# Patient Record
Sex: Female | Born: 1935 | Race: White | Hispanic: No | Smoking: Never smoker
Health system: Southern US, Community
[De-identification: ages and names within clinical notes are randomized; demographics above are authoritative.]

## PROBLEM LIST (undated history)

## (undated) DIAGNOSIS — N3941 Urge incontinence: Secondary | ICD-10-CM

## (undated) DIAGNOSIS — N183 Chronic kidney disease, stage 3 (moderate): Secondary | ICD-10-CM

## (undated) DIAGNOSIS — I10 Essential (primary) hypertension: Secondary | ICD-10-CM

## (undated) DIAGNOSIS — E7801 Familial hypercholesterolemia: Secondary | ICD-10-CM

## (undated) DIAGNOSIS — M2550 Pain in unspecified joint: Secondary | ICD-10-CM

## (undated) DIAGNOSIS — I350 Nonrheumatic aortic (valve) stenosis: Secondary | ICD-10-CM

## (undated) DIAGNOSIS — R011 Cardiac murmur, unspecified: Secondary | ICD-10-CM

## (undated) DIAGNOSIS — E039 Hypothyroidism, unspecified: Secondary | ICD-10-CM

## (undated) DIAGNOSIS — M3500 Sicca syndrome, unspecified: Secondary | ICD-10-CM

## (undated) DIAGNOSIS — K219 Gastro-esophageal reflux disease without esophagitis: Secondary | ICD-10-CM

## (undated) DIAGNOSIS — Z9181 History of falling: Secondary | ICD-10-CM

## (undated) DIAGNOSIS — M5430 Sciatica, unspecified side: Secondary | ICD-10-CM

## (undated) DIAGNOSIS — D696 Thrombocytopenia, unspecified: Secondary | ICD-10-CM

## (undated) DIAGNOSIS — R05 Cough: Secondary | ICD-10-CM

## (undated) DIAGNOSIS — E079 Disorder of thyroid, unspecified: Secondary | ICD-10-CM

## (undated) DIAGNOSIS — M8580 Other specified disorders of bone density and structure, unspecified site: Secondary | ICD-10-CM

## (undated) DIAGNOSIS — C884 Extranodal marginal zone B-cell lymphoma of mucosa-associated lymphoid tissue [MALT-lymphoma]: Secondary | ICD-10-CM

## (undated) DIAGNOSIS — E785 Hyperlipidemia, unspecified: Secondary | ICD-10-CM

## (undated) DIAGNOSIS — D61818 Other pancytopenia: Secondary | ICD-10-CM

## (undated) HISTORY — DX: Sciatica, unspecified side: M54.30

## (undated) HISTORY — PX: LUMBAR DISC SURGERY: SHX700

## (undated) HISTORY — DX: Pain in unspecified joint: M25.50

## (undated) HISTORY — DX: Essential (primary) hypertension: I10

## (undated) HISTORY — DX: History of falling: Z91.81

## (undated) HISTORY — DX: Nonrheumatic aortic (valve) stenosis: I35.0

## (undated) HISTORY — DX: Thrombocytopenia, unspecified: D69.6

## (undated) HISTORY — PX: TONSILLECTOMY: SUR1361

## (undated) HISTORY — DX: Urge incontinence: N39.41

## (undated) HISTORY — DX: Hyperlipidemia, unspecified: E78.5

## (undated) HISTORY — DX: Other specified disorders of bone density and structure, unspecified site: M85.80

## (undated) HISTORY — PX: APPENDECTOMY: SHX54

## (undated) HISTORY — DX: Extranodal marginal zone B-cell lymphoma of mucosa-associated lymphoid tissue (MALT-lymphoma): C88.4

## (undated) HISTORY — DX: Gastro-esophageal reflux disease without esophagitis: K21.9

## (undated) HISTORY — DX: Hypothyroidism, unspecified: E03.9

## (undated) HISTORY — DX: Cough: R05

## (undated) HISTORY — DX: Chronic kidney disease, stage 3 (moderate): N18.3

## (undated) HISTORY — DX: Other pancytopenia: D61.818

## (undated) HISTORY — PX: ABDOMINAL HYSTERECTOMY: SHX81

## (undated) HISTORY — DX: Familial hypercholesterolemia: E78.01

---

## 1998-02-05 ENCOUNTER — Ambulatory Visit (HOSPITAL_COMMUNITY): Admission: RE | Admit: 1998-02-05 | Discharge: 1998-02-05 | Payer: Self-pay | Admitting: Ophthalmology

## 1998-02-05 ENCOUNTER — Encounter: Payer: Self-pay | Admitting: Ophthalmology

## 2006-06-26 ENCOUNTER — Ambulatory Visit: Payer: Self-pay | Admitting: Emergency Medicine

## 2006-07-01 ENCOUNTER — Ambulatory Visit: Payer: Self-pay | Admitting: Cardiovascular Disease

## 2006-08-03 ENCOUNTER — Ambulatory Visit: Payer: Self-pay | Admitting: Internal Medicine

## 2006-08-03 LAB — CONVERTED CEMR LAB
ALT: 11 units/L (ref 0–40)
Bilirubin, Direct: 0.1 mg/dL (ref 0.0–0.3)
Calcium: 9.7 mg/dL (ref 8.4–10.5)
Cortisol, Plasma: 13.5 ug/dL
GFR calc Af Amer: 70 mL/min
GFR calc non Af Amer: 58 mL/min
Glucose, Bld: 91 mg/dL (ref 70–99)
Hgb A1c MFr Bld: 5.4 % (ref 4.6–6.0)
Potassium: 4.8 meq/L (ref 3.5–5.1)
TSH: 3.92 microintl units/mL (ref 0.35–5.50)
Vitamin B-12: 951 pg/mL — ABNORMAL HIGH (ref 211–911)

## 2006-09-07 ENCOUNTER — Ambulatory Visit: Payer: Self-pay | Admitting: Internal Medicine

## 2006-12-09 ENCOUNTER — Ambulatory Visit: Payer: Self-pay | Admitting: Internal Medicine

## 2007-07-15 ENCOUNTER — Encounter: Admission: RE | Admit: 2007-07-15 | Discharge: 2007-07-15 | Payer: Self-pay | Admitting: Orthopedic Surgery

## 2008-07-26 ENCOUNTER — Encounter: Admission: RE | Admit: 2008-07-26 | Discharge: 2008-07-26 | Payer: Self-pay | Admitting: Family Medicine

## 2008-11-15 ENCOUNTER — Encounter: Admission: RE | Admit: 2008-11-15 | Discharge: 2008-11-15 | Payer: Self-pay | Admitting: Otolaryngology

## 2009-08-10 ENCOUNTER — Encounter: Admission: RE | Admit: 2009-08-10 | Discharge: 2009-08-10 | Payer: Self-pay | Admitting: Otolaryngology

## 2010-05-19 ENCOUNTER — Encounter: Payer: Self-pay | Admitting: Otolaryngology

## 2010-09-13 NOTE — Assessment & Plan Note (Signed)
[facility]                             PULMONARY OFFICE NOTE   SHANESSA, HODAK                      MRN:          [id number]  DATE:06/26/2006                            DOB:          1930-06-13    REASON FOR CONSULTATION:  This is a self-referral by Mrs. Oley to  review an abnormal CT scan of the chest.   BRIEF HISTORY:  Ms. Lizana is a 77 year old woman with a history of  non-Hodgkin's lymphoma, first diagnosed in 2001, and in remission  following treatment.  She also has a history of palpitations.  She tells  me that she has been evaluated by Dr. Gildardo Griffes for right flank and  back pain.  She also has had more difficulty with her palpitations,  beginning about a month ago, and part of her evaluation for this  included a CT scan of her chest.  This was done at [facility]  in [facility] and is available for review today.  There were scattered  areas of right mid-lung and right basilar scar with a slightly prominent  interstitium, no dominant lung nodules were identified.  There was some  peripheral lower lobe mild air space disease that could be consistent  with mild interstitial prominence or atelectasis.  She tells me that she  is not having any pulmonary symptoms.  She is very active, has no  breathing limitations, although her back pain can sometimes be limiting.  She denies any constitutional symptoms, including fevers, chills, weight  change.  She does not have cough.   PAST MEDICAL HISTORY:  1. Palpitations.  2. Non-Hodgkin's lymphoma, diagnosed in 2001 and in remission      currently.  3. Lumbar spine surgery in 1971.  4. Right-sided flank and back pain.   ALLERGIES:  No known drug allergies.   CURRENT MEDICATIONS:  1. Premarin daily.  2. Multivitamin once daily.   SOCIAL HISTORY:  The patient is divorced and lives by herself.  She owns  a cat.  She has owned dogs in the past, but has never owned birds.   She  formerly worked for the [employer] and was an Designer, television/film set.  She  also has been an Print production planner.  She denies any significant  occupational exposures.  She is a never-smoker and does not use alcohol.   FAMILY HISTORY:  Significant for cervical cancer in her maternal aunt.   REVIEW OF SYSTEMS:  As per the HPI.   PHYSICAL EXAM:  IN GENERAL:  This is a very pleasant, elderly woman, who  is in no distress on room air.  Weight 148 pounds, temperature 97.8,  blood pressure 112/62, heart rate 69, spO2 96% on room air.  HEENT EXAM:  The oropharynx is benign.  There is no posterior pharyngeal  erythema.  NECK:  The neck is supple without lymphadenopathy or stridor.  LUNGS:  Clear to auscultation bilaterally.  There is no wheezing on a  forced expiration.  HEART:  Has a regular rate and rhythm without murmur.  ABDOMEN:  Soft, nontender, nondistended.  Positive bowel sounds.  EXTREMITIES:  The extremities have no cyanosis, clubbing or edema.  NEUROLOGIC:  She has a grossly nonfocal exam.   The CT scan of the chest is available for review and is as detailed  above.   IMPRESSION:  Mild peripheral air space disease, possibly consistent with  atelectasis or scarring and some mild interstitial prominence on CT  scan, of unclear significance.  The patient is completely asymptomatic.   PLAN:  Given Ms. Burkemper's history of lymphoma, I believe that we do  need to confirm stability of these changes on her CAT scan in the  unlikely event that this represents lymphangitic spread.  I will perform  CT scan in six months to insure stability.  Also, we will try to obtain  her old films from 2005.  If her changes were present at that time, then  this would be very reassuring.  I will follow with Ms. Pires in six  months, after her CT is completed, or sooner should she have any  difficulty.     Leslye Peer, MD  Electronically Signed    RSB/MedQ  DD: 06/26/2006  DT: 06/26/2006   Job #: [id number]   cc:   Ivor Reining, Dr.  Kathie Rhodes, Ms.

## 2010-09-13 NOTE — Assessment & Plan Note (Signed)
[facility]                           PRIMARY CARE OFFICE NOTE   SHAYNE, DEERMAN                      MRN:          [id number]  DATE:08/03/2006                            DOB:          1930/11/07    CHIEF COMPLAINT:  New patient to practice.   HISTORY OF PRESENT ILLNESS:  The patient is a 77 year old female here to  establish primary care. She was formerly followed by Dr. Lawerance Bach in [place], [place]. She has recently seen several of our specialists,  first Dr. Delton Coombes in late February 2008 to review an abnormal CT of the  chest. She is reported to have non-Hodgkin's lymphoma  diagnosed in 2001  and currently on admission she is noted to have some mild peripheral air  space disease possibly consistent with atelectasis, scarring or some  mild interstitial prominence on CT scan of an unclear significance. The  patient was completely asymptomatic and Dr. Delton Coombes recommended followup  with a CAT scan in approximately 6 months.   She also saw Dr. Veverly Fells. Cooper in March 2008 secondary to  palpitations that have been ongoing x2 or 3 weeks associated with some  low back pain. She saw a specialist in [facility] who performed a stress  echo which noted normal contractile reserve, no cavity dilation noted  after exercise and left ventricular ejection fraction was noted to be  approximately 70%. She also had a Holter monitor which noted rare PACs  and PVCs, no arrhythmia were noted. The patient reported multiple  skipped beats and heart racing and most of them corresponding to normal  sinus rhythm was noted on Holter monitor.   One of her complaints today has been somewhat low blood pressure. She  also complains of some fatigue issues, blood pressure symptoms including  light-headedness especially associated with sitting to standing.   PAST MEDICAL HISTORY:  1. Non-Hodgkin's lymphoma  2001 status post treatment, currently in      remission.  2. History of chronic back pain status post L4-5 fusion.  3. History of tonsillectomy.  4. History of hysterectomy.  5. History of appendectomy.  6. History of parotid gland surgery.   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 She has one daughter that lives in  [place]. She never smoked or drank any alcohol.   FAMILY HISTORY:  No coronary artery disease. There is a brother who had  AAA repair in his mid 62s.   REVIEW OF SYSTEMS:  No fevers or chills. She is unsure whether she was  treated with steroids as part of her treatment for non-Hodgkin's  lymphoma.  No HEENT symptoms. The patient denies any chest pain or  shortness of breath, chronic constipation, some issues with bowel  incontinence in the past. Some issues with memory.   PHYSICAL EXAMINATION:  VITAL SIGNS:  Weight is 148.4 pounds, temperature  is 97, pulse is 63, blood pressure is 101/57 in sitting position and  drops to 90/50 with standing.  GENERAL:  The patient is a pleasant, well-developed, well-nourished,  54-  year-old, white female who appears her stated age.  HEENT:  Normocephalic, atraumatic. Pupils equal and reactive to light  bilaterally. Extraocular motility was intact. The patient was anicteric.  Conjunctiva was within normal limits. External auditory canals and  tympanic membranes were clear bilaterally. Oropharyngeal exam revealed  partial upper dental plate and full lower dental plate.  NECK:  Supple, no adenopathy, carotid bruit or thyromegaly.  CHEST:  Normal expiratory effort. Chest was clear to auscultation  bilaterally.  CARDIOVASCULAR:  Regular rate and rhythm. No significant murmurs, rubs  or gallops appreciated.  ABDOMEN:  Soft, nontender, positive bowel sounds. No organomegaly.  MUSCULOSKELETAL:  No clubbing, cyanosis or edema. The patient had intact  dorsalis pedis pulses.   IMPRESSION/RECOMMENDATIONS:  1. Fatigue with mild  orthostasis of unclear significance.  2. History of non-Hodgkin's lymphoma.  3. Palpitations.  4. History of chronic low back pain with L4-5 fusion.  5. Decreased memory.  6. Health maintenance.   RECOMMENDATIONS:  I will try to obtain records from her previous  oncologist. I am sure a CAT scan of her abdomen and pelvis has been  performed in the past. There is no note of AAA on recent CT scan of  chest.   In terms of her mild hypotension, I recommended liberal salt intake. She  is currently on a low salt diet and one consideration is possible  adrenal insufficiency to be further considered on followup.   In terms of her memory issues, I added a B12, folate and TSH to her labs  and if the patient continues to have memory issues, we will consider MRI  of the brain.   FOLLOWUP:  Approximately 4-6 weeks.     Barbette Hair. Artist Pais, DO  Electronically Signed    RDY/MedQ  DD: 08/03/2006  DT: 08/03/2006  Job #: [id number]

## 2010-09-13 NOTE — Assessment & Plan Note (Signed)
[facility]                            CARDIOLOGY OFFICE NOTE   CHELBIE, JARNAGIN                      MRN:          [id number]  DATE:07/01/2006                            DOB:          [birth year]    Barrett Henle presents in self-referral for evaluation of  palpitations.   HISTORY OF PRESENT ILLNESS:  Mrs. Denison is a 77 year old woman who  has no history of cardiac disease.  She describes an episode  approximately 2-3 weeks ago where she had shooting pains in her low back  area.  She associated with this cardiac palpitations.  She also was able  to hear her heartbeat in her ear.  Her palpitations were bothersome over  a one week period and have now resolved over the last two weeks.  She  has no other symptoms that she complains of.  She specifically denies  chest pain, dyspnea, lightheadedness, syncope, orthopnea or PND.  She  has no edema or calf claudication symptoms.  She is a very active woman  but is not engaged in any formal exercise.  She had no other associated  symptoms with her palpitations.   PAST MEDICAL HISTORY:  1. Non-Hodgkin's lymphoma status post chemotherapy approximately three      years ago.  She has been in remission since that time.  2. Chronic back pain, status post L4/L5 fusion.  3. Remote tonsillectomy.  4. Remote appendectomy.  5. Remote hysterectomy.  6. Parotid gland surgery.   There is no history of hypertension, diabetes, dyslipidemia or tobacco  abuse.   SOCIAL HISTORY:  The patient is retired from office work.  She worked  for [employer] and [employer].  She is divorced.  She has never smoked  cigarettes or drank alcohol.   FAMILY HISTORY:  There is a history of longevity in the family.  Her  father died at age 58 of old age and her mother died at age 39 of old  age.  She has a brother who had an abdominal aortic aneurysm repaired in  his mid 68's.   REVIEW OF SYSTEMS:  A complete 12-point review of  systems was performed.  Pertinent positive included only constipation and low back pain.  All  other systems were reviewed and are negative except as detailed.   PHYSICAL EXAMINATION:  GENERAL:  The patient is alert and oriented.  She  is in no acute distress.  VITAL SIGNS:  Weight 149 pounds, blood pressure 102/68, heart rate 64,  respirations 12.  HEENT:  Normal.  NECK:  Normal carotid upstrokes without bruits.  Jugular venous pressure  is normal.  No thyromegaly or thyroid nodules.  LUNGS:  Clear to auscultation bilaterally.  HEART:  The apex is discrete and nondisplaced.  There is no right  ventricular heave.  Regular rate and rhythm without murmurs or gallops.  ABDOMEN:  Soft, nontender, no organomegaly.  No abdominal bruits.  BACK:  There is no flank tenderness.  There is tenderness to palpation  over the lumbar spine.  EXTREMITIES:  No cyanosis, clubbing or edema.  Peripheral pulses are 2+  and  equal throughout.  SKIN:  Warm and dry without rash.  NEUROLOGICAL:  Cranial nerves II-XII are intact.  Strength is 5/5 and  equal in the arms and leg bilaterally.   STUDIES:  EKG shows normal sinus rhythm and is within normal limits.   Studies reviewed include an exercise stress echo from May 18, 2006.  This demonstrated normal lung contractile reserved for the left  ventricle with no cavity dilatation present.  LVEF with exercise was  70%.  Mrs. Walthour exercised for 5 minutes on the modified Bruce  protocol achieving 89% of her maximum predicted heart rate.  She had no  symptoms and the study was negative for ischemia.   A 24-hour Holter monitor, also performed on May 14, 2006,  demonstrated rare single PVC's as well as rare PAC's.  There were no  brady arrhythmias or significant arrhythmias.  Multiple symptoms were  recorded, of which most correlated to normal sinus rhythm.  Some  correlated with PAC's.   ASSESSMENT:  Mrs. Majette is a 77 year old woman with  palpitations  that have now mostly resolved.  She has been evaluated with both an  exercise stress echo as well as 24-hour Holter monitoring.  I think her  risk of having significant coronary artery disease is quite low with her  lack of cardiac risk factors as well as her lack of symptoms.  I also  think that her chance for having any serious heart rhythm problems is  low.  If she develops progressive palpitations, we could consider a  trial of low-dose beta blockade, but at this point with her improvement  in symptoms, I do not think any treatment is necessary.  I will ask her  to come back on a p.r.n. basis, and would be happy to see her if she  develops problems in the future.  She is going to establish care with  Dr. Artist Pais in the near future and I am sure he will check her fasting  lipids along with her other basic blood work.     Veverly Fells. Excell Seltzer, MD  Electronically Signed    MDC/MedQ  DD: 07/01/2006  DT: 07/01/2006  Job #: [id number]   cc:   Ivor Reining, M.D.  Barbette Hair. Artist Pais, DO  Leslye Peer, MD

## 2014-02-06 ENCOUNTER — Other Ambulatory Visit: Payer: Self-pay | Admitting: Orthopedic Surgery

## 2014-02-06 DIAGNOSIS — M48061 Spinal stenosis, lumbar region without neurogenic claudication: Secondary | ICD-10-CM

## 2014-02-06 DIAGNOSIS — M5416 Radiculopathy, lumbar region: Secondary | ICD-10-CM

## 2014-10-12 ENCOUNTER — Ambulatory Visit: Payer: Self-pay | Admitting: Cardiology

## 2014-10-26 ENCOUNTER — Ambulatory Visit: Payer: Self-pay | Admitting: Internal Medicine

## 2015-04-09 DIAGNOSIS — N183 Chronic kidney disease, stage 3 unspecified: Secondary | ICD-10-CM | POA: Insufficient documentation

## 2015-04-09 DIAGNOSIS — K219 Gastro-esophageal reflux disease without esophagitis: Secondary | ICD-10-CM

## 2015-04-09 DIAGNOSIS — E039 Hypothyroidism, unspecified: Secondary | ICD-10-CM

## 2015-04-09 DIAGNOSIS — E78019 Familial hypercholesterolemia, unspecified: Secondary | ICD-10-CM | POA: Insufficient documentation

## 2015-04-09 DIAGNOSIS — E7801 Familial hypercholesterolemia: Secondary | ICD-10-CM | POA: Insufficient documentation

## 2015-04-09 HISTORY — DX: Gastro-esophageal reflux disease without esophagitis: K21.9

## 2015-04-09 HISTORY — DX: Hypothyroidism, unspecified: E03.9

## 2015-04-09 HISTORY — DX: Chronic kidney disease, stage 3 unspecified: N18.30

## 2015-08-17 ENCOUNTER — Other Ambulatory Visit: Payer: Self-pay | Admitting: Orthopedic Surgery

## 2015-08-17 DIAGNOSIS — M48061 Spinal stenosis, lumbar region without neurogenic claudication: Secondary | ICD-10-CM

## 2015-08-24 ENCOUNTER — Other Ambulatory Visit: Payer: Self-pay | Admitting: Orthopedic Surgery

## 2015-08-24 ENCOUNTER — Ambulatory Visit
Admission: RE | Admit: 2015-08-24 | Discharge: 2015-08-24 | Disposition: A | Discharge status: Home / Self Care | Payer: Self-pay | Source: Ambulatory Visit | Attending: Orthopedic Surgery | Admitting: Orthopedic Surgery

## 2015-08-24 ENCOUNTER — Ambulatory Visit
Admission: RE | Admit: 2015-08-24 | Discharge: 2015-08-24 | Disposition: A | Discharge status: Home / Self Care | Payer: Medicare Other | Source: Ambulatory Visit | Attending: Orthopedic Surgery | Admitting: Orthopedic Surgery

## 2015-08-24 DIAGNOSIS — M48061 Spinal stenosis, lumbar region without neurogenic claudication: Secondary | ICD-10-CM

## 2015-08-24 MED ORDER — IOPAMIDOL (ISOVUE-M 200) INJECTION 41%
18.0000 mL | Freq: Once | INTRAMUSCULAR | Status: AC
  Administered 2015-08-24: 18 mL via INTRATHECAL

## 2015-08-24 NOTE — Progress Notes (Signed)
Pt states she has been off Tramadol for the past 2 days. 

## 2015-08-24 NOTE — Discharge Instructions (Signed)
Myelogram Discharge Instructions  1. Go home and rest quietly for the next 24 hours.  It is important to lie flat for the next 24 hours.  Get up only to go to the restroom.  You may lie in the bed or on a couch on your back, your stomach, your left side or your right side.  You may have one pillow under your head.  You may have pillows between your knees while you are on your side or under your knees while you are on your back.  2. DO NOT drive today.  Recline the seat as far back as it will go, while still wearing your seat belt, on the way home.  3. You may get up to go to the bathroom as needed.  You may sit up for 10 minutes to eat.  You may resume your normal diet and medications unless otherwise indicated.  Drink lots of extra fluids today and tomorrow.  4. The incidence of headache, nausea, or vomiting is about 5% (one in 20 patients).  If you develop a headache, lie flat and drink plenty of fluids until the headache goes away.  Caffeinated beverages may be helpful.  If you develop severe nausea and vomiting or a headache that does not go away with flat bed rest, call [phone].  5. You may resume normal activities after your 24 hours of bed rest is over; however, do not exert yourself strongly or do any heavy lifting tomorrow. If when you get up you have a headache when standing, go back to bed and force fluids for another 24 hours.  6. Call your physician for a follow-up appointment.  The results of your myelogram will be sent directly to your physician by the following day.  7. If you have any questions or if complications develop after you arrive home, please call [phone].  Discharge instructions have been explained to the patient.  The patient, or the person responsible for the patient, fully understands these instructions.       May resume Tramadol on August 25, 2015, after 1:00 pm.

## 2015-08-25 ENCOUNTER — Emergency Department (HOSPITAL_COMMUNITY)
Admission: EM | Admit: 2015-08-25 | Discharge: 2015-08-25 | Disposition: A | Discharge status: Home / Self Care | Payer: Medicare Other | Attending: Emergency Medicine | Admitting: Emergency Medicine

## 2015-08-25 ENCOUNTER — Encounter (HOSPITAL_COMMUNITY): Payer: Self-pay

## 2015-08-25 ENCOUNTER — Emergency Department (HOSPITAL_COMMUNITY): Payer: Medicare Other

## 2015-08-25 DIAGNOSIS — M5489 Other dorsalgia: Secondary | ICD-10-CM

## 2015-08-25 DIAGNOSIS — R55 Syncope and collapse: Secondary | ICD-10-CM | POA: Diagnosis not present

## 2015-08-25 DIAGNOSIS — R011 Cardiac murmur, unspecified: Secondary | ICD-10-CM | POA: Insufficient documentation

## 2015-08-25 DIAGNOSIS — Z8639 Personal history of other endocrine, nutritional and metabolic disease: Secondary | ICD-10-CM | POA: Insufficient documentation

## 2015-08-25 DIAGNOSIS — M545 Low back pain: Secondary | ICD-10-CM | POA: Diagnosis present

## 2015-08-25 HISTORY — DX: Disorder of thyroid, unspecified: E07.9

## 2015-08-25 HISTORY — DX: Cardiac murmur, unspecified: R01.1

## 2015-08-25 HISTORY — DX: Sjogren syndrome, unspecified: M35.00

## 2015-08-25 LAB — CBC WITH DIFFERENTIAL/PLATELET
BASOS ABS: 0 10*3/uL (ref 0.0–0.1)
BASOS PCT: 0 %
Eosinophils Absolute: 0 10*3/uL (ref 0.0–0.7)
Eosinophils Relative: 0 %
HEMATOCRIT: 38.7 % (ref 36.0–46.0)
HEMOGLOBIN: 13 g/dL (ref 12.0–15.0)
LYMPHS PCT: 11 %
Lymphs Abs: 0.6 10*3/uL — ABNORMAL LOW (ref 0.7–4.0)
MCH: 31.5 pg (ref 26.0–34.0)
MCHC: 33.6 g/dL (ref 30.0–36.0)
MCV: 93.7 fL (ref 78.0–100.0)
Monocytes Absolute: 0.4 10*3/uL (ref 0.1–1.0)
Monocytes Relative: 8 %
NEUTROS ABS: 4.5 10*3/uL (ref 1.7–7.7)
NEUTROS PCT: 81 %
Platelets: 134 10*3/uL — ABNORMAL LOW (ref 150–400)
RBC: 4.13 MIL/uL (ref 3.87–5.11)
RDW: 12.4 % (ref 11.5–15.5)
WBC: 5.6 10*3/uL (ref 4.0–10.5)

## 2015-08-25 LAB — URINALYSIS, ROUTINE W REFLEX MICROSCOPIC
Bilirubin Urine: NEGATIVE
GLUCOSE, UA: NEGATIVE mg/dL
Hgb urine dipstick: NEGATIVE
KETONES UR: 15 mg/dL — AB
NITRITE: NEGATIVE
PH: 8 (ref 5.0–8.0)
PROTEIN: NEGATIVE mg/dL
Specific Gravity, Urine: 1.011 (ref 1.005–1.030)

## 2015-08-25 LAB — BASIC METABOLIC PANEL
ANION GAP: 11 (ref 5–15)
BUN: 5 mg/dL — ABNORMAL LOW (ref 6–20)
CALCIUM: 9.1 mg/dL (ref 8.9–10.3)
CO2: 23 mmol/L (ref 22–32)
Chloride: 101 mmol/L (ref 101–111)
Creatinine, Ser: 0.86 mg/dL (ref 0.44–1.00)
GFR calc non Af Amer: >60 mL/min (ref >60)
GLUCOSE: 103 mg/dL — AB (ref 65–99)
POTASSIUM: 3.8 mmol/L (ref 3.5–5.1)
Sodium: 135 mmol/L (ref 135–145)

## 2015-08-25 LAB — URINE MICROSCOPIC-ADD ON
Bacteria, UA: NONE SEEN
RBC / HPF: NONE SEEN RBC/hpf (ref 0–5)

## 2015-08-25 MED ORDER — MORPHINE SULFATE (PF) 2 MG/ML IV SOLN
2.0000 mg | Freq: Once | INTRAVENOUS | Status: AC
  Administered 2015-08-25: 2 mg via INTRAVENOUS
  Filled 2015-08-25: qty 1

## 2015-08-25 MED ORDER — TRAMADOL HCL 50 MG PO TABS
50.0000 mg | ORAL_TABLET | Freq: Once | ORAL | Status: AC
  Administered 2015-08-25: 50 mg via ORAL
  Filled 2015-08-25: qty 1

## 2015-08-25 MED ORDER — ALPRAZOLAM 0.25 MG PO TABS: 0.2500 mg | ORAL_TABLET | Freq: Every evening | ORAL | Status: DC | PRN

## 2015-08-25 MED ORDER — SODIUM CHLORIDE 0.9 % IV SOLN
INTRAVENOUS | Status: DC
  Administered 2015-08-25: 10:00:00 via INTRAVENOUS

## 2015-08-25 MED ORDER — SODIUM CHLORIDE 0.9 % IV BOLUS (SEPSIS)
1000.0000 mL | Freq: Once | INTRAVENOUS | Status: AC
  Administered 2015-08-25: 1000 mL via INTRAVENOUS

## 2015-08-25 NOTE — ED Notes (Signed)
Pt. Asking for 50 mg tramadol that she takes at home for pain. Verbal order given by EDP.

## 2015-08-25 NOTE — ED Notes (Signed)
Pt. Coming from home via [facility] for near syncope. Pt. Had CT exam/spinal tap yesterday for ongoing lower back pain. Pt. Told to stay flat until noon today. Pt. sts she has been unable to eat or drink and everytime she goes to sit up, she almost passes out. Pt. Hx of thyroid dysfunction, sjogerns syndrome, and heart murmur. Pt. sts she feels like her ribs are broken from procedure yesterday. Pt. Rates pain 10/10. EMS reports 12-lead as unremarkable. Pt. Denies HA. Pt. Aox4.

## 2015-08-25 NOTE — ED Notes (Signed)
EDP at bedside  

## 2015-08-25 NOTE — ED Provider Notes (Signed)
CSN: [id number]     Arrival date & time 08/25/15  [id number] History   First MD Initiated Contact with Patient 08/25/15 [phone]     Chief Complaint  Patient presents with  . Near Syncope     (Consider location/radiation/quality/duration/timing/severity/associated sxs/prior Treatment) HPI Comments: Patient here after having near syncopal event while trying to go to the bathroom this morning. Patient had a abdominal CT with a lumbar puncture done yesterday due to ongoing back pain. Was told to lay flat and turning today. States that she has been unable to eat or drink for the past 24 hours. She stood up she felt dizzy lightheaded normals passed out. Denies any bowel or bladder function. No associated chest or abdominal discomfort. Pain is worse at her right flank and she denies any dysuria or hematuria. Pain is characterized as 10 of 10 and worse with movement better with rest. EMS called and patient transported here.  Patient is a 82 y.o. female presenting with near-syncope. The history is provided by the patient.  Near Syncope    Past Medical History  Diagnosis Date  . Sjogren's syndrome ([place])   . Thyroid disease   . Heart murmur    Past Surgical History  Procedure Laterality Date  . Tonsillectomy    . Appendectomy    . Abdominal hysterectomy    . Lumbar disc surgery     History reviewed. No pertinent family history. Social History  Substance Use Topics  . Smoking status: Never Smoker   . Smokeless tobacco: None  . Alcohol Use: No   OB History    Gravida Para Term Preterm AB TAB SAB Ectopic Multiple Living            1     Review of Systems  Cardiovascular: Positive for near-syncope.  All other systems reviewed and are negative.     Allergies  Review of patient's allergies indicates no known allergies.  Home Medications   Prior to Admission medications   Not on File   BP 141/56 mmHg  Pulse 64  Temp(Src) 98.1 F (36.7 C) (Oral)  Resp 16  Ht 5\' 7"  (1.702 m)  Wt  52.164 kg  BMI 18.01 kg/m2  SpO2 99% Physical Exam  Constitutional: She is oriented to person, place, and time. She appears well-developed and well-nourished.  Non-toxic appearance. No distress.  HENT:  Head: Normocephalic and atraumatic.  Eyes: Conjunctivae, EOM and lids are normal. Pupils are equal, round, and reactive to light.  Neck: Normal range of motion. Neck supple. No tracheal deviation present. No thyroid mass present.  Cardiovascular: Normal rate, regular rhythm and normal heart sounds.  Exam reveals no gallop.   No murmur heard. Pulmonary/Chest: Effort normal and breath sounds normal. No stridor. No respiratory distress. She has no decreased breath sounds. She has no wheezes. She has no rhonchi. She has no rales.  Abdominal: Soft. Normal appearance and bowel sounds are normal. She exhibits no distension. There is no tenderness. There is no rebound and no CVA tenderness.  Musculoskeletal: Normal range of motion. She exhibits no edema or tenderness.       Back:  Neurological: She is alert and oriented to person, place, and time. She has normal strength. No cranial nerve deficit or sensory deficit. GCS eye subscore is 4. GCS verbal subscore is 5. GCS motor subscore is 6.  Reflex Scores:      Patellar reflexes are 3+ on the right side and 3+ on the left side. Skin: Skin is warm  and dry. No abrasion and no rash noted.  Psychiatric: She has a normal mood and affect. Her speech is normal and behavior is normal.  Nursing note and vitals reviewed.   ED Course  Procedures (including critical care time) Labs Review Labs Reviewed  CBC WITH DIFFERENTIAL/PLATELET  BASIC METABOLIC PANEL  URINALYSIS, ROUTINE W REFLEX MICROSCOPIC (NOT AT [facility])    Imaging Review Ct Lumbar Spine W Contrast  08/24/2015  CLINICAL DATA:  Severe lumbosacral pain when lying down. Symptoms have worsened recently. EXAM: LUMBAR MYELOGRAM FLUOROSCOPY TIME:  0 minutes 51 seconds. 119.01 micro gray meter squared  PROCEDURE: After thorough discussion of risks and benefits of the procedure including bleeding, infection, injury to nerves, blood vessels, adjacent structures as well as headache and CSF leak, written and oral informed consent was obtained. Consent was obtained by Dr. Nelson Chimes. Time out form was completed. Patient was positioned prone on the fluoroscopy table. Local anesthesia was provided with 1% lidocaine without epinephrine after prepped and draped in the usual sterile fashion. Puncture was performed at L5-S1 using a 3 1/2 inch 22-gauge spinal needle via right para median approach. Using a single pass through the dura, the needle was placed within the thecal sac, with return of clear CSF. 15 mL of Isovue 200 was injected into the thecal sac, with normal opacification of the nerve roots and cauda equina consistent with free flow within the subarachnoid space. I personally performed the lumbar puncture and administered the intrathecal contrast. I also personally performed acquisition of the myelogram images. TECHNIQUE: Contiguous axial images were obtained through the Lumbar spine after the intrathecal infusion of infusion. Coronal and sagittal reconstructions were obtained of the axial image sets. COMPARISON:  MRI 02/27/2014 FINDINGS: LUMBAR MYELOGRAM FINDINGS: There is no compressive central canal stenosis. There is mild curvature convex to the left. There is narrowing of the lateral recesses at L4-5, left more than right. Small anterior extradural defect at that level. Standing flexion extension views do not show any abnormal motion. CT LUMBAR MYELOGRAM FINDINGS: Mild curvature convex to the left. There is chronic renal atrophy on the left with chronic hydroureteronephrosis. Distal ureter and bladder are not image. Right kidney appears normal without contrast. There is atherosclerosis of the aorta with mild aneurysmal dilatation, maximal diameter 2.8 cm. T12-L1: Bulging of the disc indents the ventral  subarachnoid space. No stenosis or neural compression. L1-2: Bulging of the disc indents the ventral aspect of the thecal sac. No stenosis or neural compression. Conus tip at lower L2. L2-3:  Normal interspace. L3-4: Mild bulging of the disc. Mild facet and ligamentous hypertrophy. Mild narrowing of the lateral recesses without visible neural compression. L4-5: Disc degeneration with loss height. Mild circumferential bulging of the disc. Herniation of some disc material and nitrogen gas into the foramen on the right that could focally affect the right L4 nerve root. Mild facet and ligamentous hypertrophy. L5-S1: Previous right hemilaminectomy and discectomy. Endplate osteophytes and bulging of the disc. Mild facet hypertrophy. No compressive narrowing of the canal or foramina. IMPRESSION: Mild curvature convex to the left. No likely significant finding at L1-2, L2-3, L3-4 or L5-S1. Previous right hemilaminectomy at L5-S1. At L4-5, there is disc space narrowing. There is extrusion of disc material and some nitrogen gas into the right foraminal to extra foraminal region that could focally affect the right L4 nerve root. This was present on the previous MRI but is slightly more prominent presently. Chronic left renal atrophy with hydroureteronephrosis. Infrarenal abdominal aortic ectasia maximal  transverse diameter 2.8 cm. Ectatic abdominal aorta at risk for aneurysm development. Recommend followup by ultrasound in 5 years. This recommendation follows ACR consensus guidelines: White Paper of the ACR Incidental Findings Committee II on Vascular Findings. J Am Coll Radiol 2013; 10:789-794. Electronically Signed   By: Nelson Chimes M.D.   On: 08/24/2015 14:00   Dg Myelography Lumbar Inj Lumbosacral  08/24/2015  CLINICAL DATA:  Severe lumbosacral pain when lying down. Symptoms have worsened recently. EXAM: LUMBAR MYELOGRAM FLUOROSCOPY TIME:  0 minutes 51 seconds. 119.01 micro gray meter squared PROCEDURE: After thorough  discussion of risks and benefits of the procedure including bleeding, infection, injury to nerves, blood vessels, adjacent structures as well as headache and CSF leak, written and oral informed consent was obtained. Consent was obtained by Dr. Nelson Chimes. Time out form was completed. Patient was positioned prone on the fluoroscopy table. Local anesthesia was provided with 1% lidocaine without epinephrine after prepped and draped in the usual sterile fashion. Puncture was performed at L5-S1 using a 3 1/2 inch 22-gauge spinal needle via right para median approach. Using a single pass through the dura, the needle was placed within the thecal sac, with return of clear CSF. 15 mL of Isovue 200 was injected into the thecal sac, with normal opacification of the nerve roots and cauda equina consistent with free flow within the subarachnoid space. I personally performed the lumbar puncture and administered the intrathecal contrast. I also personally performed acquisition of the myelogram images. TECHNIQUE: Contiguous axial images were obtained through the Lumbar spine after the intrathecal infusion of infusion. Coronal and sagittal reconstructions were obtained of the axial image sets. COMPARISON:  MRI 02/27/2014 FINDINGS: LUMBAR MYELOGRAM FINDINGS: There is no compressive central canal stenosis. There is mild curvature convex to the left. There is narrowing of the lateral recesses at L4-5, left more than right. Small anterior extradural defect at that level. Standing flexion extension views do not show any abnormal motion. CT LUMBAR MYELOGRAM FINDINGS: Mild curvature convex to the left. There is chronic renal atrophy on the left with chronic hydroureteronephrosis. Distal ureter and bladder are not image. Right kidney appears normal without contrast. There is atherosclerosis of the aorta with mild aneurysmal dilatation, maximal diameter 2.8 cm. T12-L1: Bulging of the disc indents the ventral subarachnoid space. No stenosis  or neural compression. L1-2: Bulging of the disc indents the ventral aspect of the thecal sac. No stenosis or neural compression. Conus tip at lower L2. L2-3:  Normal interspace. L3-4: Mild bulging of the disc. Mild facet and ligamentous hypertrophy. Mild narrowing of the lateral recesses without visible neural compression. L4-5: Disc degeneration with loss height. Mild circumferential bulging of the disc. Herniation of some disc material and nitrogen gas into the foramen on the right that could focally affect the right L4 nerve root. Mild facet and ligamentous hypertrophy. L5-S1: Previous right hemilaminectomy and discectomy. Endplate osteophytes and bulging of the disc. Mild facet hypertrophy. No compressive narrowing of the canal or foramina. IMPRESSION: Mild curvature convex to the left. No likely significant finding at L1-2, L2-3, L3-4 or L5-S1. Previous right hemilaminectomy at L5-S1. At L4-5, there is disc space narrowing. There is extrusion of disc material and some nitrogen gas into the right foraminal to extra foraminal region that could focally affect the right L4 nerve root. This was present on the previous MRI but is slightly more prominent presently. Chronic left renal atrophy with hydroureteronephrosis. Infrarenal abdominal aortic ectasia maximal transverse diameter 2.8 cm. Ectatic abdominal aorta at  risk for aneurysm development. Recommend followup by ultrasound in 5 years. This recommendation follows ACR consensus guidelines: White Paper of the ACR Incidental Findings Committee II on Vascular Findings. J Am Coll Radiol 2013; 10:789-794. Electronically Signed   By: Nelson Chimes M.D.   On: 08/24/2015 14:00   I have personally reviewed and evaluated these images and lab results as part of my medical decision-making.   EKG Interpretation None      MDM   Final diagnoses:  None    Patient given pain meds and IV fluids and feels better. CT scan without acute complications from her recent  myelogram. Will prescribe Xanax help her sleep and return precautions given. No focal neurological deficits noted at the time of this exam    Lacretia Leigh, MD 08/25/15 1209

## 2015-08-25 NOTE — ED Notes (Signed)
Hooked Pt back up to monitor. Pt inquiring about whether she can take her home medicines. RN notified.

## 2015-08-25 NOTE — Discharge Instructions (Signed)

## 2015-08-25 NOTE — ED Notes (Signed)
Pt. Sat up to take home meds (with EDP approval). Pt. Denies lightheadedness. Pt. Only given sips of water with meds. Pt. Laid back flat at this time.

## 2015-08-25 NOTE — ED Notes (Signed)
Pt. Transported to CT at this time.  

## 2015-08-25 NOTE — ED Notes (Signed)
Pt. Given food and beverage with EDP approval.

## 2015-08-25 NOTE — ED Notes (Signed)
EDP at bedside explaining discharge instructions.

## 2015-08-28 DIAGNOSIS — M3500 Sicca syndrome, unspecified: Secondary | ICD-10-CM | POA: Insufficient documentation

## 2015-08-28 DIAGNOSIS — I10 Essential (primary) hypertension: Secondary | ICD-10-CM | POA: Insufficient documentation

## 2015-08-28 DIAGNOSIS — M2550 Pain in unspecified joint: Secondary | ICD-10-CM | POA: Insufficient documentation

## 2015-08-28 DIAGNOSIS — R05 Cough: Secondary | ICD-10-CM | POA: Insufficient documentation

## 2015-08-28 DIAGNOSIS — E782 Mixed hyperlipidemia: Secondary | ICD-10-CM | POA: Insufficient documentation

## 2015-08-28 DIAGNOSIS — E785 Hyperlipidemia, unspecified: Secondary | ICD-10-CM | POA: Insufficient documentation

## 2015-08-28 DIAGNOSIS — R059 Cough, unspecified: Secondary | ICD-10-CM | POA: Insufficient documentation

## 2015-08-28 HISTORY — DX: Essential (primary) hypertension: I10

## 2015-08-28 HISTORY — DX: Cough, unspecified: R05.9

## 2015-10-10 DIAGNOSIS — C884 Extranodal marginal zone b-cell lymphoma of mucosa-associated lymphoid tissue (malt-lymphoma) not having achieved remission: Secondary | ICD-10-CM

## 2015-10-10 DIAGNOSIS — D61818 Other pancytopenia: Secondary | ICD-10-CM | POA: Insufficient documentation

## 2015-10-10 HISTORY — DX: Other pancytopenia: D61.818

## 2015-10-10 HISTORY — DX: Extranodal marginal zone B-cell lymphoma of mucosa-associated lymphoid tissue (MALT-lymphoma): C88.4

## 2015-10-10 HISTORY — DX: Extranodal marginal zone b-cell lymphoma of mucosa-associated lymphoid tissue (malt-lymphoma) not having achieved remission: C88.40

## 2016-05-06 DIAGNOSIS — N3941 Urge incontinence: Secondary | ICD-10-CM | POA: Insufficient documentation

## 2016-05-06 HISTORY — DX: Urge incontinence: N39.41

## 2016-07-14 DIAGNOSIS — Z9181 History of falling: Secondary | ICD-10-CM

## 2016-07-14 HISTORY — DX: History of falling: Z91.81

## 2017-03-09 DIAGNOSIS — D696 Thrombocytopenia, unspecified: Secondary | ICD-10-CM | POA: Insufficient documentation

## 2017-06-01 ENCOUNTER — Ambulatory Visit: Payer: Medicare Other | Admitting: Cardiology

## 2017-06-02 ENCOUNTER — Ambulatory Visit: Payer: Medicare Other | Admitting: Cardiology

## 2017-06-02 DIAGNOSIS — M8580 Other specified disorders of bone density and structure, unspecified site: Secondary | ICD-10-CM | POA: Insufficient documentation

## 2017-06-02 DIAGNOSIS — M5430 Sciatica, unspecified side: Secondary | ICD-10-CM

## 2017-06-02 HISTORY — DX: Other specified disorders of bone density and structure, unspecified site: M85.80

## 2017-06-02 HISTORY — DX: Sciatica, unspecified side: M54.30

## 2017-06-02 NOTE — Progress Notes (Signed)
Cardiology Office Note:    Date:  06/04/2017   ID:  Carmen Blackwell, DOB 11-15-1930, MRN [id number]  PCP:  Garwin Brothers, MD  Cardiologist:  Shirlee More, MD   Referring MD: No ref. provider found  ASSESSMENT:    1. Aortic stenosis, moderate   2. Sjogren's syndrome, with unspecified organ involvement ([place])   3. Essential hypertension   4. Non-Hodgkin's lymphoma, unspecified body region, unspecified non-Hodgkin lymphoma type ([place])   5. Familial hypercholesterolemia    PLAN:    In order of problems listed above:  1. By examination she has aortic stenosis asymptomatic at least moderate in severity.  She has had increased risk of cardiac involvement lymphoma although she has not had chest radiation.  I asked her to have a baseline echocardiogram performed in the severity will determine the frequency of subsequent echocardiograms.  If indeed the valve is stenotic will start endocarditis prophylaxis and I will see plan to see back in the office in 6 months or sooner if she develops chest pain shortness of breath or syncope. 2. Stable managed by rheumatology 3. Stable not presently on antihypertensive drug 4. Stable she is managed by oncology 5. Stable continue current treatment LDL is ideal  Next appointment 6 months   Medication Adjustments/Labs and Tests Ordered: Current medicines are reviewed at length with the patient today.  Concerns regarding medicines are outlined above.  Orders Placed This Encounter  Procedures  . EKG 12-Lead  . ECHOCARDIOGRAM COMPLETE   No orders of the defined types were placed in this encounter.    Chief Complaint  Patient presents with  . Follow-up    History of Present Illness:    Carmen Blackwell is a 84 y.o. female with a history of hypertension, hyperlipidemia. Sjogrens syndrome and MALT lymphoma who is being seen today for the evaluation of her cardiovascular risk at her request.  She relates she is noted to have a murmur when she has an urgent  care center and with her lymphoma she was concerned about cardiovascular risk.  She has been treated including Rituxinbut no radiation to the chest and at this time the lymphoma is in remission.  She has had no chest pain shortness of breath or syncope.  She has no background history of congenital or rheumatic heart disease.  She has never had issues like angina or myocardial infarction or heart failure.  She has hyperlipidemia well treated LDL at target.   Past Medical History:  Diagnosis Date  . Acquired hypothyroidism 04/09/2015  . Chronic kidney disease, stage III (moderate) ([place]) 04/09/2015  . Cough 08/28/2015  . Familial hypercholesterolemia 04/09/2015  . GERD (gastroesophageal reflux disease) 04/09/2015  . Heart murmur   . Hyperlipidemia 08/28/2015  . Hypertension 08/28/2015  . MALT lymphoma ([place]) 10/10/2015  . Osteopenia 06/02/2017  . Pain in joints 08/28/2015  . Pancytopenia ([place]) 10/10/2015  . Risk for falls 07/14/2016  . Sciatica 06/02/2017  . Sjogren's syndrome ([place])   . Thrombocytopenia ([place]) 03/09/2017  . Thyroid disease   . Urge incontinence 05/06/2016    Past Surgical History:  Procedure Laterality Date  . ABDOMINAL HYSTERECTOMY    . APPENDECTOMY    . LUMBAR DISC SURGERY    . TONSILLECTOMY      Current Medications: Current Meds  Medication Sig  . Calcium Carb-Cholecalciferol (CALCIUM 600+D3 PO) Take 1 capsule by mouth daily.  . hydroxychloroquine (PLAQUENIL) 200 MG tablet Take 1 tablet by mouth daily.  Marland Kitchen levothyroxine (SYNTHROID, LEVOTHROID) 50 MCG tablet Take 1  tablet by mouth daily.  Marland Kitchen lovastatin (MEVACOR) 20 MG tablet Take 1 tablet by mouth daily.  Marland Kitchen MAGNESIUM GLUCONATE PO Take 400 mg by mouth daily.   . Meclizine HCl 25 MG CHEW Chew 1 tablet by mouth as needed.  . mirabegron ER (MYRBETRIQ) 25 MG TB24 tablet Take 25 mg by mouth daily.  . potassium gluconate 595 (99 K) MG TABS tablet Take 595 mg by mouth daily.  . predniSONE (DELTASONE) 5 MG tablet Take 1 tablet by mouth  daily.  . traMADol (ULTRAM) 50 MG tablet Take 1 tablet by mouth as needed.     Allergies:   Nsaids; Oxybutynin; Requip [ropinirole hcl]; and Ropinirole   Social History   Socioeconomic History  . Marital status: Divorced    Spouse name: None  . Number of children: None  . Years of education: None  . Highest education level: None  Social Needs  . Financial resource strain: None  . Food insecurity - worry: None  . Food insecurity - inability: None  . Transportation needs - medical: None  . Transportation needs - non-medical: None  Occupational History  . None  Tobacco Use  . Smoking status: Never Smoker  . Smokeless tobacco: Never Used  Substance and Sexual Activity  . Alcohol use: No  . Drug use: No  . Sexual activity: None  Other Topics Concern  . None  Social History Narrative  . None     Family History: The patient's family history is negative for CAD, Heart attack, and Heart disease.  ROS:   ROS Please see the history of present illness.     All other systems reviewed and are negative.  EKGs/Labs/Other Studies Reviewed:    The following studies were reviewed today:   EKG:  EKG is  ordered today.  The ekg ordered today demonstrates [place] normal  Recent Labs: 03/09/2017 CMP is normal CBC is normal except for platelet count of 117,000 No results found for requested labs within last 8760 hours.  Recent Lipid Panel 09/03/2016 calculated LDL 78 No results found for: CHOL, TRIG, HDL, CHOLHDL, VLDL, LDLCALC, LDLDIRECT  Physical Exam:    VS:  BP (!) 112/58   Pulse 79   Resp 16   Ht 5\' 8"  (1.727 m)   Wt 120 lb 6.4 oz (54.6 kg)   BMI 18.31 kg/m     Wt Readings from Last 3 Encounters:  06/03/17 120 lb 6.4 oz (54.6 kg)  08/25/15 115 lb (52.2 kg)     GEN: thin, poor muscle mass looks somewhat chronically ill d in no acute distress HEENT: Normal NECK: No JVD; No carotid bruits LYMPHATICS: No lymphadenopathy CARDIAC: S1 is normal S2 appears single 3 of 6 mid  to late peaking systolic ejection murmur best heard in the aortic area radiates to the right clavicle into both carotid arteries.  Carotid upstroke is preserved there is no thrill no aortic regurgitation.  RRR,  RESPIRATORY:  Clear to auscultation without rales, wheezing or rhonchi  ABDOMEN: Soft, non-tender, non-distended MUSCULOSKELETAL:  No edema; No deformity  SKIN: Warm and dry NEUROLOGIC:  Alert and oriented x 3 PSYCHIATRIC:  Normal affect     Signed, Shirlee More, MD  06/04/2017 8:18 AM    [place]

## 2017-06-03 ENCOUNTER — Encounter: Payer: Self-pay | Admitting: Cardiology

## 2017-06-03 ENCOUNTER — Ambulatory Visit (INDEPENDENT_AMBULATORY_CARE_PROVIDER_SITE_OTHER): Payer: Medicare Other | Admitting: Cardiology

## 2017-06-03 VITALS — BP 112/58 | HR 79 | Resp 16 | Ht 68.0 in | Wt 120.4 lb

## 2017-06-03 DIAGNOSIS — C8590 Non-Hodgkin lymphoma, unspecified, unspecified site: Secondary | ICD-10-CM

## 2017-06-03 DIAGNOSIS — I350 Nonrheumatic aortic (valve) stenosis: Secondary | ICD-10-CM | POA: Insufficient documentation

## 2017-06-03 DIAGNOSIS — M3500 Sicca syndrome, unspecified: Secondary | ICD-10-CM | POA: Diagnosis not present

## 2017-06-03 DIAGNOSIS — E7801 Familial hypercholesterolemia: Secondary | ICD-10-CM

## 2017-06-03 DIAGNOSIS — E78019 Familial hypercholesterolemia, unspecified: Secondary | ICD-10-CM

## 2017-06-03 DIAGNOSIS — I10 Essential (primary) hypertension: Secondary | ICD-10-CM

## 2017-06-03 NOTE — Patient Instructions (Addendum)
Medication Instructions:  Your physician recommends that you continue on your current medications as directed. Please refer to the Current Medication list given to you today.  Labwork: None  Testing/Procedures: You had an EKG today.  Your physician has requested that you have an echocardiogram. Echocardiography is a painless test that uses sound waves to create images of your heart. It provides your doctor with information about the size and shape of your heart and how well your heart's chambers and valves are working. This procedure takes approximately one hour. There are no restrictions for this procedure.  Follow-Up: Your physician wants you to follow-up in: 6 months. You will receive a reminder letter in the mail two months in advance. If you don't receive a letter, please call our office to schedule the follow-up appointment.  Any Other Special Instructions Will Be Listed Below (If Applicable).     If you need a refill on your cardiac medications before your next appointment, please call your pharmacy.    Heart Murmur A heart murmur is an extra sound that is caused by chaotic blood flow. The murmur can be heard as a "hum" or "whoosh" sound when blood flows through the heart. The heart has four areas called chambers. Valves separate the upper and lower chambers from each other (tricuspid valve and mitral valve) and separate the lower chambers of the heart from pathways that lead away from the heart (aortic valve and pulmonary valve). Normally, the valves open to let blood flow through or out of your heart, and then they shut to keep the blood from flowing backward. There are two types of heart murmurs:  Innocent murmurs. Most people with this type of heart murmur do not have a heart problem. Many children have innocent heart murmurs. Your health care provider may suggest some basic testing to find out whether your murmur is an innocent murmur. If an innocent heart murmur is found,  there is no need for further tests or treatment and no need to restrict activities or stop playing sports.  Abnormal murmurs. These types of murmurs can occur in children and adults. Abnormal murmurs may be a sign of a more serious heart condition, such as a heart defect present at birth (congenital defect) or heart valve disease.  What are the causes? This condition is caused by heart valves that are not working properly. In children, abnormal heart murmurs are typically caused by congenital defects. In adults, abnormal murmurs are usually from heart valve problems caused by disease, infection, or aging. Three types of heart valve defects can cause a murmur:  Regurgitation. This is when blood leaks back through the valve in the wrong direction.  Mitral valve prolapse. This is when the mitral valve of the heart has a loose flap and does not close tightly.  Stenosis. This is when a valve does not open enough and blocks blood flow.  This condition may also be caused by:  Pregnancy.  Fever.  Overactive thyroid gland.  Anemia.  Exercise.  Rapid growth spurts (in children).  What are the signs or symptoms? Innocent murmurs do not cause symptoms, and many people with abnormal murmurs may or may not have symptoms. If symptoms do develop, they may include:  Shortness of breath.  Blue coloring of the skin, especially on the fingertips.  Chest pain.  Palpitations, or feeling a fluttering or skipped heartbeat.  Fainting.  Persistent cough.  Getting tired much faster than expected.  Swelling in the abdomen, feet, or ankles.  How  is this diagnosed? This condition may be diagnosed during a routine physical or other exam. If your health care provider hears a murmur with a stethoscope, he or she will listen for:  Where the murmur is located in your heart.  How long the murmur lasts (duration).  When the murmur is heard during the heartbeat.  How loud the murmur is. This may  help the health care provider figure out what is causing the murmur.  You may be referred to a heart specialist (cardiologist). You may also have other tests, including:  Electrocardiogram (ECG or EKG). This test measures the electrical activity of your heart.  Echocardiogram. This test uses high frequency sound waves to make pictures of your heart.  MRI or chest X-ray.  Cardiac catheterization. This test looks at blood flow through the heart.  For children and adults who have an abnormal heart murmur and want to stay active, it is important to complete testing, review test results, and receive recommendations from your health care provider. If heart disease is present, it may not be safe to play or be active. How is this treated? Heart murmurs themselves do not need treatment. In some cases, a heart murmur may go away on its own. If an underlying problem or disease is causing the murmur, you may need treatment. If treatment is needed, it will depend on the type and severity of the disease or heart problem causing the murmur. Treatment may include:  Medicine.  Surgery.  Dietary and lifestyle changes.  Follow these instructions at home:  Talk with your health care provider before participating in sports or other activities that require a lot of effort and energy (are strenuous).  Learn as much as possible about your condition and any related diseases. Ask your health care provider if you may at risk for any medical emergencies.  Talk with your health care provider about what symptoms you should look out for.  It is up to you to get your test results. Ask your health care provider, or the department that is doing the test, when your results will be ready.  Keep all follow-up visits as told by your health care provider. This is important. Contact a health care provider if:  You feel light-headed.  You are frequently short of breath.  You feel more tired than usual.  You are  having a hard time keeping up with normal activities or fitness routines.  You have swelling in your ankles or feet.  You have chest pain.  You notice that your heart often beats irregularly.  You develop any new symptoms. Get help right away if:  You develop severe chest pain.  You are having trouble breathing.  You have fainting spells.  Your symptoms suddenly get worse. These symptoms may represent a serious problem that is an emergency. Do not wait to see if the symptoms will go away. Get medical help right away. Call your local emergency services (911 in the [place] Do not drive yourself to the hospital. Summary  Normally, the heart valves open to let blood flow through or out of your heart, and then they shut to keep the blood from flowing backward.  Heart murmur is caused by heart valves that are not working properly.  You may need treatment if an underlying problem or disease is causing the heart murmur. Treatment may include medicine, surgery, or dietary and lifestyle changes.  Talk with your health care provider before participating in sports or other activities that require a  lot of effort and energy (are strenuous).  Talk with your health care provider about what symptoms you should watch out for. This information is not intended to replace advice given to you by your health care provider. Make sure you discuss any questions you have with your health care provider. Document Released: 05/22/2004 Document Revised: 04/02/2016 Document Reviewed: 04/02/2016 Elsevier Interactive Patient Education  [employer].

## 2017-06-04 ENCOUNTER — Encounter: Payer: Self-pay | Admitting: Cardiology

## 2017-06-11 ENCOUNTER — Telehealth: Payer: Self-pay

## 2017-06-11 NOTE — Telephone Encounter (Signed)
Patient advised echocardiogram appointment at [facility] scheduled for 06/15/17 at 1 pm, patient to arrive at 12:30 pm. Patient verbalized understanding. No further questions.

## 2017-06-17 DIAGNOSIS — I350 Nonrheumatic aortic (valve) stenosis: Secondary | ICD-10-CM | POA: Diagnosis not present

## 2017-06-22 ENCOUNTER — Telehealth: Payer: Self-pay

## 2017-06-22 ENCOUNTER — Other Ambulatory Visit: Payer: Self-pay

## 2017-06-22 DIAGNOSIS — I350 Nonrheumatic aortic (valve) stenosis: Secondary | ICD-10-CM

## 2017-06-22 NOTE — Telephone Encounter (Signed)
Left message to return call for echocardiogram results performed at [facility]. Per Dr. Bettina Gavia, patient has moderate atriac valve stenosis. Moving recall from August, 2019 to May, 2019 per Dr. Bettina Gavia. Will advise patient when she returns call.

## 2017-06-22 NOTE — Telephone Encounter (Signed)
Patient informed of results. Patient advised recall date moved up to May, 2019. Patient verbalized understanding.

## 2017-08-04 ENCOUNTER — Encounter: Payer: Self-pay | Admitting: Neurology

## 2017-08-07 ENCOUNTER — Ambulatory Visit (INDEPENDENT_AMBULATORY_CARE_PROVIDER_SITE_OTHER): Payer: Medicare Other | Admitting: Neurology

## 2017-08-07 ENCOUNTER — Encounter: Payer: Self-pay | Admitting: Neurology

## 2017-08-07 VITALS — BP 110/68 | HR 80 | Ht 68.0 in | Wt 119.1 lb

## 2017-08-07 DIAGNOSIS — G8929 Other chronic pain: Secondary | ICD-10-CM

## 2017-08-07 DIAGNOSIS — M545 Low back pain, unspecified: Secondary | ICD-10-CM

## 2017-08-07 DIAGNOSIS — G63 Polyneuropathy in diseases classified elsewhere: Secondary | ICD-10-CM | POA: Diagnosis not present

## 2017-08-07 DIAGNOSIS — M3509 Sicca syndrome with other organ involvement: Secondary | ICD-10-CM

## 2017-08-07 HISTORY — DX: Other chronic pain: G89.29

## 2017-08-07 MED ORDER — TIZANIDINE HCL 2 MG PO TABS: 2.0000 mg | ORAL_TABLET | Freq: Every evening | ORAL | 3 refills | Status: DC | PRN

## 2017-08-07 NOTE — Progress Notes (Signed)
[address] [facility] Clinic Note - Initial Visit   Date: 08/07/17  Carmen Blackwell MRN: [id number] DOB: 1931-01-17   Dear Hervey Ard, FNP:  Thank you for your kind referral of Carmen Blackwell for consultation of low back pain. Although her history is well known to you, please allow [place] to reiterate it for the purpose of our medical record. The patient was accompanied to the clinic by self.   History of Present Illness: Carmen Blackwell is a 84 y.o. right-handed Caucasian female with Sjogren's syndrome, GERD, hyperlipidemia, hypothyroidism MALT lymphoma (2004), thrombocytopenia, aortic stenosis presenting for evaluation of low back pain.    She has had low back pain dating back into early 1970s and had hemilaminectomy at L5-S1 in 1971.  She was doing well until ~ 2010 and developed low back pain again.  Pain is worse at night, described as throbbing with "firey pain" which radiates into her right hip, but does not radiate into her thigh or leg. Repositioning can alleviate her pain.  Pain is not present when upright, standing, or walking. She saw Dr. Gladstone Lighter for low back pain and last CT myelogram in 2017 showed multilevel disc bulge and disc space narrowing at L4-5, with possible L4 nerve encroachment on the right.  She was referred for physical therapy 2 year ago, and did not appreciate any benefit.  She has not had any epidural steroid injections. She did not tolerate gabapentin 100mg .  She complains of "shakiness" of the legs and walks with a cane since 2018.  She has not had any falls.  She complains of intermittent spells of numbness and tingling of the legs.    She also saw Dr. Sabra Heck at [facility] in March 2018 for neck pain.  She was given gabapentin, but developed side effects.   Out-side paper records, electronic medical record, and images have been reviewed where available and summarized as:  CT lumbar spine 08/24/2015: Mild curvature convex  to the left. No likely significant finding at L1-2, L2-3, L3-4 or L5-S1. Previous right hemilaminectomy at L5-S1. At L4-5, there is disc space narrowing. There is extrusion of disc material and some nitrogen gas into the right foraminal to extra foraminal region that could focally affect the right L4 nerve root. This was present on the previous MRI but is slightly more prominent presently.  Chronic left renal atrophy with hydroureteronephrosis.  Infrarenal abdominal aortic ectasia maximal transverse diameter 2.8 cm. Ectatic abdominal aorta at risk for aneurysm development. Recommend followup by ultrasound in 5 years. This recommendation follows ACR consensus guidelines: White Paper of the ACR Incidental Findings Committee II on Vascular Findings. J Am Coll Radiol 2013; 10:789-794.   Past Medical History:  Diagnosis Date  . Acquired hypothyroidism 04/09/2015  . Chronic kidney disease, stage III (moderate) ([place]) 04/09/2015  . Cough 08/28/2015  . Familial hypercholesterolemia 04/09/2015  . GERD (gastroesophageal reflux disease) 04/09/2015  . Heart murmur   . Hyperlipidemia 08/28/2015  . Hypertension 08/28/2015  . MALT lymphoma ([place]) 10/10/2015  . Osteopenia 06/02/2017  . Pain in joints 08/28/2015  . Pancytopenia ([place]) 10/10/2015  . Risk for falls 07/14/2016  . Sciatica 06/02/2017  . Sjogren's syndrome ([place])   . Thrombocytopenia ([place]) 03/09/2017  . Thyroid disease   . Urge incontinence 05/06/2016    Past Surgical History:  Procedure Laterality Date  . ABDOMINAL HYSTERECTOMY    . APPENDECTOMY    . LUMBAR DISC SURGERY    . TONSILLECTOMY       Medications:  Outpatient  Encounter Medications as of 08/07/2017  Medication Sig  . aspirin EC 81 MG tablet Take by mouth.  . Calcium Carb-Cholecalciferol (CALCIUM 600+D3 PO) Take 1 capsule by mouth daily.  . hydroxychloroquine (PLAQUENIL) 200 MG tablet Take 1 tablet by mouth daily.  Marland Kitchen levothyroxine (SYNTHROID, LEVOTHROID) 50 MCG tablet Take 1 tablet by  mouth daily.  Marland Kitchen lovastatin (MEVACOR) 20 MG tablet Take 1 tablet by mouth daily.  Marland Kitchen MAGNESIUM GLUCONATE PO Take 400 mg by mouth daily.   . Meclizine HCl 25 MG CHEW Chew 1 tablet by mouth as needed.  . meloxicam (MOBIC) 7.5 MG tablet Take 7.5 mg by mouth daily.  . mirabegron ER (MYRBETRIQ) 25 MG TB24 tablet Take 25 mg by mouth daily.  . potassium gluconate 595 (99 K) MG TABS tablet Take 595 mg by mouth daily.  . predniSONE (DELTASONE) 5 MG tablet Take 1 tablet by mouth daily.  . traMADol (ULTRAM) 50 MG tablet Take 1 tablet by mouth as needed.  Marland Kitchen tiZANidine (ZANAFLEX) 2 MG tablet Take 1 tablet (2 mg total) by mouth at bedtime as needed (low back pain).   No facility-administered encounter medications on file as of 08/07/2017.      Allergies:  Allergies  Allergen Reactions  . Nsaids   . Oxybutynin Other (See Comments)    dizziness dizziness   . Requip [Ropinirole Hcl]   . Ropinirole Other (See Comments)    Other reaction(s): Hypotension (ALLERGY/intolerance) Other reaction(s): Hypotension (ALLERGY/intolerance)     Family History: Family History  Problem Relation Age of Onset  . Other Mother        Died at 56  . Heart failure Brother        Died at 29  . CAD Neg Hx        Negative family history  . Heart attack Neg Hx   . Heart disease Neg Hx     Social History: Social History   Tobacco Use  . Smoking status: Never Smoker  . Smokeless tobacco: Never Used  Substance Use Topics  . Alcohol use: No  . Drug use: No   Social History   Social History Narrative   Lives alone in a 2 story home.  Has one child.  Retired.  Education: some college.     Review of Systems:  CONSTITUTIONAL: No fevers, chills, night sweats, or weight loss.   EYES: No visual changes or eye pain ENT: No hearing changes.  No history of nose bleeds.   RESPIRATORY: No cough, wheezing and shortness of breath.   CARDIOVASCULAR: Negative for chest pain, and palpitations.   GI: Negative for  abdominal discomfort, blood in stools or black stools.  No recent change in bowel habits.   GU:  No history of incontinence.   MUSCLOSKELETAL: +history of joint pain or swelling.  +myalgias.   SKIN: Negative for lesions, rash, and itching.   HEMATOLOGY/ONCOLOGY: Negative for prolonged bleeding, bruising easily, and swollen nodes.  No history of cancer.   ENDOCRINE: Negative for cold or heat intolerance, polydipsia or goiter.   PSYCH:  No depression or anxiety symptoms.   NEURO: As Above.   Vital Signs:  BP 110/68   Pulse 80   Ht 5\' 8"  (1.727 m)   Wt 119 lb 2 oz (54 kg)   SpO2 98%   BMI 18.11 kg/m   General Medical Exam:   General:  Frail appearing, comfortable.   Eyes/ENT: see cranial nerve examination.   Neck: No masses appreciated.  Full range  of motion without tenderness.  No carotid bruits. Respiratory:  Clear to auscultation, good air entry bilaterally.   Cardiac:  Regular rate and rhythm, systolic murmur.   Extremities:  No deformities, edema, or skin discoloration.  Skin:  No rashes or lesions.  Neurological Exam: MENTAL STATUS including orientation to time, place, person, recent and remote memory, attention span and concentration, language, and fund of knowledge is normal.  Speech is not dysarthric.  CRANIAL NERVES: II:  No visual field defects.  Unremarkable fundi.   III-IV-VI: Pupils equal round and reactive to light.  Normal conjugate, extra-ocular eye movements in all directions of gaze.  No nystagmus.  No ptosis.   V:  Normal facial sensation.   VII:  Normal facial symmetry and movements.  VIII:  Normal hearing and vestibular function.   IX-X:  Normal palatal movement.   XI:  Normal shoulder shrug and head rotation.   XII:  Normal tongue strength and range of motion, no deviation or fasciculation.  MOTOR:  No atrophy, fasciculations or abnormal movements.  No pronator drift.  Tone is normal.    Right Upper Extremity:    Left Upper Extremity:    Deltoid  5/5    Deltoid  5/5   Biceps  5/5   Biceps  5/5   Triceps  5/5   Triceps  5/5   Wrist extensors  5/5   Wrist extensors  5/5   Wrist flexors  5/5   Wrist flexors  5/5   Finger extensors  5/5   Finger extensors  5/5   Finger flexors  5/5   Finger flexors  5/5   Dorsal interossei  5/5   Dorsal interossei  5/5   Abductor pollicis  5/5   Abductor pollicis  5/5   Tone (Ashworth scale)  0  Tone (Ashworth scale)  0   Right Lower Extremity:    Left Lower Extremity:    Hip flexors  5/5   Hip flexors  5/5   Hip extensors  5/5   Hip extensors  5/5   Knee flexors  5/5   Knee flexors  5/5   Knee extensors  5/5   Knee extensors  5/5   Dorsiflexors  5/5   Dorsiflexors  5/5   Plantarflexors  5/5   Plantarflexors  5/5   Toe extensors  5/5   Toe extensors  5/5   Toe flexors  5/5   Toe flexors  5/5   Tone (Ashworth scale)  0  Tone (Ashworth scale)  0   MSRs:  Right                                                                 Left brachioradialis 2+  brachioradialis 2+  biceps 2+  biceps 2+  triceps 2+  triceps 2+  patellar 3+  patellar 3+  ankle jerk 0  ankle jerk 0  Hoffman no  Hoffman no  plantar response down  plantar response down   SENSORY:  Vibration is absent distal to ankles bilaterally.  Temperature and pin prick intact.  Rhomberg sign is present.  COORDINATION/GAIT: Normal finger-to- nose-finger.  Intact rapid alternating movements bilaterally. Gait is wide-based ataxic, unsteady even with a cane.   IMPRESSION: Chronic low back pain from degenerative arthritis  and possible lumbar radiculopathy.  CT lumbar spine was viewed with patient which shows mild multilevel disc bulge, L4-5 disc space narrowing, and mild right L4 nerve encroachment.  She describes lumbar radicular pain only when supine, which does not involve the L4 dermatome. I agree with Dr. Gladstone Lighter that there is nothing surgically to decompress.  She had many questions about her symptoms, imaging, and medications which I answered to  the best of my ability.  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  Greater than 50% of this time was spent in counseling, explanation of diagnosis, planning of further management, and coordination of care.   Thank you for allowing me to participate in patient's care.  If I can answer any additional questions, I would be pleased to do so.    Sincerely,    Elzia Hott K. Posey Pronto, DO

## 2017-08-07 NOTE — Patient Instructions (Addendum)
Start physical therapy at [facility] for low back pain  Start tizanidine 2mg  at bedtime for low back pain  Recommend using a rollator for balance  Return to clinic in 6 months

## 2017-08-11 ENCOUNTER — Telehealth: Payer: Self-pay | Admitting: Neurology

## 2017-08-11 NOTE — Telephone Encounter (Signed)
Pt left a VM message saying [facility] has not received the referral yet

## 2017-08-11 NOTE — Telephone Encounter (Signed)
Referral was just sent in this morning.

## 2017-11-05 ENCOUNTER — Encounter: Payer: Self-pay | Admitting: Cardiology

## 2017-11-05 ENCOUNTER — Ambulatory Visit (INDEPENDENT_AMBULATORY_CARE_PROVIDER_SITE_OTHER): Payer: Medicare Other | Admitting: Cardiology

## 2017-11-05 VITALS — BP 110/62 | HR 70 | Ht 68.0 in | Wt 117.0 lb

## 2017-11-05 DIAGNOSIS — I10 Essential (primary) hypertension: Secondary | ICD-10-CM

## 2017-11-05 DIAGNOSIS — H539 Unspecified visual disturbance: Secondary | ICD-10-CM | POA: Diagnosis not present

## 2017-11-05 DIAGNOSIS — I350 Nonrheumatic aortic (valve) stenosis: Secondary | ICD-10-CM

## 2017-11-05 DIAGNOSIS — E7800 Pure hypercholesterolemia, unspecified: Secondary | ICD-10-CM | POA: Diagnosis not present

## 2017-11-05 MED ORDER — LOVASTATIN 20 MG PO TABS: 20.0000 mg | ORAL_TABLET | Freq: Every day | ORAL | 6 refills | Status: DC

## 2017-11-05 NOTE — Progress Notes (Signed)
Cardiology Office Note:    Date:  11/05/2017   ID:  Carmen Blackwell, DOB [birth year], MRN [id number]  PCP:  Patient, No Pcp Per  Cardiologist:  Shirlee More, MD    Referring MD: Garwin Brothers, MD    ASSESSMENT:    1. Aortic stenosis, moderate   2. Essential hypertension   3. Pure hypercholesterolemia   4. Vision disturbance    PLAN:    In order of problems listed above:  1. By symptoms and exam stable at risk for progression recheck echocardiogram and a severe stenosis would require further evaluation or consider ablation of valve intervention surgical or TAVR 2. Stable blood pressure target presently not on hypertensive agents 3. Poorly controlled she had run out of her statin she will resume check baseline liver function lipid profile 4. Symptoms are concerning for amaurosis fugax check carotid duplex I encouraged her to discuss the symptoms next time she is seen by ophthalmology   Next appointment: 6 months   Medication Adjustments/Labs and Tests Ordered: Current medicines are reviewed at length with the patient today.  Concerns regarding medicines are outlined above.  Orders Placed This Encounter  Procedures  . Comprehensive Metabolic Panel (CMET)  . Lipid Profile  . ECHOCARDIOGRAM COMPLETE   Meds ordered this encounter  Medications  . lovastatin (MEVACOR) 20 MG tablet    Sig: Take 1 tablet (20 mg total) by mouth daily.    Dispense:  30 tablet    Refill:  6    Chief Complaint  Patient presents with  . Aortic Stenosis    History of Present Illness:    Carmen Blackwell is a 84 y.o. female with a hx of moderate, P/M gradients 43/25 mm Hg, aortic stenosis, hypertension, hyperlipidemia. Sjogrens syndrome and MALT lymphoma  in remission since 2006 last seen 06/03/17. Compliance with diet, lifestyle and medications: Yes  She was seen in the office in follow-up for aortic stenosis no fever chills shortness of breath chest pain or syncope.  She is followed regularly by  ophthalmology has not discussed with them but tells me she intermittently has visual loss and inquires whether she needs a carotid duplex performed.  Other complaints include chronic back pain muscle pain difficulty with balance and walking and easy bruising Past Medical History:  Diagnosis Date  . Acquired hypothyroidism 04/09/2015  . Chronic kidney disease, stage III (moderate) ([place]) 04/09/2015  . Cough 08/28/2015  . Familial hypercholesterolemia 04/09/2015  . GERD (gastroesophageal reflux disease) 04/09/2015  . Heart murmur   . Hyperlipidemia 08/28/2015  . Hypertension 08/28/2015  . MALT lymphoma ([place]) 10/10/2015  . Osteopenia 06/02/2017  . Pain in joints 08/28/2015  . Pancytopenia ([place]) 10/10/2015  . Risk for falls 07/14/2016  . Sciatica 06/02/2017  . Sjogren's syndrome ([place])   . Thrombocytopenia ([place]) 03/09/2017  . Thyroid disease   . Urge incontinence 05/06/2016    Past Surgical History:  Procedure Laterality Date  . ABDOMINAL HYSTERECTOMY    . APPENDECTOMY    . LUMBAR DISC SURGERY    . TONSILLECTOMY      Current Medications: Current Meds  Medication Sig  . Calcium Carb-Cholecalciferol (CALCIUM 600+D3 PO) Take 1 capsule by mouth daily.  . hydroxychloroquine (PLAQUENIL) 200 MG tablet Take 1 tablet by mouth daily.  Marland Kitchen levothyroxine (SYNTHROID, LEVOTHROID) 50 MCG tablet Take 1 tablet by mouth daily.  Marland Kitchen lovastatin (MEVACOR) 20 MG tablet Take 1 tablet (20 mg total) by mouth daily.  Marland Kitchen MAGNESIUM GLUCONATE PO Take 400 mg by mouth daily.   Marland Kitchen  Meclizine HCl 25 MG CHEW Chew 1 tablet by mouth as needed.  . meloxicam (MOBIC) 7.5 MG tablet Take 7.5 mg by mouth daily.  . mirabegron ER (MYRBETRIQ) 25 MG TB24 tablet Take 25 mg by mouth daily.  . potassium gluconate 595 (99 K) MG TABS tablet Take 595 mg by mouth as needed.   . predniSONE (DELTASONE) 5 MG tablet Take 1 tablet by mouth daily.  . traMADol (ULTRAM) 50 MG tablet Take 1 tablet by mouth as needed.  . [DISCONTINUED] lovastatin (MEVACOR) 20 MG  tablet Take 1 tablet by mouth daily.     Allergies:   Nsaids; Oxybutynin; Requip [ropinirole hcl]; and Ropinirole   Social History   Socioeconomic History  . Marital status: Divorced    Spouse name: Not on file  . Number of children: 1  . Years of education: 71  . Highest education level: Not on file  Occupational History  . Not on file  Social Needs  . Financial resource strain: Not on file  . Food insecurity:    Worry: Not on file    Inability: Not on file  . Transportation needs:    Medical: Not on file    Non-medical: Not on file  Tobacco Use  . Smoking status: Never Smoker  . Smokeless tobacco: Never Used  Substance and Sexual Activity  . Alcohol use: No  . Drug use: No  . Sexual activity: Not on file  Lifestyle  . Physical activity:    Days per week: Not on file    Minutes per session: Not on file  . Stress: Not on file  Relationships  . Social connections:    Talks on phone: Not on file    Gets together: Not on file    Attends religious service: Not on file    Active member of club or organization: Not on file    Attends meetings of clubs or organizations: Not on file    Relationship status: Not on file  Other Topics Concern  . Not on file  Social History Narrative   Lives alone in a 2 story home.  Has one child.  Retired.  Education: some college.      Family History: The patient's family history includes Heart failure in her brother; Other in her mother. There is no history of CAD, Heart attack, or Heart disease. ROS:   Please see the history of present illness.    All other systems reviewed and are negative.  EKGs/Labs/Other Studies Reviewed:    The following studies were reviewed today:   Recent Labs:   09/07/17 CMP normal except GFR 40 cc/min, CBC nromal except platelets 128000 No results found for requested labs within last 8760 hours.  Recent Lipid Panel No results found for: CHOL, TRIG, HDL, CHOLHDL, VLDL, LDLCALC, LDLDIRECT  Physical  Exam:    VS:  BP 110/62 (BP Location: Right Arm, Patient Position: Sitting, Cuff Size: Normal)   Pulse 70   Ht 5\' 8"  (1.727 m)   Wt 117 lb (53.1 kg)   SpO2 97%   BMI 17.79 kg/m     Wt Readings from Last 3 Encounters:  11/05/17 117 lb (53.1 kg)  08/07/17 119 lb 2 oz (54 kg)  06/03/17 120 lb 6.4 oz (54.6 kg)     GEN:  Well nourished, well developed in no acute distress HEENT: Normal NECK: No JVD; No carotid bruits LYMPHATICS: No lymphadenopathy CARDIAC: 2 3 of 6 harsh systolic ejection murmur aortic area to the right  clavicle does not radiate to the carotids asked to see him split murmur peaks mid-to-late physical findings consistent with moderate aortic stenosis RRR, RESPIRATORY:  Clear to auscultation without rales, wheezing or rhonchi  ABDOMEN: Soft, non-tender, non-distended MUSCULOSKELETAL:  No edema; No deformity  SKIN: Warm and dry NEUROLOGIC:  Alert and oriented x 3 PSYCHIATRIC:  Normal affect    Signed, Shirlee More, MD  11/05/2017 5:43 PM     [facility]

## 2017-11-05 NOTE — Patient Instructions (Addendum)
Medication Instructions:  Your physician has recommended you make the following change in your medication:  START lovastatin 20 mg daily  Labwork: Your physician recommends that you have the following labs drawn: CMP and lipid panel  Testing/Procedures: Your physician has requested that you have a carotid duplex. This test is an ultrasound of the carotid arteries in your neck. It looks at blood flow through these arteries that supply the brain with blood. Allow one hour for this exam. There are no restrictions or special instructions.  Your physician has requested that you have an echocardiogram. Echocardiography is a painless test that uses sound waves to create images of your heart. It provides your doctor with information about the size and shape of your heart and how well your heart's chambers and valves are working. This procedure takes approximately one hour. There are no restrictions for this procedure.  Follow-Up: Your physician recommends that you schedule a follow-up appointment in: 6 months  Any Other Special Instructions Will Be Listed Below (If Applicable).     If you need a refill on your cardiac medications before your next appointment, please call your pharmacy.   Jones, RN, BSN  As discussed Dr Garlon Hatchet in [place] is a family MD

## 2017-11-06 ENCOUNTER — Telehealth: Payer: Self-pay

## 2017-11-06 LAB — COMPREHENSIVE METABOLIC PANEL
A/G RATIO: 2.2 (ref 1.2–2.2)
ALT: 14 IU/L (ref 0–32)
AST: 22 IU/L (ref 0–40)
Albumin: 4.2 g/dL (ref 3.5–4.7)
Alkaline Phosphatase: 52 IU/L (ref 39–117)
BUN/Creatinine Ratio: 14 (ref 12–28)
BUN: 16 mg/dL (ref 8–27)
Bilirubin Total: 0.4 mg/dL (ref 0.0–1.2)
CALCIUM: 9.4 mg/dL (ref 8.7–10.3)
CO2: 27 mmol/L (ref 20–29)
Chloride: 104 mmol/L (ref 96–106)
Creatinine, Ser: 1.17 mg/dL — ABNORMAL HIGH (ref 0.57–1.00)
GFR, EST AFRICAN AMERICAN: 49 mL/min/{1.73_m2} — AB (ref >59)
GFR, EST NON AFRICAN AMERICAN: 43 mL/min/{1.73_m2} — AB (ref >59)
GLOBULIN, TOTAL: 1.9 g/dL (ref 1.5–4.5)
Glucose: 79 mg/dL (ref 65–99)
POTASSIUM: 5.2 mmol/L (ref 3.5–5.2)
Sodium: 144 mmol/L (ref 134–144)
Total Protein: 6.1 g/dL (ref 6.0–8.5)

## 2017-11-06 LAB — LIPID PANEL
CHOL/HDL RATIO: 1.8 ratio (ref 0.0–4.4)
Cholesterol, Total: 161 mg/dL (ref 100–199)
HDL: 89 mg/dL (ref >39)
LDL CALC: 61 mg/dL (ref 0–99)
TRIGLYCERIDES: 54 mg/dL (ref 0–149)
VLDL Cholesterol Cal: 11 mg/dL (ref 5–40)

## 2017-11-06 NOTE — Telephone Encounter (Signed)
Left message on patients voicemail to return call for lab results.  

## 2017-11-06 NOTE — Telephone Encounter (Signed)
Patient notified of normal lab results per Dr Bettina Gavia.  Patient verbalized understanding.

## 2017-12-03 ENCOUNTER — Other Ambulatory Visit: Payer: Medicare Other

## 2017-12-08 ENCOUNTER — Other Ambulatory Visit: Payer: Self-pay

## 2017-12-08 ENCOUNTER — Ambulatory Visit (INDEPENDENT_AMBULATORY_CARE_PROVIDER_SITE_OTHER): Payer: Medicare Other

## 2017-12-08 DIAGNOSIS — H539 Unspecified visual disturbance: Secondary | ICD-10-CM

## 2017-12-08 DIAGNOSIS — I350 Nonrheumatic aortic (valve) stenosis: Secondary | ICD-10-CM

## 2017-12-08 NOTE — Progress Notes (Signed)
Complete echocardiogram has been performed.  Jimmy Allison Deshotels RDCS 

## 2017-12-08 NOTE — Progress Notes (Signed)
Carotid duplex has been performed.  [place]

## 2017-12-16 DIAGNOSIS — R070 Pain in throat: Secondary | ICD-10-CM

## 2017-12-16 HISTORY — DX: Pain in throat: R07.0

## 2017-12-30 DIAGNOSIS — N952 Postmenopausal atrophic vaginitis: Secondary | ICD-10-CM

## 2017-12-30 HISTORY — DX: Postmenopausal atrophic vaginitis: N95.2

## 2018-02-12 ENCOUNTER — Ambulatory Visit: Payer: Medicare Other | Admitting: Neurology

## 2018-05-07 DIAGNOSIS — M159 Polyosteoarthritis, unspecified: Secondary | ICD-10-CM

## 2018-05-07 DIAGNOSIS — M150 Primary generalized (osteo)arthritis: Secondary | ICD-10-CM

## 2018-05-07 DIAGNOSIS — M8949 Other hypertrophic osteoarthropathy, multiple sites: Secondary | ICD-10-CM

## 2018-05-07 HISTORY — DX: Other hypertrophic osteoarthropathy, multiple sites: M89.49

## 2018-05-07 HISTORY — DX: Polyosteoarthritis, unspecified: M15.9

## 2018-05-07 HISTORY — DX: Primary generalized (osteo)arthritis: M15.0

## 2018-06-18 DIAGNOSIS — M5417 Radiculopathy, lumbosacral region: Secondary | ICD-10-CM

## 2018-06-18 HISTORY — DX: Radiculopathy, lumbosacral region: M54.17

## 2018-06-22 ENCOUNTER — Other Ambulatory Visit: Payer: Self-pay | Admitting: Emergency Medicine

## 2018-06-22 MED ORDER — LOVASTATIN 20 MG PO TABS: 20.0000 mg | ORAL_TABLET | Freq: Every day | ORAL | 0 refills | Status: DC

## 2018-06-25 ENCOUNTER — Other Ambulatory Visit: Payer: Self-pay | Admitting: Cardiology

## 2018-06-27 NOTE — Progress Notes (Signed)
Cardiology Office Note:    Date:  06/28/2018   ID:  Carmen Blackwell, DOB 1930-08-21, MRN [id number]  PCP:  Algis Greenhouse, MD  Cardiologist:  Shirlee More, MD    Referring MD: Algis Greenhouse, MD    ASSESSMENT:    1. Aortic stenosis, moderate   2. Essential hypertension   3. Chronic kidney disease, stage III (moderate) (H[facility])    PLAN:    In order of problems listed above:  1. Her last echocardiogram is best described as moderate to severe she is becoming increasingly frail debilitated struggling with day-to-day activities but no chest pain shortness of breath or syncope.  I told her honestly I do not know what part of her debilitation is aortic stenosis I asked her to consider whether she is except intervention TAVR we will repeat echocardiogram.  I suspect she has met criteria for intervention. 2. Stable continue current treatment blood pressure today will be more than 797 systolic and does not require antihypertensives 3. Hyperlipidemia stable continue her statin check lipid profile CMP chronic kidney disease recheck renal function today   Next appointment: 61-month  Medication Adjustments/Labs and Tests Ordered: Current medicines are reviewed at length with the patient today.  Concerns regarding medicines are outlined above.  No orders of the defined types were placed in this encounter.  No orders of the defined types were placed in this encounter.   Chief Complaint  Patient presents with  . Follow-up  . Aortic Stenosis    History of Present Illness:    Carmen Blackwell a 85y.o. female with a hx of moderate, P/M gradients 43/25 mm Hg, aortic stenosis, hypertension, hyperlipidemia. Sjogrens syndrome and MALT lymphoma  in remission since 2006  11/05/2017 last seen.  Echocardiogram 12/08/2017 showed ejection fraction normal 55 to 60% aortic stenosis was moderate to severe with peak and mean gradients of 58 and 36 mmHg calculated valve area of 0.61 and ratio of velocity  time interval less than 0.25.  Carotid duplex performed the same day showed no stenosis. Compliance with diet, lifestyle and medications:  Yes She has become increasingly frail debilitated struggles day-to-day no specific chest pain shortness of breath or syncope last echo echocardiogram and progressed and I asked her frankly what she would do if so I told her she had valve intervention she said she would do it.  We will recheck her echocardiogram I suspect she is reached a point for intervention and I suspect much of her recent debilitation and weakness is influenced by her valvular aortic stenosis.  Recheck labs including proBNP level prognostic with aortic stenosis along with renal function liver function and lipid profile and continue her statin.   Past Medical History:  Diagnosis Date  . Acquired hypothyroidism 04/09/2015  . Chronic kidney disease, stage III (moderate) [place] 04/09/2015  . Cough 08/28/2015  . Familial hypercholesterolemia 04/09/2015  . GERD (gastroesophageal reflux disease) 04/09/2015  . Heart murmur   . Hyperlipidemia 08/28/2015  . Hypertension 08/28/2015  . MALT lymphoma ([place] 10/10/2015  . Osteopenia 06/02/2017  . Pain in joints 08/28/2015  . [place] 10/10/2015  . Risk for falls 07/14/2016  . Sciatica 06/02/2017  . S[place]ren's syndrome ([place]   . Thrombocytopenia ([facility] 03/09/2017  . Thyroid disease   . Urge incontinence 05/06/2016    Past Surgical History:  Procedure Laterality Date  . ABDOMINAL HYSTERECTOMY    . APPENDECTOMY    . LUMBAR DISC SURGERY    . TONSILLECTOMY  Current Medications: Current Meds  Medication Sig  . Calcium Carb-Cholecalciferol (CALCIUM 600+D3 PO) Take 1 capsule by mouth daily.  Marland Kitchen estradiol (ESTRACE) 0.1 MG/GM vaginal cream PLACE 2 GRAMS VAGINALLY EVERY 3 DAYS  . hydroxychloroquine (PLAQUENIL) 200 MG tablet Take 1 tablet by mouth daily.  Marland Kitchen levothyroxine (SYNTHROID, LEVOTHROID) 50 MCG tablet Take 1 tablet by mouth daily.  Marland Kitchen lovastatin  (MEVACOR) 20 MG tablet Take 1 tablet (20 mg total) by mouth daily.  Marland Kitchen MAGNESIUM GLUCONATE PO Take 400 mg by mouth daily.   . Meclizine HCl 25 MG CHEW Chew 1 tablet by mouth as needed.  . potassium gluconate 595 (99 K) MG TABS tablet Take 595 mg by mouth daily.   . predniSONE (DELTASONE) 5 MG tablet Take 1 tablet by mouth daily.     Allergies:   Nsaids; Oxybutynin; Requip [ropinirole hcl]; and Ropinirole   Social History   Socioeconomic History  . Marital status: Divorced    Spouse name: Not on file  . Number of children: 1  . Years of education: 52  . Highest education level: Not on file  Occupational History  . Not on file  Social Needs  . Financial resource strain: Not on file  . Food insecurity:    Worry: Not on file    Inability: Not on file  . Transportation needs:    Medical: Not on file    Non-medical: Not on file  Tobacco Use  . Smoking status: Never Smoker  . Smokeless tobacco: Never Used  Substance and Sexual Activity  . Alcohol use: No  . Drug use: No  . Sexual activity: Not on file  Lifestyle  . Physical activity:    Days per week: Not on file    Minutes per session: Not on file  . Stress: Not on file  Relationships  . Social connections:    Talks on phone: Not on file    Gets together: Not on file    Attends religious service: Not on file    Active member of club or organization: Not on file    Attends meetings of clubs or organizations: Not on file    Relationship status: Not on file  Other Topics Concern  . Not on file  Social History Narrative   Lives alone in a 2 story home.  Has one child.  Retired.  Education: some college.      Family History: The patient's family history includes Heart failure in her brother; Other in her mother. There is no history of CAD, Heart attack, or Heart disease. ROS:   Please see the history of present illness.    All other systems reviewed and are negative.  EKGs/Labs/Other Studies Reviewed:    The following  studies were reviewed today:   Recent Labs: 11/05/2017: ALT 14; BUN 16; Creatinine, Ser 1.17; Potassium 5.2; Sodium 144  Recent Lipid Panel    Component Value Date/Time   CHOL 161 11/05/2017 1202   TRIG 54 11/05/2017 1202   HDL 89 11/05/2017 1202   CHOLHDL 1.8 11/05/2017 1202   LDLCALC 61 11/05/2017 1202    Physical Exam:    VS:  BP (!) 92/52 (BP Location: Left Arm, Patient Position: Sitting, Cuff Size: Small)   Pulse 74   Ht _0  (1.727 m)   Wt 107 lb 6.4 oz (48.7 kg)   SpO2 96%   BMI 16.33 kg/m     Wt Readings from Last 3 Encounters:  06/28/18 107 lb 6.4 oz (48.7 kg)  11/05/17 117 lb (53.1 kg)  08/07/17 119 lb 2 oz (54 kg)     GEN: Frail BMI 16.3%  in no acute distress HEENT: Normal NECK: No JVD; No carotid bruits LYMPHATICS: No lymphadenopathy CARDIAC: 3/6 AS musical late peak single S2 to the carotids RRR, no rubs, gallops RESPIRATORY:  Clear to auscultation without rales, wheezing or rhonchi  ABDOMEN: Soft, non-tender, non-distended MUSCULOSKELETAL:  No edema; No deformity  SKIN: Warm and dry NEUROLOGIC:  Alert and oriented x 3 PSYCHIATRIC:  Normal affect    Signed, Shirlee More, MD  06/28/2018 11:48 AM    [place]

## 2018-06-28 ENCOUNTER — Ambulatory Visit (INDEPENDENT_AMBULATORY_CARE_PROVIDER_SITE_OTHER): Payer: Medicare Other | Admitting: Cardiology

## 2018-06-28 ENCOUNTER — Encounter: Payer: Self-pay | Admitting: Cardiology

## 2018-06-28 VITALS — BP 92/52 | HR 74 | Ht 68.0 in | Wt 107.4 lb

## 2018-06-28 DIAGNOSIS — M3500 Sicca syndrome, unspecified: Secondary | ICD-10-CM | POA: Diagnosis not present

## 2018-06-28 DIAGNOSIS — I350 Nonrheumatic aortic (valve) stenosis: Secondary | ICD-10-CM

## 2018-06-28 DIAGNOSIS — I10 Essential (primary) hypertension: Secondary | ICD-10-CM

## 2018-06-28 DIAGNOSIS — N183 Chronic kidney disease, stage 3 unspecified: Secondary | ICD-10-CM

## 2018-06-28 DIAGNOSIS — R0602 Shortness of breath: Secondary | ICD-10-CM

## 2018-06-28 DIAGNOSIS — E7800 Pure hypercholesterolemia, unspecified: Secondary | ICD-10-CM

## 2018-06-28 MED ORDER — LOVASTATIN 20 MG PO TABS: 20.0000 mg | ORAL_TABLET | Freq: Every day | ORAL | 0 refills | Status: DC

## 2018-06-28 NOTE — Patient Instructions (Signed)
Medication Instructions:  Your physician recommends that you continue on your current medications as directed. Please refer to the Current Medication list given to you today.  If you need a refill on your cardiac medications before your next appointment, please call your pharmacy.   Lab work: Your physician recommends that you return for lab work today: CMP, lipid panel, ProBNP.   If you have labs (blood work) drawn today and your tests are completely normal, you will receive your results only by: Marland Kitchen MyChart Message (if you have MyChart) OR . A paper copy in the mail If you have any lab test that is abnormal or we need to change your treatment, we will call you to review the results.  Testing/Procedures: You had an EKG today.   Your physician has requested that you have an echocardiogram. Echocardiography is a painless test that uses sound waves to create images of your heart. It provides your doctor with information about the size and shape of your heart and how well your heart's chambers and valves are working. This procedure takes approximately one hour. There are no restrictions for this procedure.  Follow-Up: At [facility], you and your health needs are our priority.  As part of our continuing mission to provide you with exceptional heart care, we have created designated Provider Care Teams.  These Care Teams include your primary Cardiologist (physician) and Advanced Practice Providers (APPs -  Physician Assistants and Nurse Practitioners) who all work together to provide you with the care you need, when you need it. You will need a follow up appointment in 3 months.      Echocardiogram An echocardiogram is a procedure that uses painless sound waves (ultrasound) to produce an image of the heart. Images from an echocardiogram can provide important information about:  Signs of coronary artery disease (CAD).  Aneurysm detection. An aneurysm is a weak or damaged part of an artery  wall that bulges out from the normal force of blood pumping through the body.  Heart size and shape. Changes in the size or shape of the heart can be associated with certain conditions, including heart failure, aneurysm, and CAD.  Heart muscle function.  Heart valve function.  Signs of a past heart attack.  Fluid buildup around the heart.  Thickening of the heart muscle.  A tumor or infectious growth around the heart valves. Tell a health care provider about:  Any allergies you have.  All medicines you are taking, including vitamins, herbs, eye drops, creams, and over-the-counter medicines.  Any blood disorders you have.  Any surgeries you have had.  Any medical conditions you have.  Whether you are pregnant or may be pregnant. What are the risks? Generally, this is a safe procedure. However, problems may occur, including:  Allergic reaction to dye (contrast) that may be used during the procedure. What happens before the procedure? No specific preparation is needed. You may eat and drink normally. What happens during the procedure?   An IV tube may be inserted into one of your veins.  You may receive contrast through this tube. A contrast is an injection that improves the quality of the pictures from your heart.  A gel will be applied to your chest.  A wand-like tool (transducer) will be moved over your chest. The gel will help to transmit the sound waves from the transducer.  The sound waves will harmlessly bounce off of your heart to allow the heart images to be captured in real-time motion. The  images will be recorded on a computer. The procedure may vary among health care providers and hospitals. What happens after the procedure?  You may return to your normal, everyday life, including diet, activities, and medicines, unless your health care provider tells you not to do that. Summary  An echocardiogram is a procedure that uses painless sound waves (ultrasound)  to produce an image of the heart.  Images from an echocardiogram can provide important information about the size and shape of your heart, heart muscle function, heart valve function, and fluid buildup around your heart.  You do not need to do anything to prepare before this procedure. You may eat and drink normally.  After the echocardiogram is completed, you may return to your normal, everyday life, unless your health care provider tells you not to do that. This information is not intended to replace advice given to you by your health care provider. Make sure you discuss any questions you have with your health care provider. Document Released: 04/11/2000 Document Revised: 05/17/2016 Document Reviewed: 05/17/2016 Elsevier Interactive Patient Education  2019 [employer].

## 2018-06-29 LAB — LIPID PANEL
CHOL/HDL RATIO: 2.2 ratio (ref 0.0–4.4)
Cholesterol, Total: 178 mg/dL (ref 100–199)
HDL: 82 mg/dL (ref >39)
LDL Calculated: 77 mg/dL (ref 0–99)
Triglycerides: 95 mg/dL (ref 0–149)
VLDL CHOLESTEROL CAL: 19 mg/dL (ref 5–40)

## 2018-06-29 LAB — COMPREHENSIVE METABOLIC PANEL
ALBUMIN: 4.3 g/dL (ref 3.6–4.6)
ALT: 11 IU/L (ref 0–32)
AST: 17 IU/L (ref 0–40)
Albumin/Globulin Ratio: 2 (ref 1.2–2.2)
Alkaline Phosphatase: 56 IU/L (ref 39–117)
BUN/Creatinine Ratio: 13 (ref 12–28)
BUN: 15 mg/dL (ref 8–27)
Bilirubin Total: 0.4 mg/dL (ref 0.0–1.2)
CALCIUM: 9.5 mg/dL (ref 8.7–10.3)
CO2: 26 mmol/L (ref 20–29)
CREATININE: 1.12 mg/dL — AB (ref 0.57–1.00)
Chloride: 102 mmol/L (ref 96–106)
GFR calc non Af Amer: 45 mL/min/{1.73_m2} — ABNORMAL LOW (ref >59)
GFR, EST AFRICAN AMERICAN: 52 mL/min/{1.73_m2} — AB (ref >59)
GLUCOSE: 85 mg/dL (ref 65–99)
Globulin, Total: 2.1 g/dL (ref 1.5–4.5)
Potassium: 5.2 mmol/L (ref 3.5–5.2)
Sodium: 143 mmol/L (ref 134–144)
TOTAL PROTEIN: 6.4 g/dL (ref 6.0–8.5)

## 2018-06-29 LAB — PRO B NATRIURETIC PEPTIDE: NT-PRO BNP: 418 pg/mL (ref 0–738)

## 2018-07-12 ENCOUNTER — Other Ambulatory Visit: Payer: Self-pay

## 2018-07-12 MED ORDER — LOVASTATIN 20 MG PO TABS: 20.0000 mg | ORAL_TABLET | Freq: Every day | ORAL | 0 refills | Status: DC

## 2018-07-19 ENCOUNTER — Other Ambulatory Visit: Payer: Medicare Other

## 2018-07-28 ENCOUNTER — Telehealth: Payer: Self-pay | Admitting: *Deleted

## 2018-07-28 NOTE — Telephone Encounter (Signed)
Cancelled echo, pt knows

## 2018-08-19 ENCOUNTER — Other Ambulatory Visit: Payer: Medicare Other

## 2018-09-28 ENCOUNTER — Ambulatory Visit: Payer: Medicare Other | Admitting: Cardiology

## 2018-10-14 ENCOUNTER — Ambulatory Visit (INDEPENDENT_AMBULATORY_CARE_PROVIDER_SITE_OTHER): Payer: Medicare Other

## 2018-10-14 ENCOUNTER — Other Ambulatory Visit: Payer: Self-pay

## 2018-10-14 DIAGNOSIS — I10 Essential (primary) hypertension: Secondary | ICD-10-CM | POA: Diagnosis not present

## 2018-10-14 DIAGNOSIS — I350 Nonrheumatic aortic (valve) stenosis: Secondary | ICD-10-CM

## 2018-10-14 NOTE — Progress Notes (Signed)
2D echocardiogram completed.   10/14/2018 11:10 AM Maudry Mayhew, MHA, RVT, RDCS, RDMS

## 2018-10-16 ENCOUNTER — Other Ambulatory Visit: Payer: Self-pay | Admitting: Cardiology

## 2018-10-18 NOTE — Telephone Encounter (Signed)
Lovastatin refilled per request

## 2018-10-19 ENCOUNTER — Other Ambulatory Visit: Payer: Self-pay

## 2018-10-19 NOTE — Telephone Encounter (Signed)
60 day refill sent with note for patient to call office to reschedule 3 mo f/u.

## 2018-11-02 DIAGNOSIS — N763 Subacute and chronic vulvitis: Secondary | ICD-10-CM | POA: Insufficient documentation

## 2018-11-13 ENCOUNTER — Other Ambulatory Visit: Payer: Self-pay | Admitting: Cardiology

## 2018-12-08 ENCOUNTER — Other Ambulatory Visit: Payer: Self-pay | Admitting: Cardiology

## 2019-03-03 ENCOUNTER — Other Ambulatory Visit: Payer: Self-pay | Admitting: Cardiology

## 2019-03-03 NOTE — Telephone Encounter (Signed)
Lovastatin refill sent, requires f/u visit

## 2019-04-07 ENCOUNTER — Other Ambulatory Visit: Payer: Self-pay | Admitting: Cardiology

## 2019-04-12 DIAGNOSIS — H8110 Benign paroxysmal vertigo, unspecified ear: Secondary | ICD-10-CM

## 2019-04-12 HISTORY — DX: Benign paroxysmal vertigo, unspecified ear: H81.10

## 2019-07-04 ENCOUNTER — Telehealth: Payer: Self-pay

## 2019-07-04 ENCOUNTER — Other Ambulatory Visit: Payer: Self-pay | Admitting: *Deleted

## 2019-07-04 MED ORDER — LOVASTATIN 20 MG PO TABS: 20.0000 mg | ORAL_TABLET | Freq: Every day | ORAL | 0 refills | Status: DC

## 2019-07-04 NOTE — Telephone Encounter (Signed)
LOVASTATIN 20 MG TABLET. TAKE 1 TABLET BY MOUTH EVERY DAY. #30 LAST FILLED 04/07/2019.

## 2019-07-04 NOTE — Telephone Encounter (Signed)
REFILL SENT 

## 2019-07-20 DIAGNOSIS — A059 Bacterial foodborne intoxication, unspecified: Secondary | ICD-10-CM

## 2019-07-20 DIAGNOSIS — I7300 Raynaud's syndrome without gangrene: Secondary | ICD-10-CM | POA: Insufficient documentation

## 2019-07-20 HISTORY — DX: Raynaud's syndrome without gangrene: I73.00

## 2019-07-20 HISTORY — DX: Bacterial foodborne intoxication, unspecified: A05.9

## 2019-07-21 ENCOUNTER — Telehealth: Payer: Self-pay | Admitting: Cardiology

## 2019-07-21 NOTE — Telephone Encounter (Signed)
New Message:   Pt have an appt with Dr Bettina Gavia on 08-16-19. She said Dr Garlon Hatchet was supposed to have sent Dr Bettina Gavia a message yesterday. She says she needs to be seen soon, need appt before 08-16-19.

## 2019-07-21 NOTE — Telephone Encounter (Signed)
Patient states that she saw Dr. Garlon Hatchet yesterday and he would like for the patient to be seen by Dr. Bettina Gavia in regards to her heart valve.  According to office visit note from Dr. Garlon Hatchet the patient was noted to have aortic stenosis 06/2018 and was supposed to follow up 3 months later with Dr. Bettina Gavia but did not do so. The patient is scheduled for an appointment on 04/20 and she was looking for an earlier appointment. I advised her that there were no appointments available prior to that at this time. She would like to be called if anything opens up prior.

## 2019-08-11 ENCOUNTER — Other Ambulatory Visit: Payer: Self-pay | Admitting: Cardiology

## 2019-08-12 ENCOUNTER — Other Ambulatory Visit: Payer: Self-pay

## 2019-08-15 NOTE — Progress Notes (Signed)
Cardiology Office Note:    Date:  08/16/2019   ID:  Carmen Blackwell, DOB 09-22-1930, MRN [id number]  PCP:  Algis Greenhouse, MD  Cardiologist:  Shirlee More, MD    Referring MD: Algis Greenhouse, MD    ASSESSMENT:    1. Aortic stenosis, moderate   2. Essential hypertension   3. Stage 3 chronic kidney disease, unspecified whether stage 3a or 3b CKD   4. Sjogren's syndrome, with unspecified organ involvement ([place])   5. Pure hypercholesterolemia   6. Acquired hypothyroidism    PLAN:    In order of problems listed above:  1. I am unsure of how much of her general deterioration in cold extremities is due to valvular heart disease recheck echocardiogram I suspected severe and pain. 2. Stable hypertension BP at target amount not on antihypertensive agents 3. Recheck renal function today 4. Stable she takes hydroxychloroquine 5. Continue statin check lipid profile liver function 6. She has had no recent labs we will check a TSH T3-T4 7. For cold legs check ABIs and do not think that the predominant problem here is a large vessel may be a manifestation of underlying connective tissue disease he may benefit from vasodilator like low-dose amlodipine but I like to see the severity of her aortic stenosis before initiating it.   Next appointment: 4 weeks   Medication Adjustments/Labs and Tests Ordered: Current medicines are reviewed at length with the patient today.  Concerns regarding medicines are outlined above.  No orders of the defined types were placed in this encounter.  No orders of the defined types were placed in this encounter.   No chief complaint on file.   History of Present Illness:    Carmen Blackwell is a 86 y.o. female with a hx of moderate, P/M gradients 43/25 mm Hg, aortic stenosis, hypertension, hyperlipidemia. Sjogrens syndrome and MALT lymphoma  in remission since 2006  last seen 06/28/2018.  Echocardiogram performed 10/05/2018 showing mild aortic stenosis and  regurgitation.  I reviewed that echocardiogram the aortic valve is severely restricted secondary to calcified and is very poorly evaluated with Doppler obtained on a single view.  Most recent labs 12/09/2018 from her PCP: TSH 1.73 09/07/2017 CMP with a potassium 4.2 creatinine 1.24 GFR 40 cc normal liver function tests  Compliance with diet, lifestyle and medications: Yes  Her predominant reason to be here is that her feet are reddened cool and just persistently feel cold.  She is not having typical Raynaud's.  She was recommended to see me for vascular evaluation.  She is very frail she struggles with ambulation she is unsteady has trouble feeling the ground but is not having claudication.  On exam she has palpable posterior tibial and dorsalis pedal pulses in both feet but the legs are cool from the mid calf down and rubor is present.  She has some mild dependent edema uses support hose.  She is not having chest pain shortness of breath or syncope from her aortic stenosis.  Her weight is stable at 100 pounds but she increasingly struggles with day-to-day moment to moment activities and is becoming frail  Past Medical History:  Diagnosis Date  . Acquired hypothyroidism 04/09/2015  . Aortic stenosis, moderate 06/03/2017  . Atrophic vaginitis 12/30/2017   Formatting of this note might be different from the original. 2019: ERT vaginally  . Bacterial food poisoning 07/20/2019   Formatting of this note might be different from the original. 2021  . Benign positional vertigo 04/12/2019  Formatting of this note might be different from the original. 2916, 2020  . Chronic kidney disease, stage III (moderate) 04/09/2015  . Chronic right-sided low back pain without sciatica 08/07/2017  . Chronic vulvitis 11/02/2018   Formatting of this note might be different from the original. 2020  . Cough 08/28/2015  . Essential hypertension 08/28/2015  . Familial hypercholesterolemia 04/09/2015  . GERD (gastroesophageal  reflux disease) 04/09/2015  . Heart murmur   . Hyperlipidemia 08/28/2015  . Hypertension 08/28/2015  . Lumbosacral radiculopathy at L5 06/18/2018   Formatting of this note might be different from the original. 2020  . MALT lymphoma ([place]) 10/10/2015  . Mixed hyperlipidemia 08/28/2015  . Osteopenia 06/02/2017  . Pain in joints 08/28/2015  . Pancytopenia ([place]) 10/10/2015  . Primary osteoarthritis involving multiple joints 05/07/2018  . Raynaud's disease without gangrene 07/20/2019   Formatting of this note might be different from the original. 2020: onset  . Risk for falls 07/14/2016  . Sciatica 06/02/2017  . Sjogren's syndrome ([place])   . Throat burning 12/16/2017   Formatting of this note might be different from the original. 2019: chronic  . Thrombocytopenia ([place]) 03/09/2017  . Thyroid disease   . Urge incontinence 05/06/2016    Past Surgical History:  Procedure Laterality Date  . ABDOMINAL HYSTERECTOMY    . APPENDECTOMY    . LUMBAR DISC SURGERY    . TONSILLECTOMY      Current Medications: No outpatient medications have been marked as taking for the 08/16/19 encounter (Office Visit) with Richardo Priest, MD.     Allergies:   Nsaids, Oxybutynin, Requip [ropinirole hcl], and Ropinirole   Social History   Socioeconomic History  . Marital status: Divorced    Spouse name: Not on file  . Number of children: 1  . Years of education: 55  . Highest education level: Not on file  Occupational History  . Not on file  Tobacco Use  . Smoking status: Never Smoker  . Smokeless tobacco: Never Used  Substance and Sexual Activity  . Alcohol use: No  . Drug use: No  . Sexual activity: Not on file  Other Topics Concern  . Not on file  Social History Narrative   Lives alone in a 2 story home.  Has one child.  Retired.  Education: some college.    Social Determinants of Health   Financial Resource Strain:   . Difficulty of Paying Living Expenses:   Food Insecurity:   . Worried About Sales executive in the Last Year:   . Arboriculturist in the Last Year:   Transportation Needs:   . Film/video editor (Medical):   Marland Kitchen Lack of Transportation (Non-Medical):   Physical Activity:   . Days of Exercise per Week:   . Minutes of Exercise per Session:   Stress:   . Feeling of Stress :   Social Connections:   . Frequency of Communication with Friends and Family:   . Frequency of Social Gatherings with Friends and Family:   . Attends Religious Services:   . Active Member of Clubs or Organizations:   . Attends Archivist Meetings:   Marland Kitchen Marital Status:      Family History: The patient's family history includes Heart failure in her brother; Other in her mother. There is no history of CAD, Heart attack, or Heart disease. ROS:   Please see the history of present illness.    All other systems reviewed and are negative.  EKGs/Labs/Other Studies Reviewed:    The following studies were reviewed today:  EKG:  EKG ordered today and personally reviewed.  The ekg ordered today demonstrates sinus rhythm normal  Recent Labs: No results found for requested labs within last 8760 hours.  Recent Lipid Panel    Component Value Date/Time   CHOL 178 06/28/2018 1334   TRIG 95 06/28/2018 1334   HDL 82 06/28/2018 1334   CHOLHDL 2.2 06/28/2018 1334   LDLCALC 77 06/28/2018 1334    Physical Exam:    VS:  There were no vitals taken for this visit.    Wt Readings from Last 3 Encounters:  06/28/18 107 lb 6.4 oz (48.7 kg)  11/05/17 117 lb (53.1 kg)  08/07/17 119 lb 2 oz (54 kg)     GEN: Quite thin unsteady walking uses a quad cane appears chronically ill  in no acute distress HEENT: Normal NECK: No JVD; No carotid bruits LYMPHATICS: No lymphadenopathy CARDIAC: 3/6 murmur aortic stenosis harsh radiates to the carotids S2 single no AR RRR, no murmurs, rubs, gallops RESPIRATORY:  Clear to auscultation without rales, wheezing or rhonchi  ABDOMEN: Soft, non-tender,  non-distended MUSCULOSKELETAL:  No edema; No deformity have rubor cold from the mid calf down motor sensory intact has a palpable dorsalis pedis and posterior tibial bilaterally SKIN: Warm and dry NEUROLOGIC:  Alert and oriented x 3 PSYCHIATRIC:  Normal affect    Signed, Shirlee More, MD  08/16/2019 1:40 PM    [facility]

## 2019-08-16 ENCOUNTER — Ambulatory Visit: Payer: Medicare HMO | Admitting: Cardiology

## 2019-08-16 ENCOUNTER — Encounter: Payer: Self-pay | Admitting: Cardiology

## 2019-08-16 ENCOUNTER — Other Ambulatory Visit: Payer: Self-pay

## 2019-08-16 VITALS — BP 122/60 | HR 74 | Temp 98.5°F | Ht 68.0 in | Wt 109.0 lb

## 2019-08-16 DIAGNOSIS — R209 Unspecified disturbances of skin sensation: Secondary | ICD-10-CM

## 2019-08-16 DIAGNOSIS — E039 Hypothyroidism, unspecified: Secondary | ICD-10-CM

## 2019-08-16 DIAGNOSIS — E7800 Pure hypercholesterolemia, unspecified: Secondary | ICD-10-CM

## 2019-08-16 DIAGNOSIS — I350 Nonrheumatic aortic (valve) stenosis: Secondary | ICD-10-CM

## 2019-08-16 DIAGNOSIS — I10 Essential (primary) hypertension: Secondary | ICD-10-CM

## 2019-08-16 DIAGNOSIS — N183 Chronic kidney disease, stage 3 unspecified: Secondary | ICD-10-CM | POA: Diagnosis not present

## 2019-08-16 DIAGNOSIS — M3500 Sicca syndrome, unspecified: Secondary | ICD-10-CM | POA: Diagnosis not present

## 2019-08-16 MED ORDER — LOVASTATIN 20 MG PO TABS: 20.0000 mg | ORAL_TABLET | Freq: Every day | ORAL | 3 refills | Status: DC

## 2019-08-16 NOTE — Patient Instructions (Signed)
Medication Instructions:  Your physician recommends that you continue on your current medications as directed. Please refer to the Current Medication list given to you today.  *If you need a refill on your cardiac medications before your next appointment, please call your pharmacy*   Lab Work: Your physician recommends that you return for lab work in: TODAY TSH, T3, T4, CBC, CMP, LIPIDS, ProBNP If you have labs (blood work) drawn today and your tests are completely normal, you will receive your results only by: Marland Kitchen MyChart Message (if you have MyChart) OR . A paper copy in the mail If you have any lab test that is abnormal or we need to change your treatment, we will call you to review the results.   Testing/Procedures: Your physician has requested that you have an ankle brachial index (ABI). During this test an ultrasound and blood pressure cuff are used to evaluate the arteries that supply the arms and legs with blood. Allow thirty minutes for this exam. There are no restrictions or special instructions.  Your physician has requested that you have an echocardiogram. Echocardiography is a painless test that uses sound waves to create images of your heart. It provides your doctor with information about the size and shape of your heart and how well your heart's chambers and valves are working. This procedure takes approximately one hour. There are no restrictions for this procedure.     Follow-Up: At [facility], you and your health needs are our priority.  As part of our continuing mission to provide you with exceptional heart care, we have created designated Provider Care Teams.  These Care Teams include your primary Cardiologist (physician) and Advanced Practice Providers (APPs -  Physician Assistants and Nurse Practitioners) who all work together to provide you with the care you need, when you need it.  We recommend signing up for the patient portal called "MyChart".  Sign up  information is provided on this After Visit Summary.  MyChart is used to connect with patients for Virtual Visits (Telemedicine).  Patients are able to view lab/test results, encounter notes, upcoming appointments, etc.  Non-urgent messages can be sent to your provider as well.   To learn more about what you can do with MyChart, go to [email or web address].    Your next appointment:   4 week(s)  The format for your next appointment:   In Person  Provider:   Shirlee More, MD   Other Instructions

## 2019-08-17 ENCOUNTER — Telehealth: Payer: Self-pay

## 2019-08-17 LAB — COMPREHENSIVE METABOLIC PANEL
ALT: 14 IU/L (ref 0–32)
AST: 24 IU/L (ref 0–40)
Albumin/Globulin Ratio: 2.2 (ref 1.2–2.2)
Albumin: 4.3 g/dL (ref 3.6–4.6)
Alkaline Phosphatase: 56 IU/L (ref 39–117)
BUN/Creatinine Ratio: 18 (ref 12–28)
BUN: 18 mg/dL (ref 8–27)
Bilirubin Total: 0.4 mg/dL (ref 0.0–1.2)
CO2: 25 mmol/L (ref 20–29)
Calcium: 9.1 mg/dL (ref 8.7–10.3)
Chloride: 105 mmol/L (ref 96–106)
Creatinine, Ser: 1.02 mg/dL — ABNORMAL HIGH (ref 0.57–1.00)
GFR calc Af Amer: 57 mL/min/{1.73_m2} — ABNORMAL LOW (ref >59)
GFR calc non Af Amer: 50 mL/min/{1.73_m2} — ABNORMAL LOW (ref >59)
Globulin, Total: 2 g/dL (ref 1.5–4.5)
Glucose: 84 mg/dL (ref 65–99)
Potassium: 4.3 mmol/L (ref 3.5–5.2)
Sodium: 143 mmol/L (ref 134–144)
Total Protein: 6.3 g/dL (ref 6.0–8.5)

## 2019-08-17 LAB — CBC
Hematocrit: 36.9 % (ref 34.0–46.6)
Hemoglobin: 12.4 g/dL (ref 11.1–15.9)
MCH: 31.6 pg (ref 26.6–33.0)
MCHC: 33.6 g/dL (ref 31.5–35.7)
MCV: 94 fL (ref 79–97)
Platelets: 119 10*3/uL — ABNORMAL LOW (ref 150–450)
RBC: 3.93 x10E6/uL (ref 3.77–5.28)
RDW: 11.6 % — ABNORMAL LOW (ref 11.7–15.4)
WBC: 5.3 10*3/uL (ref 3.4–10.8)

## 2019-08-17 LAB — LIPID PANEL
Chol/HDL Ratio: 1.9 ratio (ref 0.0–4.4)
Cholesterol, Total: 170 mg/dL (ref 100–199)
HDL: 89 mg/dL (ref >39)
LDL Chol Calc (NIH): 68 mg/dL (ref 0–99)
Triglycerides: 71 mg/dL (ref 0–149)
VLDL Cholesterol Cal: 13 mg/dL (ref 5–40)

## 2019-08-17 LAB — TSH+T4F+T3FREE
Free T4: 1.17 ng/dL (ref 0.82–1.77)
T3, Free: 2.2 pg/mL (ref 2.0–4.4)
TSH: 2.34 u[IU]/mL (ref 0.450–4.500)

## 2019-08-17 LAB — PRO B NATRIURETIC PEPTIDE: NT-Pro BNP: 481 pg/mL (ref 0–738)

## 2019-08-17 NOTE — Telephone Encounter (Signed)
-----   Message from Richardo Priest, MD sent at 08/17/2019 12:53 PM EDT ----- Normal or stable result  No changes in treatment

## 2019-08-17 NOTE — Telephone Encounter (Signed)
Spoke with patient regarding results and recommendation.  Patient verbalizes understanding and is agreeable to plan of care. Advised patient to call back with any issues or concerns.  

## 2019-08-18 ENCOUNTER — Other Ambulatory Visit: Payer: Self-pay | Admitting: Cardiology

## 2019-09-08 ENCOUNTER — Other Ambulatory Visit: Payer: Self-pay

## 2019-09-08 ENCOUNTER — Ambulatory Visit (INDEPENDENT_AMBULATORY_CARE_PROVIDER_SITE_OTHER): Payer: Medicare HMO

## 2019-09-08 ENCOUNTER — Ambulatory Visit: Payer: Medicare HMO

## 2019-09-08 DIAGNOSIS — I350 Nonrheumatic aortic (valve) stenosis: Secondary | ICD-10-CM

## 2019-09-08 NOTE — Progress Notes (Signed)
ABI exam has been performed.  Jimmy Lecil Tapp RDCS, RVT

## 2019-09-08 NOTE — Progress Notes (Signed)
Complete echocardiogram has been performed.  Jimmy Krisandra Bueno RDCS, RVT 

## 2019-09-12 ENCOUNTER — Telehealth: Payer: Self-pay

## 2019-09-12 ENCOUNTER — Other Ambulatory Visit: Payer: Self-pay

## 2019-09-12 ENCOUNTER — Ambulatory Visit: Payer: Medicare HMO | Admitting: Cardiology

## 2019-09-12 ENCOUNTER — Encounter: Payer: Self-pay | Admitting: Cardiology

## 2019-09-12 VITALS — BP 140/76 | HR 77 | Temp 98.2°F | Ht 68.0 in | Wt 109.0 lb

## 2019-09-12 DIAGNOSIS — I350 Nonrheumatic aortic (valve) stenosis: Secondary | ICD-10-CM | POA: Diagnosis not present

## 2019-09-12 DIAGNOSIS — I10 Essential (primary) hypertension: Secondary | ICD-10-CM

## 2019-09-12 DIAGNOSIS — N183 Chronic kidney disease, stage 3 unspecified: Secondary | ICD-10-CM | POA: Diagnosis not present

## 2019-09-12 NOTE — Telephone Encounter (Signed)
Spoke with patient regarding results and recommendation. She agrees to come into the office today at 2:35 pm  Patient verbalizes understanding and is agreeable to plan of care. Advised patient to call back with any issues or concerns.

## 2019-09-12 NOTE — Patient Instructions (Signed)
Medication Instructions:  Your physician recommends that you continue on your current medications as directed. Please refer to the Current Medication list given to you today.  *If you need a refill on your cardiac medications before your next appointment, please call your pharmacy*   Lab Work: None If you have labs (blood work) drawn today and your tests are completely normal, you will receive your results only by: MyChart Message (if you have MyChart) OR A paper copy in the mail If you have any lab test that is abnormal or we need to change your treatment, we will call you to review the results.   Testing/Procedures: None   Follow-Up: At [facility], you and your health needs are our priority.  As part of our continuing mission to provide you with exceptional heart care, we have created designated Provider Care Teams.  These Care Teams include your primary Cardiologist (physician) and Advanced Practice Providers (APPs -  Physician Assistants and Nurse Practitioners) who all work together to provide you with the care you need, when you need it.  We recommend signing up for the patient portal called "MyChart".  Sign up information is provided on this After Visit Summary.  MyChart is used to connect with patients for Virtual Visits (Telemedicine).  Patients are able to view lab/test results, encounter notes, upcoming appointments, etc.  Non-urgent messages can be sent to your provider as well.   To learn more about what you can do with MyChart, go to [email or web address].    Your next appointment:   4 week(s)  The format for your next appointment:   In Person  Provider:   Brian Munley, MD   Other Instructions   

## 2019-09-12 NOTE — Telephone Encounter (Signed)
-----   Message from Richardo Priest, MD sent at 09/10/2019 10:28 AM EDT ----- Normal or stable result  I need to see her in the office her valve problem aortic stenosis has now become very severe and we need to discuss treatment.  There is Zigmund Daniel scheduled on Monday he is presently in the hospital I doubt he will come to the office and if she agrees please put her into that slot.

## 2019-09-12 NOTE — H&P (View-Only) (Signed)
Cardiology Office Note:    Date:  09/12/2019   ID:  Carmen Blackwell, DOB 1930-10-07, MRN [id number]  PCP:  Algis Greenhouse, MD  Cardiologist:  Shirlee More, MD    Referring MD: Algis Greenhouse, MD   She phoned the office has made arrangements for her daughter to come and stay with her and at the last visit we had reviewed the risks benefits and options of coronary angiography right and left heart catheterization and she agrees to proceed.  And will be set up in the next week and have alerted Dr. Burt Knack from the TAVR team.   ASSESSMENT:    1. Nonrheumatic aortic valve stenosis   2. Essential hypertension   3. Stage 3 chronic kidney disease, unspecified whether stage 3a or 3b CKD    PLAN:    In order of problems listed above:  1. Aortic stenosis is severe she is minimally symptomatic at this time repeat velocity approaches 5 m severe critical and she understands of necessity of intervention she needs time to sort out her personal life to make healthcare plans and will follow u p in 4 to 6 weeks with a decision. 2. Stable hypertension presently not on antihypertensive agents 3. Stable continue her statin   Next appointment: 4 to 6 weeks   Medication Adjustments/Labs and Tests Ordered: Current medicines are reviewed at length with the patient today.  Concerns regarding medicines are outlined above.  No orders of the defined types were placed in this encounter.  No orders of the defined types were placed in this encounter.   Chief Complaint  Patient presents with  . Follow-up    Test Results     History of Present Illness:    Carmen Blackwell is a 86 y.o. female with a hx of hypertension hyperlipidemia Sjogren's syndrome and MALT lymphoma in remission last seen 08/16/2019 for aortic stenosis.  Echocardiogram performed 09/08/2019 showed severe aortic stenosis with a valve area 0.67 square and mean gradient 61 mmHg. Compliance with diet, lifestyle and medications: Yes  She  has good healthcare literacy came back to the office reviewed the results of her echocardiogram.  She would like to proceed with TAVR however she has financial problems she needs to take care of things she consulted in the next 4 to 6 weeks involving a properly and ownership and needs to make arrangements for her daughter to come back to [place] to participate in her care or consider rehabilitation stay.  She is asymptomatic from her severe aortic stenosis and will reschedule an office follow-up in 4 to 6 weeks to come to a decision.  I told her the natural history of the disease is to progress quickly and that she decides not to have intervention either care would become palliative.  She is not having edema shortness of breath chest pain or syncope.  She is becoming increasingly weak but still lives independently and walks with a cane for assistance. Past Medical History:  Diagnosis Date  . Acquired hypothyroidism 04/09/2015  . Aortic stenosis, moderate 06/03/2017  . Atrophic vaginitis 12/30/2017   Formatting of this note might be different from the original. 2019: ERT vaginally  . Bacterial food poisoning 07/20/2019   Formatting of this note might be different from the original. 2021  . Benign positional vertigo 04/12/2019   Formatting of this note might be different from the original. 2916, 2020  . Chronic kidney disease, stage III (moderate) 04/09/2015  . Chronic right-sided low back pain without sciatica 08/07/2017  .  Chronic vulvitis 11/02/2018   Formatting of this note might be different from the original. 2020  . Cough 08/28/2015  . Essential hypertension 08/28/2015  . Familial hypercholesterolemia 04/09/2015  . GERD (gastroesophageal reflux disease) 04/09/2015  . Heart murmur   . Hyperlipidemia 08/28/2015  . Hypertension 08/28/2015  . Lumbosacral radiculopathy at L5 06/18/2018   Formatting of this note might be different from the original. 2020  . MALT lymphoma ([place]) 10/10/2015  . Mixed  hyperlipidemia 08/28/2015  . Osteopenia 06/02/2017  . Pain in joints 08/28/2015  . Pancytopenia ([place]) 10/10/2015  . Primary osteoarthritis involving multiple joints 05/07/2018  . Raynaud's disease without gangrene 07/20/2019   Formatting of this note might be different from the original. 2020: onset  . Risk for falls 07/14/2016  . Sciatica 06/02/2017  . Sjogren's syndrome ([place])   . Throat burning 12/16/2017   Formatting of this note might be different from the original. 2019: chronic  . Thrombocytopenia ([place]) 03/09/2017  . Thyroid disease   . Urge incontinence 05/06/2016    Past Surgical History:  Procedure Laterality Date  . ABDOMINAL HYSTERECTOMY    . APPENDECTOMY    . LUMBAR DISC SURGERY    . TONSILLECTOMY      Current Medications: Current Meds  Medication Sig  . Calcium Carb-Cholecalciferol (CALCIUM 600+D3 PO) Take 1 capsule by mouth daily.  . CEQUA 0.09 % SOLN Apply 1 drop to eye 2 (two) times daily.  Marland Kitchen estradiol (ESTRACE) 0.1 MG/GM vaginal cream PLACE 2 GRAMS VAGINALLY EVERY 3 DAYS  . hydroxychloroquine (PLAQUENIL) 200 MG tablet Take 1 tablet by mouth daily.  Marland Kitchen levothyroxine (SYNTHROID, LEVOTHROID) 50 MCG tablet Take 1 tablet by mouth daily.  Marland Kitchen lovastatin (MEVACOR) 20 MG tablet TAKE 1 TABLET BY MOUTH EVERY DAY  . MAGNESIUM GLUCONATE PO Take 400 mg by mouth daily.   . Meclizine HCl 25 MG CHEW Chew 1 tablet by mouth as needed.  . potassium gluconate 595 (99 K) MG TABS tablet Take 595 mg by mouth daily.   . predniSONE (DELTASONE) 5 MG tablet Take 1 tablet by mouth daily.  . traMADol (ULTRAM) 50 MG tablet Take 1 tablet by mouth as needed.     Allergies:   Nsaids, Oxybutynin, Requip [ropinirole hcl], and Ropinirole   Social History   Socioeconomic History  . Marital status: Divorced    Spouse name: Not on file  . Number of children: 1  . Years of education: 66  . Highest education level: Not on file  Occupational History  . Not on file  Tobacco Use  . Smoking status: Never  Smoker  . Smokeless tobacco: Never Used  Substance and Sexual Activity  . Alcohol use: No  . Drug use: No  . Sexual activity: Not on file  Other Topics Concern  . Not on file  Social History Narrative   Lives alone in a 2 story home.  Has one child.  Retired.  Education: some college.    Social Determinants of Health   Financial Resource Strain:   . Difficulty of Paying Living Expenses:   Food Insecurity:   . Worried About Charity fundraiser in the Last Year:   . Arboriculturist in the Last Year:   Transportation Needs:   . Film/video editor (Medical):   Marland Kitchen Lack of Transportation (Non-Medical):   Physical Activity:   . Days of Exercise per Week:   . Minutes of Exercise per Session:   Stress:   . Feeling of  Stress :   Social Connections:   . Frequency of Communication with Friends and Family:   . Frequency of Social Gatherings with Friends and Family:   . Attends Religious Services:   . Active Member of Clubs or Organizations:   . Attends Archivist Meetings:   Marland Kitchen Marital Status:      Family History: The patient's family history includes Heart failure in her brother; Other in her mother. There is no history of CAD, Heart attack, or Heart disease. ROS:   Please see the history of present illness.    All other systems reviewed and are negative.  EKGs/Labs/Other Studies Reviewed:    The following studies were reviewed today:    Recent Labs: 08/16/2019: ALT 14; BUN 18; Creatinine, Ser 1.02; Hemoglobin 12.4; NT-Pro BNP 481; Platelets 119; Potassium 4.3; Sodium 143; TSH 2.340  Recent Lipid Panel    Component Value Date/Time   CHOL 170 08/16/2019 1357   TRIG 71 08/16/2019 1357   HDL 89 08/16/2019 1357   CHOLHDL 1.9 08/16/2019 1357   LDLCALC 68 08/16/2019 1357    Physical Exam:    VS:  BP 140/76   Pulse 77   Temp 98.2 F (36.8 C)   Ht 5\' 8"  (1.727 m)   Wt 109 lb (49.4 kg)   SpO2 95%   BMI 16.57 kg/m     Wt Readings from Last 3 Encounters:    09/12/19 109 lb (49.4 kg)  08/16/19 109 lb (49.4 kg)  06/28/18 107 lb 6.4 oz (48.7 kg)     GEN:  Well nourished, well developed in no acute distress HEENT: Normal NECK: No JVD; No carotid bruits LYMPHATICS: No lymphadenopathy CARDIAC: RRR, no murmurs, rubs, gallops RESPIRATORY:  Clear to auscultation without rales, wheezing or rhonchi  ABDOMEN: Soft, non-tender, non-distended MUSCULOSKELETAL:  No edema; No deformity  SKIN: Warm and dry NEUROLOGIC:  Alert and oriented x 3 PSYCHIATRIC:  Normal affect    Signed, Shirlee More, MD  09/12/2019 3:12 PM    [facility]

## 2019-09-12 NOTE — Progress Notes (Addendum)
Cardiology Office Note:    Date:  09/12/2019   ID:  Carmen Blackwell, DOB January 16, 1931, MRN [id number]  PCP:  Algis Greenhouse, MD  Cardiologist:  Shirlee More, MD    Referring MD: Algis Greenhouse, MD   She phoned the office has made arrangements for her daughter to come and stay with her and at the last visit we had reviewed the risks benefits and options of coronary angiography right and left heart catheterization and she agrees to proceed.  And will be set up in the next week and have alerted Dr. Burt Knack from the TAVR team.   ASSESSMENT:    1. Nonrheumatic aortic valve stenosis   2. Essential hypertension   3. Stage 3 chronic kidney disease, unspecified whether stage 3a or 3b CKD    PLAN:    In order of problems listed above:  1. Aortic stenosis is severe she is minimally symptomatic at this time repeat velocity approaches 5 m severe critical and she understands of necessity of intervention she needs time to sort out her personal life to make healthcare plans and will follow u p in 4 to 6 weeks with a decision. 2. Stable hypertension presently not on antihypertensive agents 3. Stable continue her statin   Next appointment: 4 to 6 weeks   Medication Adjustments/Labs and Tests Ordered: Current medicines are reviewed at length with the patient today.  Concerns regarding medicines are outlined above.  No orders of the defined types were placed in this encounter.  No orders of the defined types were placed in this encounter.   Chief Complaint  Patient presents with  . Follow-up    Test Results     History of Present Illness:    Carmen Blackwell is a 86 y.o. female with a hx of hypertension hyperlipidemia Sjogren's syndrome and MALT lymphoma in remission last seen 08/16/2019 for aortic stenosis.  Echocardiogram performed 09/08/2019 showed severe aortic stenosis with a valve area 0.67 square and mean gradient 61 mmHg. Compliance with diet, lifestyle and medications: Yes  She  has good healthcare literacy came back to the office reviewed the results of her echocardiogram.  She would like to proceed with TAVR however she has financial problems she needs to take care of things she consulted in the next 4 to 6 weeks involving a properly and ownership and needs to make arrangements for her daughter to come back to [place] to participate in her care or consider rehabilitation stay.  She is asymptomatic from her severe aortic stenosis and will reschedule an office follow-up in 4 to 6 weeks to come to a decision.  I told her the natural history of the disease is to progress quickly and that she decides not to have intervention either care would become palliative.  She is not having edema shortness of breath chest pain or syncope.  She is becoming increasingly weak but still lives independently and walks with a cane for assistance. Past Medical History:  Diagnosis Date  . Acquired hypothyroidism 04/09/2015  . Aortic stenosis, moderate 06/03/2017  . Atrophic vaginitis 12/30/2017   Formatting of this note might be different from the original. 2019: ERT vaginally  . Bacterial food poisoning 07/20/2019   Formatting of this note might be different from the original. 2021  . Benign positional vertigo 04/12/2019   Formatting of this note might be different from the original. 2916, 2020  . Chronic kidney disease, stage III (moderate) 04/09/2015  . Chronic right-sided low back pain without sciatica 08/07/2017  .  Chronic vulvitis 11/02/2018   Formatting of this note might be different from the original. 2020  . Cough 08/28/2015  . Essential hypertension 08/28/2015  . Familial hypercholesterolemia 04/09/2015  . GERD (gastroesophageal reflux disease) 04/09/2015  . Heart murmur   . Hyperlipidemia 08/28/2015  . Hypertension 08/28/2015  . Lumbosacral radiculopathy at L5 06/18/2018   Formatting of this note might be different from the original. 2020  . MALT lymphoma ([place]) 10/10/2015  . Mixed  hyperlipidemia 08/28/2015  . Osteopenia 06/02/2017  . Pain in joints 08/28/2015  . Pancytopenia ([place]) 10/10/2015  . Primary osteoarthritis involving multiple joints 05/07/2018  . Raynaud's disease without gangrene 07/20/2019   Formatting of this note might be different from the original. 2020: onset  . Risk for falls 07/14/2016  . Sciatica 06/02/2017  . Sjogren's syndrome ([place])   . Throat burning 12/16/2017   Formatting of this note might be different from the original. 2019: chronic  . Thrombocytopenia ([place]) 03/09/2017  . Thyroid disease   . Urge incontinence 05/06/2016    Past Surgical History:  Procedure Laterality Date  . ABDOMINAL HYSTERECTOMY    . APPENDECTOMY    . LUMBAR DISC SURGERY    . TONSILLECTOMY      Current Medications: Current Meds  Medication Sig  . Calcium Carb-Cholecalciferol (CALCIUM 600+D3 PO) Take 1 capsule by mouth daily.  . CEQUA 0.09 % SOLN Apply 1 drop to eye 2 (two) times daily.  Marland Kitchen estradiol (ESTRACE) 0.1 MG/GM vaginal cream PLACE 2 GRAMS VAGINALLY EVERY 3 DAYS  . hydroxychloroquine (PLAQUENIL) 200 MG tablet Take 1 tablet by mouth daily.  Marland Kitchen levothyroxine (SYNTHROID, LEVOTHROID) 50 MCG tablet Take 1 tablet by mouth daily.  Marland Kitchen lovastatin (MEVACOR) 20 MG tablet TAKE 1 TABLET BY MOUTH EVERY DAY  . MAGNESIUM GLUCONATE PO Take 400 mg by mouth daily.   . Meclizine HCl 25 MG CHEW Chew 1 tablet by mouth as needed.  . potassium gluconate 595 (99 K) MG TABS tablet Take 595 mg by mouth daily.   . predniSONE (DELTASONE) 5 MG tablet Take 1 tablet by mouth daily.  . traMADol (ULTRAM) 50 MG tablet Take 1 tablet by mouth as needed.     Allergies:   Nsaids, Oxybutynin, Requip [ropinirole hcl], and Ropinirole   Social History   Socioeconomic History  . Marital status: Divorced    Spouse name: Not on file  . Number of children: 1  . Years of education: 38  . Highest education level: Not on file  Occupational History  . Not on file  Tobacco Use  . Smoking status: Never  Smoker  . Smokeless tobacco: Never Used  Substance and Sexual Activity  . Alcohol use: No  . Drug use: No  . Sexual activity: Not on file  Other Topics Concern  . Not on file  Social History Narrative   Lives alone in a 2 story home.  Has one child.  Retired.  Education: some college.    Social Determinants of Health   Financial Resource Strain:   . Difficulty of Paying Living Expenses:   Food Insecurity:   . Worried About Charity fundraiser in the Last Year:   . Arboriculturist in the Last Year:   Transportation Needs:   . Film/video editor (Medical):   Marland Kitchen Lack of Transportation (Non-Medical):   Physical Activity:   . Days of Exercise per Week:   . Minutes of Exercise per Session:   Stress:   . Feeling of  Stress :   Social Connections:   . Frequency of Communication with Friends and Family:   . Frequency of Social Gatherings with Friends and Family:   . Attends Religious Services:   . Active Member of Clubs or Organizations:   . Attends Archivist Meetings:   Marland Kitchen Marital Status:      Family History: The patient's family history includes Heart failure in her brother; Other in her mother. There is no history of CAD, Heart attack, or Heart disease. ROS:   Please see the history of present illness.    All other systems reviewed and are negative.  EKGs/Labs/Other Studies Reviewed:    The following studies were reviewed today:    Recent Labs: 08/16/2019: ALT 14; BUN 18; Creatinine, Ser 1.02; Hemoglobin 12.4; NT-Pro BNP 481; Platelets 119; Potassium 4.3; Sodium 143; TSH 2.340  Recent Lipid Panel    Component Value Date/Time   CHOL 170 08/16/2019 1357   TRIG 71 08/16/2019 1357   HDL 89 08/16/2019 1357   CHOLHDL 1.9 08/16/2019 1357   LDLCALC 68 08/16/2019 1357    Physical Exam:    VS:  BP 140/76   Pulse 77   Temp 98.2 F (36.8 C)   Ht 5\' 8"  (1.727 m)   Wt 109 lb (49.4 kg)   SpO2 95%   BMI 16.57 kg/m     Wt Readings from Last 3 Encounters:    09/12/19 109 lb (49.4 kg)  08/16/19 109 lb (49.4 kg)  06/28/18 107 lb 6.4 oz (48.7 kg)     GEN:  Well nourished, well developed in no acute distress HEENT: Normal NECK: No JVD; No carotid bruits LYMPHATICS: No lymphadenopathy CARDIAC: RRR, no murmurs, rubs, gallops RESPIRATORY:  Clear to auscultation without rales, wheezing or rhonchi  ABDOMEN: Soft, non-tender, non-distended MUSCULOSKELETAL:  No edema; No deformity  SKIN: Warm and dry NEUROLOGIC:  Alert and oriented x 3 PSYCHIATRIC:  Normal affect    Signed, Shirlee More, MD  09/12/2019 3:12 PM    [facility]

## 2019-09-20 ENCOUNTER — Ambulatory Visit: Payer: Medicare HMO | Admitting: Cardiology

## 2019-09-29 ENCOUNTER — Telehealth: Payer: Self-pay

## 2019-09-29 NOTE — Telephone Encounter (Addendum)
Spoke to patient just now and she let me know that she was wondering if she should go ahead and get her COVID vaccines. I let her know that Dr. Bettina Gavia has suggested that every patient should get their COVID vaccines as soon as possible. She verbalizes understanding and does not have any other questions or concerns at this time.   I also confirmed this with Dr. Agustin Cree as he is in the clinic today and he verbalized that it would be fine for the patient to get these shots prior to her surgery. I let the patient know this as well and she verbalized understanding.    Encouraged patient to call back with any questions or concerns.

## 2019-10-03 ENCOUNTER — Telehealth: Payer: Self-pay | Admitting: Cardiology

## 2019-10-03 DIAGNOSIS — Z79899 Other long term (current) drug therapy: Secondary | ICD-10-CM

## 2019-10-03 DIAGNOSIS — R600 Localized edema: Secondary | ICD-10-CM

## 2019-10-03 MED ORDER — FUROSEMIDE 20 MG PO TABS: ORAL_TABLET | ORAL | 1 refills | Status: DC

## 2019-10-03 NOTE — Addendum Note (Signed)
Addended by: Resa Miner I on: 10/03/2019 12:13 PM   Modules accepted: Orders

## 2019-10-03 NOTE — Telephone Encounter (Signed)
Pt c/o swelling: STAT is pt has developed SOB within 24 hours  1) How much weight have you gained and in what time span? Doesn't think she has gained   2) If swelling, where is the swelling located? Ankles and Feet  3) Are you currently taking a fluid pill? No   4) Are you currently SOB? No   5) Do you have a log of your daily weights (if so, list)? No   6) Have you gained 3 pounds in a day or 5 pounds in a week? No   7) Have you traveled recently? No   Carmen Blackwell is calling stating her swelling in her ankles and feet has been progressively getting worse at a faster rate in the past week and a half. She states she has noticed when she wakes up the swelling has gone down, but as soon as she is up and moving around she begins gaining it right back at a faster rate than before. She states she is not on a fluid pill, but is beginning to wonder if she should be. Carmen Blackwell states she is not having any SOB, but is experiencing excessive fatigue from having to keep her legs elevated in her chair more. She also would like to discuss scheduling her heart valve procedure. Please advise.

## 2019-10-03 NOTE — Telephone Encounter (Signed)
Pt states that her daughter can come for 2 weeks to care for her after the procedure anytime between now June 7 up to 7/10. She wants to see if that can be arranged.  Pt is aware of the addition of Furosemide Monday, Wednesday and Friday. She verbalized understanding of labs to be done in 1 week as well.

## 2019-10-03 NOTE — Telephone Encounter (Signed)
New Message   Pt is calling and says she takes 4 medications every morning and is wondering if she needs to take them the day of her surgery   Please advise

## 2019-10-03 NOTE — Telephone Encounter (Signed)
Spoke with patient and patients daughter in regards to her cardiac catheterization. I went over the below instructions with them and they verbalize understanding. I also let them know that she is scheduled for a COVID swab on 10/07/19 at 11:10 AM and that they will also need to come into our office to get lab work done on Friday. They verbalize understanding of this and do not have any other issues or concerns at this time.      Carmen Blackwell OFFICE Carmen Blackwell, Carmen Blackwell Carmen Blackwell [place] [address] Dept: [phone] Loc: [phone]  Carmen Blackwell  10/03/2019  You are scheduled for a Cardiac Catheterization on Wednesday, June 16 with Dr. Larae Grooms.  1. Please arrive at the [facility] (Main Entrance A) at [facility]: [address] [place], [place] [address] at 9:30 AM (This time is two hours before your procedure to ensure your preparation). Free valet parking service is available.   Special note: Every effort is made to have your procedure done on time. Please understand that emergencies sometimes delay scheduled procedures.  2. Diet: Do not eat solid foods after midnight.  The patient may have clear liquids until 5am upon the day of the procedure.  3. Labs: You will need to have blood drawn on Friday, June 11 at [facility]: [address] Technical sales engineer . You do not need to be fasting.   4. Medication instructions in preparation for your procedure:   Contrast Allergy: No   Do not take your Lasix on the morning of the procedure.    5. Plan for one night stay--bring personal belongings. 6. Bring a current list of your medications and current insurance cards. 7. You MUST have a responsible person to drive you home. 8. Someone MUST be with you the first 24 hours after you arrive home or your discharge will be delayed. 9. Please wear clothes that are easy to get on and off and wear slip-on  shoes.  Thank you for allowing [place] to care for you!   -- [facility]

## 2019-10-03 NOTE — Telephone Encounter (Signed)
Spoke with the patient just now and let her know that the only medication that she needs to hold prior to the cath at this time is her Lasix. She verbalizes understanding and does not have any other issues or concerns.    Encouraged patient to call back with any questions or concerns.

## 2019-10-03 NOTE — Addendum Note (Signed)
Addended by: Truddie Hidden on: 10/03/2019 10:20 AM   Modules accepted: Orders

## 2019-10-03 NOTE — Telephone Encounter (Signed)
This edema is due to her aortic stenosis.  Lets have her start taking furosemide 20 mg 3 times a week Monday Wednesday and Friday I had like her to return back to the office after 1 week and check a BMP.  I have encouraged her to make arrangements for care so that she can have intervention for her aortic stenosis.

## 2019-10-03 NOTE — Addendum Note (Signed)
Addended by: Truddie Hidden on: 10/03/2019 10:24 AM   Modules accepted: Orders

## 2019-10-03 NOTE — Telephone Encounter (Signed)
Please schedule for left and right heart catheterization with Dr. Burt Knack next week.

## 2019-10-05 NOTE — Addendum Note (Signed)
Addended by: Shirlee More on: 10/05/2019 03:39 PM   Modules accepted: Orders, Level of Service, SmartSet

## 2019-10-06 ENCOUNTER — Other Ambulatory Visit: Payer: Self-pay

## 2019-10-06 DIAGNOSIS — I350 Nonrheumatic aortic (valve) stenosis: Secondary | ICD-10-CM

## 2019-10-07 ENCOUNTER — Other Ambulatory Visit (HOSPITAL_COMMUNITY)
Admission: RE | Admit: 2019-10-07 | Discharge: 2019-10-07 | Disposition: A | Discharge status: Home / Self Care | Payer: Medicare HMO | Source: Ambulatory Visit | Attending: Interventional Cardiology | Admitting: Interventional Cardiology

## 2019-10-07 DIAGNOSIS — Z20822 Contact with and (suspected) exposure to covid-19: Secondary | ICD-10-CM | POA: Diagnosis not present

## 2019-10-07 DIAGNOSIS — Z01812 Encounter for preprocedural laboratory examination: Secondary | ICD-10-CM | POA: Diagnosis present

## 2019-10-07 LAB — SARS CORONAVIRUS 2 (TAT 6-24 HRS): SARS Coronavirus 2: NEGATIVE

## 2019-10-11 ENCOUNTER — Telehealth: Payer: Self-pay | Admitting: *Deleted

## 2019-10-11 ENCOUNTER — Telehealth: Payer: Self-pay

## 2019-10-11 DIAGNOSIS — I350 Nonrheumatic aortic (valve) stenosis: Secondary | ICD-10-CM

## 2019-10-11 DIAGNOSIS — I10 Essential (primary) hypertension: Secondary | ICD-10-CM

## 2019-10-11 LAB — BASIC METABOLIC PANEL
BUN/Creatinine Ratio: 18 (ref 12–28)
BUN: 19 mg/dL (ref 8–27)
CO2: 23 mmol/L (ref 20–29)
Calcium: 9.4 mg/dL (ref 8.7–10.3)
Chloride: 101 mmol/L (ref 96–106)
Creatinine, Ser: 1.03 mg/dL — ABNORMAL HIGH (ref 0.57–1.00)
GFR calc Af Amer: 56 mL/min/{1.73_m2} — ABNORMAL LOW (ref >59)
GFR calc non Af Amer: 49 mL/min/{1.73_m2} — ABNORMAL LOW (ref >59)
Glucose: 87 mg/dL (ref 65–99)
Potassium: 4.7 mmol/L (ref 3.5–5.2)
Sodium: 143 mmol/L (ref 134–144)

## 2019-10-11 LAB — CBC
Hematocrit: 37 % (ref 34.0–46.6)
Hemoglobin: 12.3 g/dL (ref 11.1–15.9)
MCH: 30.7 pg (ref 26.6–33.0)
MCHC: 33.2 g/dL (ref 31.5–35.7)
MCV: 92 fL (ref 79–97)
Platelets: 112 10*3/uL — ABNORMAL LOW (ref 150–450)
RBC: 4.01 x10E6/uL (ref 3.77–5.28)
RDW: 12.8 % (ref 11.7–15.4)
WBC: 4.8 10*3/uL (ref 3.4–10.8)

## 2019-10-11 NOTE — Telephone Encounter (Addendum)
Pt contacted pre-catheterization scheduled at [facility] for: Wednesday October 12, 2019 11:30 AM Verified arrival time and place: [facility]) at: 9:30 AM   No solid food after midnight prior to cath, clear liquids until 5 AM day of procedure.  Hold: Lasix/KCl-day of procedure-GFR 49-pt does take on Tuesdays  Except hold medications AM meds can be  taken pre-cath with sips of water including: ASA 81 mg   Confirmed patient has responsible adult to drive home post procedure and observe 24 hours after arriving home: yes  You are allowed ONE visitor in the waiting room during your procedure. Both you and your visitor must wear masks.      COVID-19 Pre-Screening Questions:  . In the past 7 to 10 days have you had a new cough, shortness of breath, headache, congestion, fever (100 or greater) unexplained body aches, new sore throat, or sudden loss of taste or sense of smell? No . In the past 7 to 10 days have you been around anyone with known Covid 19? no  Reviewed procedure/mask/visitor instructions, COVID-19 screening questions with patient.

## 2019-10-11 NOTE — Telephone Encounter (Signed)
Spoke with patient regarding results and recommendation.  Patient verbalizes understanding and is agreeable to plan of care. Advised patient to call back with any issues or concerns.  

## 2019-10-11 NOTE — Telephone Encounter (Signed)
Spoke to the patient just now and let her know that she needs to come into our office to have a STAT CBC drawn prior to her procedure tomorrow. She verbalizes understanding and states that she will do this.   Encouraged patient to call back with any questions or concerns.

## 2019-10-11 NOTE — Telephone Encounter (Signed)
-----   Message from Richardo Priest, MD sent at 10/11/2019  9:43 AM EDT ----- Normal or stable result

## 2019-10-12 ENCOUNTER — Ambulatory Visit (HOSPITAL_COMMUNITY)
Admission: RE | Admit: 2019-10-12 | Discharge: 2019-10-12 | Disposition: A | Discharge status: Home / Self Care | Payer: Medicare HMO | Attending: Interventional Cardiology | Admitting: Interventional Cardiology

## 2019-10-12 ENCOUNTER — Other Ambulatory Visit: Payer: Self-pay

## 2019-10-12 ENCOUNTER — Encounter (HOSPITAL_COMMUNITY)
Admission: RE | Disposition: A | Discharge status: Home / Self Care | Payer: Self-pay | Source: Home / Self Care | Attending: Interventional Cardiology

## 2019-10-12 DIAGNOSIS — Z79890 Hormone replacement therapy (postmenopausal): Secondary | ICD-10-CM | POA: Insufficient documentation

## 2019-10-12 DIAGNOSIS — E782 Mixed hyperlipidemia: Secondary | ICD-10-CM | POA: Diagnosis not present

## 2019-10-12 DIAGNOSIS — Z79899 Other long term (current) drug therapy: Secondary | ICD-10-CM | POA: Insufficient documentation

## 2019-10-12 DIAGNOSIS — I129 Hypertensive chronic kidney disease with stage 1 through stage 4 chronic kidney disease, or unspecified chronic kidney disease: Secondary | ICD-10-CM | POA: Diagnosis not present

## 2019-10-12 DIAGNOSIS — I2510 Atherosclerotic heart disease of native coronary artery without angina pectoris: Secondary | ICD-10-CM | POA: Diagnosis not present

## 2019-10-12 DIAGNOSIS — I350 Nonrheumatic aortic (valve) stenosis: Secondary | ICD-10-CM | POA: Diagnosis present

## 2019-10-12 DIAGNOSIS — E7801 Familial hypercholesterolemia: Secondary | ICD-10-CM | POA: Diagnosis not present

## 2019-10-12 DIAGNOSIS — C884 Extranodal marginal zone B-cell lymphoma of mucosa-associated lymphoid tissue [MALT-lymphoma]: Secondary | ICD-10-CM | POA: Diagnosis not present

## 2019-10-12 DIAGNOSIS — E039 Hypothyroidism, unspecified: Secondary | ICD-10-CM | POA: Diagnosis not present

## 2019-10-12 DIAGNOSIS — N183 Chronic kidney disease, stage 3 unspecified: Secondary | ICD-10-CM | POA: Insufficient documentation

## 2019-10-12 DIAGNOSIS — K219 Gastro-esophageal reflux disease without esophagitis: Secondary | ICD-10-CM | POA: Diagnosis not present

## 2019-10-12 DIAGNOSIS — M3500 Sicca syndrome, unspecified: Secondary | ICD-10-CM | POA: Diagnosis not present

## 2019-10-12 DIAGNOSIS — I7300 Raynaud's syndrome without gangrene: Secondary | ICD-10-CM | POA: Insufficient documentation

## 2019-10-12 HISTORY — PX: RIGHT/LEFT HEART CATH AND CORONARY ANGIOGRAPHY: CATH118266

## 2019-10-12 LAB — POCT I-STAT 7, (LYTES, BLD GAS, ICA,H+H)
Acid-Base Excess: 5 mmol/L — ABNORMAL HIGH (ref 0.0–2.0)
Bicarbonate: 30.2 mmol/L — ABNORMAL HIGH (ref 20.0–28.0)
Calcium, Ion: 1.24 mmol/L (ref 1.15–1.40)
HCT: 31 % — ABNORMAL LOW (ref 36.0–46.0)
Hemoglobin: 10.5 g/dL — ABNORMAL LOW (ref 12.0–15.0)
O2 Saturation: 100 %
Potassium: 4 mmol/L (ref 3.5–5.1)
Sodium: 141 mmol/L (ref 135–145)
TCO2: 32 mmol/L (ref 22–32)
pCO2 arterial: 48 mmHg (ref 32.0–48.0)
pH, Arterial: 7.406 (ref 7.350–7.450)
pO2, Arterial: 230 mmHg — ABNORMAL HIGH (ref 83.0–108.0)

## 2019-10-12 LAB — POCT I-STAT EG7
Acid-Base Excess: 3 mmol/L — ABNORMAL HIGH (ref 0.0–2.0)
Bicarbonate: 28.9 mmol/L — ABNORMAL HIGH (ref 20.0–28.0)
Calcium, Ion: 1.2 mmol/L (ref 1.15–1.40)
HCT: 31 % — ABNORMAL LOW (ref 36.0–46.0)
Hemoglobin: 10.5 g/dL — ABNORMAL LOW (ref 12.0–15.0)
O2 Saturation: 80 %
Potassium: 3.7 mmol/L (ref 3.5–5.1)
Sodium: 142 mmol/L (ref 135–145)
TCO2: 30 mmol/L (ref 22–32)
pCO2, Ven: 49.6 mmHg (ref 44.0–60.0)
pH, Ven: 7.373 (ref 7.250–7.430)
pO2, Ven: 46 mmHg — ABNORMAL HIGH (ref 32.0–45.0)

## 2019-10-12 SURGERY — RIGHT/LEFT HEART CATH AND CORONARY ANGIOGRAPHY
Anesthesia: LOCAL

## 2019-10-12 MED ORDER — HEPARIN SODIUM (PORCINE) 1000 UNIT/ML IJ SOLN
INTRAMUSCULAR | Status: AC
  Filled 2019-10-12: qty 1

## 2019-10-12 MED ORDER — LIDOCAINE HCL (PF) 1 % IJ SOLN
INTRAMUSCULAR | Status: AC
  Filled 2019-10-12: qty 30

## 2019-10-12 MED ORDER — SODIUM CHLORIDE 0.9% FLUSH: 3.0000 mL | INTRAVENOUS | Status: DC | PRN

## 2019-10-12 MED ORDER — VERAPAMIL HCL 2.5 MG/ML IV SOLN
INTRAVENOUS | Status: AC
  Filled 2019-10-12: qty 2

## 2019-10-12 MED ORDER — HYDRALAZINE HCL 20 MG/ML IJ SOLN: 10.0000 mg | INTRAMUSCULAR | Status: DC | PRN

## 2019-10-12 MED ORDER — MIDAZOLAM HCL 2 MG/2ML IJ SOLN
INTRAMUSCULAR | Status: AC
  Filled 2019-10-12: qty 2

## 2019-10-12 MED ORDER — SODIUM CHLORIDE 0.9 % IV SOLN: 250.0000 mL | INTRAVENOUS | Status: DC | PRN

## 2019-10-12 MED ORDER — LIDOCAINE HCL (PF) 1 % IJ SOLN
INTRAMUSCULAR | Status: DC | PRN
  Administered 2019-10-12: 5 mL

## 2019-10-12 MED ORDER — ONDANSETRON HCL 4 MG/2ML IJ SOLN: 4.0000 mg | Freq: Four times a day (QID) | INTRAMUSCULAR | Status: DC | PRN

## 2019-10-12 MED ORDER — SODIUM CHLORIDE 0.9 % WEIGHT BASED INFUSION: 1.0000 mL/kg/h | INTRAVENOUS | Status: DC

## 2019-10-12 MED ORDER — FENTANYL CITRATE (PF) 100 MCG/2ML IJ SOLN
INTRAMUSCULAR | Status: DC | PRN
  Administered 2019-10-12: 25 ug via INTRAVENOUS

## 2019-10-12 MED ORDER — FENTANYL CITRATE (PF) 100 MCG/2ML IJ SOLN
INTRAMUSCULAR | Status: AC
  Filled 2019-10-12: qty 2

## 2019-10-12 MED ORDER — IOHEXOL 350 MG/ML SOLN
INTRAVENOUS | Status: DC | PRN
  Administered 2019-10-12: 50 mL

## 2019-10-12 MED ORDER — ACETAMINOPHEN 325 MG PO TABS: 650.0000 mg | ORAL_TABLET | ORAL | Status: DC | PRN

## 2019-10-12 MED ORDER — SODIUM CHLORIDE 0.9 % WEIGHT BASED INFUSION
3.0000 mL/kg/h | INTRAVENOUS | Status: AC
  Administered 2019-10-12: 3 mL/kg/h via INTRAVENOUS

## 2019-10-12 MED ORDER — LABETALOL HCL 5 MG/ML IV SOLN: 10.0000 mg | INTRAVENOUS | Status: DC | PRN

## 2019-10-12 MED ORDER — HEPARIN SODIUM (PORCINE) 1000 UNIT/ML IJ SOLN
INTRAMUSCULAR | Status: DC | PRN
  Administered 2019-10-12: 2000 [IU] via INTRAVENOUS

## 2019-10-12 MED ORDER — MIDAZOLAM HCL 2 MG/2ML IJ SOLN
INTRAMUSCULAR | Status: DC | PRN
  Administered 2019-10-12: 1 mg via INTRAVENOUS

## 2019-10-12 MED ORDER — SODIUM CHLORIDE 0.9 % IV SOLN: INTRAVENOUS | Status: AC

## 2019-10-12 MED ORDER — SODIUM CHLORIDE 0.9% FLUSH: 3.0000 mL | Freq: Two times a day (BID) | INTRAVENOUS | Status: DC

## 2019-10-12 MED ORDER — VERAPAMIL HCL 2.5 MG/ML IV SOLN
INTRAVENOUS | Status: DC | PRN
  Administered 2019-10-12: 10 mL via INTRA_ARTERIAL

## 2019-10-12 MED ORDER — HEPARIN (PORCINE) IN NACL 1000-0.9 UT/500ML-% IV SOLN
INTRAVENOUS | Status: DC | PRN
  Administered 2019-10-12 (×2): 500 mL

## 2019-10-12 SURGICAL SUPPLY — 16 items
CATH 5FR JL3.5 JR4 ANG PIG MP (CATHETERS) ×1 IMPLANT
CATH BALLN WEDGE 5F 110CM (CATHETERS) ×1 IMPLANT
CATH INFINITI 5FR AL1 (CATHETERS) ×1 IMPLANT
DEVICE RAD COMP TR BAND LRG (VASCULAR PRODUCTS) ×1 IMPLANT
GLIDESHEATH SLEND SS 6F .021 (SHEATH) ×1 IMPLANT
GUIDEWIRE .025 260CM (WIRE) ×1 IMPLANT
GUIDEWIRE INQWIRE 1.5J.035X260 (WIRE) IMPLANT
INQWIRE 1.5J .035X260CM (WIRE) ×2
KIT HEART LEFT (KITS) ×2 IMPLANT
PACK CARDIAC CATHETERIZATION (CUSTOM PROCEDURE TRAY) ×2 IMPLANT
SHEATH GLIDE SLENDER 4/5FR (SHEATH) ×1 IMPLANT
SHEATH PROBE COVER 6X72 (BAG) ×1 IMPLANT
SYR MEDRAD MARK 7 150ML (SYRINGE) ×2 IMPLANT
TRANSDUCER W/STOPCOCK (MISCELLANEOUS) ×2 IMPLANT
TUBING CIL FLEX 10 FLL-RA (TUBING) ×2 IMPLANT
WIRE EMERALD ST .035X150CM (WIRE) ×1 IMPLANT

## 2019-10-12 NOTE — Interval H&P Note (Signed)
History and Physical Interval Note:  10/12/2019 11:03 AM  Carmen Blackwell  has presented today for surgery, with the diagnosis of cad.  The various methods of treatment have been discussed with the patient and family. After consideration of risks, benefits and other options for treatment, the patient has consented to  Procedure(s): RIGHT/LEFT HEART CATH AND CORONARY ANGIOGRAPHY (N/A) as a surgical intervention.  The patient's history has been reviewed, patient examined, no change in status, stable for surgery.  I have reviewed the patient's chart and labs.  Questions were answered to the patient's satisfaction.    No planned PCI, diagnostic only pre AVR.  Larae Grooms

## 2019-10-12 NOTE — Discharge Instructions (Signed)
Radial Site Care  This sheet gives you information about how to care for yourself after your procedure. Your health care provider may also give you more specific instructions. If you have problems or questions, contact your health care provider. What can I expect after the procedure? After the procedure, it is common to have:  Bruising and tenderness at the catheter insertion area. Follow these instructions at home: Medicines  Take over-the-counter and prescription medicines only as told by your health care provider. Insertion site care  Follow instructions from your health care provider about how to take care of your insertion site. Make sure you: ? Wash your hands with soap and water before you change your bandage (dressing). If soap and water are not available, use hand sanitizer. ? Change your dressing as told by your health care provider. ? Leave stitches (sutures), skin glue, or adhesive strips in place. These skin closures may need to stay in place for 2 weeks or longer. If adhesive strip edges start to loosen and curl up, you may trim the loose edges. Do not remove adhesive strips completely unless your health care provider tells you to do that.  Check your insertion site every day for signs of infection. Check for: ? Redness, swelling, or pain. ? Fluid or blood. ? Pus or a bad smell. ? Warmth.  Do not take baths, swim, or use a hot tub until your health care provider approves.  You may shower 24-48 hours after the procedure, or as directed by your health care provider. ? Remove the dressing and gently wash the site with plain soap and water. ? Pat the area dry with a clean towel. ? Do not rub the site. That could cause bleeding.  Do not apply powder or lotion to the site. Activity   For 24 hours after the procedure, or as directed by your health care provider: ? Do not flex or bend the affected arm. ? Do not push or pull heavy objects with the affected arm. ? Do not  drive yourself home from the hospital or clinic. You may drive 24 hours after the procedure unless your health care provider tells you not to. ? Do not operate machinery or power tools.  Do not lift anything that is heavier than 10 lb (4.5 kg), or the limit that you are told, until your health care provider says that it is safe.  Ask your health care provider when it is okay to: ? Return to work or school. ? Resume usual physical activities or sports. ? Resume sexual activity. General instructions  If the catheter site starts to bleed, raise your arm and put firm pressure on the site. If the bleeding does not stop, get help right away. This is a medical emergency.  If you went home on the same day as your procedure, a responsible adult should be with you for the first 24 hours after you arrive home.  Keep all follow-up visits as told by your health care provider. This is important. Contact a health care provider if:  You have a fever.  You have redness, swelling, or yellow drainage around your insertion site. Get help right away if:  You have unusual pain at the radial site.  The catheter insertion area swells very fast.  The insertion area is bleeding, and the bleeding does not stop when you hold steady pressure on the area.  Your arm or hand becomes pale, cool, tingly, or numb. These symptoms may represent a serious problem   that is an emergency. Do not wait to see if the symptoms will go away. Get medical help right away. Call your local emergency services (911 in the [place] Do not drive yourself to the hospital. Summary  After the procedure, it is common to have bruising and tenderness at the site.  Follow instructions from your health care provider about how to take care of your radial site wound. Check the wound every day for signs of infection.  Do not lift anything that is heavier than 10 lb (4.5 kg), or the limit that you are told, until your health care provider says  that it is safe. This information is not intended to replace advice given to you by your health care provider. Make sure you discuss any questions you have with your health care provider. Document Revised: 05/20/2017 Document Reviewed: 05/20/2017 Elsevier Patient Education  2020 Elsevier Inc.  

## 2019-10-13 ENCOUNTER — Encounter (HOSPITAL_COMMUNITY): Payer: Self-pay | Admitting: Interventional Cardiology

## 2019-10-17 ENCOUNTER — Telehealth: Payer: Self-pay | Admitting: Cardiology

## 2019-10-17 ENCOUNTER — Encounter: Payer: Self-pay | Admitting: Physician Assistant

## 2019-10-17 NOTE — Telephone Encounter (Signed)
Spoke to the patient just now. She made me aware that she is wanting to cancel this appointment until after her surgery. I let her know that this would be fine and to call us when she was ready to reschedule.

## 2019-10-17 NOTE — Telephone Encounter (Signed)
Yes if she has a video link and home digital BP device

## 2019-10-17 NOTE — Telephone Encounter (Signed)
New message   Patient wants to know if appt with Shirlee More can be virtual. Please advise.

## 2019-10-18 DIAGNOSIS — I350 Nonrheumatic aortic (valve) stenosis: Secondary | ICD-10-CM

## 2019-10-19 ENCOUNTER — Other Ambulatory Visit: Payer: Self-pay

## 2019-10-19 ENCOUNTER — Ambulatory Visit (HOSPITAL_BASED_OUTPATIENT_CLINIC_OR_DEPARTMENT_OTHER)
Admission: RE | Admit: 2019-10-19 | Discharge: 2019-10-19 | Disposition: A | Discharge status: Home / Self Care | Payer: Medicare HMO | Source: Ambulatory Visit | Attending: Cardiovascular Disease | Admitting: Cardiovascular Disease

## 2019-10-19 ENCOUNTER — Institutional Professional Consult (permissible substitution): Payer: Medicare HMO | Admitting: Cardiovascular Disease

## 2019-10-19 ENCOUNTER — Encounter (HOSPITAL_COMMUNITY): Payer: Self-pay

## 2019-10-19 ENCOUNTER — Encounter: Payer: Self-pay | Admitting: Cardiovascular Disease

## 2019-10-19 ENCOUNTER — Ambulatory Visit (INDEPENDENT_AMBULATORY_CARE_PROVIDER_SITE_OTHER): Payer: Medicare HMO | Admitting: Cardiovascular Disease

## 2019-10-19 ENCOUNTER — Ambulatory Visit (HOSPITAL_COMMUNITY)
Admission: RE | Admit: 2019-10-19 | Discharge: 2019-10-19 | Disposition: A | Discharge status: Home / Self Care | Payer: Medicare HMO | Source: Ambulatory Visit | Attending: Cardiovascular Disease | Admitting: Cardiovascular Disease

## 2019-10-19 VITALS — BP 120/60 | HR 73 | Ht 67.0 in | Wt 106.0 lb

## 2019-10-19 DIAGNOSIS — I350 Nonrheumatic aortic (valve) stenosis: Secondary | ICD-10-CM

## 2019-10-19 MED ORDER — IOHEXOL 350 MG/ML SOLN
80.0000 mL | Freq: Once | INTRAVENOUS | Status: AC | PRN
  Administered 2019-10-19: 80 mL via INTRAVENOUS

## 2019-10-19 NOTE — Progress Notes (Signed)
Carotid artery duplex completed. Refer to "CV Proc" under chart review to view preliminary results.  10/19/2019 1:36 PM Kelby Aline., MHA, RVT, RDCS, RDMS

## 2019-10-19 NOTE — Patient Instructions (Signed)
Medication Instructions:  No changes  *If you need a refill on your cardiac medications before your next appointment, please call your pharmacy*   Lab Work: none If you have labs (blood work) drawn today and your tests are completely normal, you will receive your results only by:  [place] (if you have MyChart) OR  A paper copy in the mail If you have any lab test that is abnormal or we need to change your treatment, we will call you to review the results.   Testing/Procedures: none   Other Instructions Follow up as planned

## 2019-10-19 NOTE — Progress Notes (Signed)
[facility] Consult Note  Chief Complaint  Patient presents with  . New Patient (Initial Visit)    Severe aortic stenosis   History of Present Illness: 86 yo female with history of hypothyroidism, vertigo, CKD stage 3, GERD, HLD, MALT lymphoma, pancytopenia, Sjogren's syndrome and critical aortic stenosis who is referred today as a new consult by Dr. Bettina Gavia for further discussion regarding her aortic stenosis and possible TAVR. Echo 09/08/19 with LVEF=55-60%. Trivial MR. The aortic valve is heavily thickened and calcified with critical stenosis. Mean gradient 61 mmHg, peak gradient 88.4 mmHg, AVA 0.6 cm2, dimensionless index 0.19. Cardiac cath 10/12/19 with mild non-obstructive CAD. Normal right heart pressures. She has been on chronic low dose steroids.   She tells me today that she has been having dizziness but no syncope. She has had LE edema. No chest pain. She lives alone in [place]. She has full dentures.  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 Her daughter from [place] is here visiting and will be here this week during the rest of her workup for TAVR. She will be traveling home this weekend and returning next week.   Primary Care Physician: Algis Greenhouse, MD Primary Cardiologist: Bettina Gavia Referring Cardiologist: Bettina Gavia  Past Medical History:  Diagnosis Date  . Acquired hypothyroidism 04/09/2015  . Atrophic vaginitis 12/30/2017   Formatting of this note might be different from the original. 2019: ERT vaginally  . Bacterial food poisoning 07/20/2019   Formatting of this note might be different from the original. 2021  . Benign positional vertigo 04/12/2019   Formatting of this note might be different from the original. 2916, 2020  . Chronic kidney disease, stage III (moderate) 04/09/2015  . Chronic right-sided low back pain without sciatica 08/07/2017  . Cough 08/28/2015  . Essential hypertension 08/28/2015  . GERD (gastroesophageal reflux disease) 04/09/2015  .  HLD (hyperlipidemia)   . Lumbosacral radiculopathy at L5 06/18/2018   Formatting of this note might be different from the original. 2020  . MALT lymphoma ([place]) 10/10/2015  . Osteopenia 06/02/2017  . Pancytopenia ([place]) 10/10/2015  . Primary osteoarthritis involving multiple joints 05/07/2018  . Raynaud's disease without gangrene 07/20/2019   Formatting of this note might be different from the original. 2020: onset  . Risk for falls 07/14/2016  . Sciatica 06/02/2017  . Severe aortic stenosis   . Sjogren's syndrome ([place])   . Throat burning 12/16/2017   Formatting of this note might be different from the original. 2019: chronic  . Urge incontinence 05/06/2016    Past Surgical History:  Procedure Laterality Date  . ABDOMINAL HYSTERECTOMY    . APPENDECTOMY    . LUMBAR DISC SURGERY    . RIGHT/LEFT HEART CATH AND CORONARY ANGIOGRAPHY N/A 10/12/2019   Procedure: RIGHT/LEFT HEART CATH AND CORONARY ANGIOGRAPHY;  Surgeon: Jettie Booze, MD;  Location: [facility];  Service: Cardiovascular;  Laterality: N/A;  . TONSILLECTOMY      Current Outpatient Medications  Medication Sig Dispense Refill  . antiseptic oral rinse (BIOTENE) LIQD 15 mLs by Mouth Rinse route as needed for dry mouth. Mouth spray    . Calcium Carb-Cholecalciferol (CALCIUM 600+D3 PO) Take 1 capsule by mouth daily.    . furosemide (LASIX) 20 MG tablet Take  20 mg Lasix on Monday, Wednesday and Friday. 30 tablet 1  . hydroxychloroquine (PLAQUENIL) 200 MG tablet Take 200 mg by mouth daily.     Marland Kitchen levothyroxine (SYNTHROID, LEVOTHROID) 50 MCG tablet Take 50  mcg by mouth daily before breakfast.     . lovastatin (MEVACOR) 20 MG tablet TAKE 1 TABLET BY MOUTH EVERY DAY (Patient taking differently: Take 20 mg by mouth daily. ) 90 tablet 1  . Magnesium Gluconate 250 MG TABS Take 250 mg by mouth daily.     Marland Kitchen olopatadine (PATADAY) 0.1 % ophthalmic solution Place 1 drop into both eyes 2 (two) times daily.    . potassium gluconate 595 (99 K)  MG TABS tablet Take 595 mg by mouth daily.     . predniSONE (DELTASONE) 5 MG tablet Take 5 mg by mouth daily.     . traMADol (ULTRAM) 50 MG tablet Take 50 mg by mouth at bedtime.     . Vaginal Lubricant (REPLENS) GEL Place 1 application vaginally See admin instructions. Every 3 days     No current facility-administered medications for this visit.    Allergies  Allergen Reactions  . Nsaids     unkn  . Oxybutynin Other (See Comments)    dizziness dizziness   . Requip [Ropinirole Hcl]   . Ropinirole Other (See Comments)    Other reaction(s): Hypotension (ALLERGY/intolerance) Other reaction(s): Hypotension (ALLERGY/intolerance)     Social History   Socioeconomic History  . Marital status: Divorced    Spouse name: Not on file  . Number of children: 1  . Years of education: 34  . Highest education level: Not on file  Occupational History  . Occupation: Real Estate Agent-Retired  Tobacco Use  . Smoking status: Never Smoker  . Smokeless tobacco: Never Used  Vaping Use  . Vaping Use: Never used  Substance and Sexual Activity  . Alcohol use: No  . Drug use: No  . Sexual activity: Not on file  Other Topics Concern  . Not on file  Social History Narrative   Lives alone in a 2 story home.  Has one child.  Retired.  Education: some college.    Social Determinants of Health   Financial Resource Strain:   . Difficulty of Paying Living Expenses:   Food Insecurity:   . Worried About Charity fundraiser in the Last Year:   . Arboriculturist in the Last Year:   Transportation Needs:   . Film/video editor (Medical):   Marland Kitchen Lack of Transportation (Non-Medical):   Physical Activity:   . Days of Exercise per Week:   . Minutes of Exercise per Session:   Stress:   . Feeling of Stress :   Social Connections:   . Frequency of Communication with Friends and Family:   . Frequency of Social Gatherings with Friends and Family:   . Attends Religious Services:   . Active Member of  Clubs or Organizations:   . Attends Archivist Meetings:   Marland Kitchen Marital Status:   Intimate Partner Violence:   . Fear of Current or Ex-Partner:   . Emotionally Abused:   Marland Kitchen Physically Abused:   . Sexually Abused:     Family History  Problem Relation Age of Onset  . Other Mother        Died at 63  . Heart failure Brother        Died at 34  . CAD Neg Hx        Negative family history  . Heart attack Neg Hx   . Heart disease Neg Hx     Review of Systems:  As stated in the HPI and otherwise negative.   BP 120/60  Pulse 73   Ht 5\' 7"  (1.702 m)   Wt 106 lb (48.1 kg)   SpO2 97%   BMI 16.60 kg/m   Physical Examination: General: Well developed, well nourished, NAD  HEENT: OP clear, mucus membranes moist  SKIN: warm, dry. No rashes. Neuro: No focal deficits  Musculoskeletal: Muscle strength 5/5 all ext  Psychiatric: Mood and affect normal  Neck: No JVD, no carotid bruits, no thyromegaly, no lymphadenopathy.  Lungs:Clear bilaterally, no wheezes, rhonci, crackles Cardiovascular: Regular rate and rhythm. Loud, harsh, late peaking systolic murmur.  Abdomen:Soft. Bowel sounds present. Non-tender.  Extremities: No lower extremity edema. Pulses are 2 + in the bilateral DP/PT.  Echo 09/08/19:   1. Left ventricular ejection fraction, by estimation, is 55 to 60%. The  left ventricle has normal function. The left ventricle has no regional  wall motion abnormalities. There is mild concentric left ventricular  hypertrophy. Left ventricular diastolic  parameters are consistent with Grade II diastolic dysfunction  (pseudonormalization).  2. Right ventricular systolic function is normal. The right ventricular  size is normal. There is normal pulmonary artery systolic pressure.  3. The mitral valve is normal in structure. Trivial mitral valve  regurgitation. No evidence of mitral stenosis.  4. The aortic valve is thickened and heavily calcified. Aortic valve  regurgitation  is mild. Critical Aortic stenosis. Aortic valve area, by VTI  measures 0.60 cm. Aortic valve mean gradient measures 61.0 mmHg. Aortic  valve Vmax measures 4.70 m/s.  5. The inferior vena cava is normal in size with greater than 50%  respiratory variability, suggesting right atrial pressure of 3 mmHg.   Comparison(s): In comparison to study done in 10/14/2018 aortic stenosis is  now critical aortic stenosis.   FINDINGS  Left Ventricle: Left ventricular ejection fraction, by estimation, is 55  to 60%. The left ventricle has normal function. The left ventricle has no  regional wall motion abnormalities. The left ventricular internal cavity  size was normal in size. There is  mild concentric left ventricular hypertrophy. Left ventricular diastolic  parameters are consistent with Grade II diastolic dysfunction  (pseudonormalization).   Right Ventricle: The right ventricular size is normal. No increase in  right ventricular wall thickness. Right ventricular systolic function is  normal. There is normal pulmonary artery systolic pressure. The tricuspid  regurgitant velocity is 2.62 m/s, and  with an assumed right atrial pressure of 8 mmHg, the estimated right  ventricular systolic pressure is 62.6 mmHg.   Left Atrium: Left atrial size was normal in size.   Right Atrium: Right atrial size was normal in size.   Pericardium: A small pericardial effusion is present. The pericardial  effusion is anterior to the right ventricle. There is no evidence of  cardiac tamponade.   Mitral Valve: The mitral valve is normal in structure. Normal mobility of  the mitral valve leaflets. Trivial mitral valve regurgitation. No evidence  of mitral valve stenosis.   Tricuspid Valve: The tricuspid valve is normal in structure. Tricuspid  valve regurgitation is not demonstrated. No evidence of tricuspid  stenosis.   Aortic Valve: The aortic valve is abnormal. . There is moderate thickening  and  moderate calcification of the aortic valve. Aortic valve regurgitation  is mild. Critical Aortic stenosis. Mild aortic valve annular  calcification. There is moderate thickening  of the aortic valve. There is moderate calcification of the aortic valve.  Aortic valve mean gradient measures 61.0 mmHg. Aortic valve peak gradient  measures 88.4 mmHg. Aortic valve area, by  VTI measures 0.60 cm.   Pulmonic Valve: The pulmonic valve was normal in structure. Pulmonic valve  regurgitation is not visualized. No evidence of pulmonic stenosis.   Aorta: The aortic root is normal in size and structure.   Venous: The inferior vena cava is normal in size with greater than 50%  respiratory variability, suggesting right atrial pressure of 3 mmHg.   IAS/Shunts: No atrial level shunt detected by color flow Doppler.     LEFT VENTRICLE  PLAX 2D  LVIDd:     4.00 cm Diastology  LVIDs:     2.75 cm LV e' lateral:  4.24 cm/s  LV PW:     1.20 cm LV E/e' lateral: 17.4  LV IVS:    1.10 cm LV e' medial:  3.92 cm/s  LVOT diam:   2.00 cm LV E/e' medial: 18.8  LV SV:     69  LV SV Index:  44  LVOT Area:   3.14 cm     RIGHT VENTRICLE       IVC  RV S prime:   11.55 cm/s IVC diam: 2.00 cm  TAPSE (M-mode): 3.2 cm   LEFT ATRIUM       Index    RIGHT ATRIUM      Index  LA diam:    2.90 cm 1.84 cm/m RA Area:   13.30 cm  LA Vol (A2C):  40.4 ml 25.59 ml/m RA Volume:  35.10 ml 22.23 ml/m  LA Vol (A4C):  42.6 ml 26.98 ml/m  LA Biplane Vol: 45.2 ml 28.62 ml/m  AORTIC VALVE  AV Area (Vmax):  0.58 cm  AV Area (Vmean):  0.54 cm  AV Area (VTI):   0.60 cm  AV Vmax:      470.00 cm/s  AV Vmean:     370.000 cm/s  AV VTI:      1.160 m  AV Peak Grad:   88.4 mmHg  AV Mean Grad:   61.0 mmHg  LVOT Vmax:     86.50 cm/s  LVOT Vmean:    63.800 cm/s  LVOT VTI:     0.220 m  LVOT/AV VTI ratio: 0.19    AORTA   Ao Root diam: 2.80 cm  Ao Asc diam: 3.20 cm   MV E velocity: 73.70 cm/s TRICUSPID VALVE  MV A velocity: 78.10 cm/s TR Peak grad:  27.5 mmHg  MV E/A ratio: 0.94    TR Vmax:    262.00 cm/s                 SHUNTS               Systemic VTI: 0.22 m               Systemic Diam: 2.00 cm   Cardia cath 10/12/19: Left Anterior Descending  Ost LAD to Prox LAD lesion is 25% stenosed.  Mid LAD lesion is 30% stenosed.  First Diagonal Branch  1st Diag lesion is 25% stenosed.  Left Circumflex  There is mild diffuse disease throughout the vessel.  Right Coronary Artery  Prox RCA lesion is 25% stenosed.  Intervention  No interventions have been documented. Right Heart  Right Heart Pressures Ao 100%, PA sat 80%, PA 19/4, mean PA pressure 9 mm Hg, mean PCWP 5 mm Hg; CO 5.9 L/min; CI 3.97  Left Heart  Aortic Valve There is severe aortic valve stenosis. Known severe AS  Coronary Diagrams  Diagnostic Dominance: Right  Intervention  Implants   No  implant documentation for this case.  Syngo Images  Show images for CARDIAC CATHETERIZATION Images on Long Term Storage  Show images for [place], [place] to Procedure Log  Procedure Log    Hemo Data   Most Recent Value  Fick Cardiac Output 5.97 L/min  Fick Cardiac Output Index 3.97 (L/min)/BSA  RA A Wave 3 mmHg  RA V Wave 1 mmHg  RA Mean 0 mmHg  RV Systolic Pressure 24 mmHg  RV Diastolic Pressure 0 mmHg  RV EDP 3 mmHg  PA Systolic Pressure 19 mmHg  PA Diastolic Pressure 4 mmHg  PA Mean 9 mmHg  PW A Wave 9 mmHg  PW V Wave 9 mmHg  PW Mean 5 mmHg  AO Systolic Pressure 825 mmHg  AO Diastolic Pressure 48 mmHg  AO Mean 74 mmHg  QP/QS 1  TPVR Index 2.26 HRUI  TSVR Index 18.61 HRUI  TPVR/TSVR Ratio 0.12     Recent Labs: 08/16/2019: ALT 14; NT-Pro BNP 481; TSH 2.340 10/10/2019: BUN 19; Creatinine, Ser 1.03 10/11/2019: Platelets 112 10/12/2019: Hemoglobin 10.5;  Potassium 3.7; Sodium 142   Lipid Panel    Component Value Date/Time   CHOL 170 08/16/2019 1357   TRIG 71 08/16/2019 1357   HDL 89 08/16/2019 1357   CHOLHDL 1.9 08/16/2019 1357   LDLCALC 68 08/16/2019 1357     Wt Readings from Last 3 Encounters:  10/19/19 106 lb (48.1 kg)  10/12/19 100 lb (45.4 kg)  09/12/19 109 lb (49.4 kg)     Other studies Reviewed: Additional studies/ records that were reviewed today include: cath images, echo images, office notes Review of the above records demonstrates: critical AS   Assessment and Plan:   1. Severe Aortic Valve Stenosis: She has  Severe (critical), stage D aortic valve stenosis. I have personally reviewed the echo images. The aortic valve is thickened, calcified with limited leaflet mobility. I think she would benefit from AVR. Given advanced age, she is not a good candidate for conventional AVR by surgical approach. I think she may be a good candidate for TAVR.   STS Risk Score:  Procedure: Isolated AVR   Risk of Mortality: 5.305%  Renal Failure: 2.886%  Permanent Stroke: 2.546%  Prolonged Ventilation: 10.588%  DSW Infection: 0.039%  Reoperation: 5.552%  Morbidity or Mortality: 17.396%  Short Length of Stay: 20.785%  Long Length of Stay: 9.612%    I have reviewed the natural history of aortic stenosis with the patient and their family members  who are present today. We have discussed the limitations of medical therapy and the poor prognosis associated with symptomatic aortic stenosis. We have reviewed potential treatment options, including palliative medical therapy, conventional surgical aortic valve replacement, and transcatheter aortic valve replacement. We discussed treatment options in the context of the patient's specific comorbid medical conditions.   She would like to proceed with planning for TAVR.  Risks and benefits of the valve procedure are reviewed with the patient. She has completed her CT scans and carotid artery  dopplers today. Results are pending. She will see Dr. Roxy Manns tomorrow. Potential TAVR next week on 10/25/19.       Current medicines are reviewed at length with the patient today.  The patient does not have concerns regarding medicines.  The following changes have been made:  no change  Labs/ tests ordered today include:  No orders of the defined types were placed in this encounter.    Disposition:   FU with the valve team.  Signed, Lauree Chandler, MD 10/19/2019 3:35 PM    [facility] [address], [place], [place]  [address] Phone: [phone]; Fax: [phone]

## 2019-10-20 ENCOUNTER — Institutional Professional Consult (permissible substitution) (INDEPENDENT_AMBULATORY_CARE_PROVIDER_SITE_OTHER): Payer: Medicare HMO | Admitting: Thoracic Surgery (Cardiothoracic Vascular Surgery)

## 2019-10-20 ENCOUNTER — Telehealth: Payer: Medicare HMO | Admitting: Cardiology

## 2019-10-20 ENCOUNTER — Encounter: Payer: Self-pay | Admitting: Thoracic Surgery (Cardiothoracic Vascular Surgery)

## 2019-10-20 VITALS — BP 121/64 | HR 70 | Temp 99.1°F | Resp 16 | Ht 67.0 in | Wt 106.0 lb

## 2019-10-20 DIAGNOSIS — I350 Nonrheumatic aortic (valve) stenosis: Secondary | ICD-10-CM

## 2019-10-20 NOTE — Progress Notes (Signed)
[facility]  MULTIDISCIPLINARY HEART VALVE CLINIC  CARDIOTHORACIC SURGERY CONSULTATION REPORT  Referring Provider is Carmen Blackwell, Carmen Cork, MD PCP is Carmen Blackwell, Carmen Graff, MD  Chief Complaint  Patient presents with  . Aortic Stenosis    TAVR EVAL...review all required studies and procedures    HPI:  Patient is an 86 year old female with history of aortic stenosis, hypertension, hyperlipidemia, Sjogren's syndrome on chronic prednisone and Plaquenil, and remote history of MALT lymphoma in remission who has been referred for surgical consultation to discuss treatment options for management of severe symptomatic aortic stenosis.  Patient states that she was first noted to have a heart murmur on physical exam 4-5 years ago.  Echocardiogram revealed findings consistent with moderate aortic stenosis with preserved left ventricular systolic function and she has been followed ever since by Dr. Bettina Blackwell.  Recent follow-up echocardiogram revealed significant progression of disease with peak velocity across aortic valve measured 4.7 m/s corresponding to mean transvalvular gradient estimated 61 mmHg and aortic valve area calculated only 0.60 cm with DVI reported only 0.19.  Left ventricular systolic function remains preserved with ejection fraction estimated 55 to 60%.  The patient was referred to the multidisciplinary heart valve clinic and has been evaluated previously by Dr. Angelena Blackwell.  Diagnostic cardiac catheterization performed October 12, 2019 revealed mild nonobstructive coronary artery disease.  The aortic valve was not crossed to measure gradient.  Right-sided pressures were normal.  CT angiography was performed and the patient was referred for surgical consultation.  Patient is single and [place] near [place], [place].  She has 1 adult daughter who lives in [place] but is currently present for her office consultation visit and plans to stay with the patient for short  period of time following hospital discharge.  The patient has remained functionally independent.  She still drives an automobile.  However, her mobility is fairly limited.  She has very poor balance and generalized weakness as well as some chronic back pain, all related to a long history of degenerative disc disease.  She walks using a cane or a rolling walker.  She admits that she is unsteady on her feet although she has not had any mechanical falls.  She remains fairly sedentary but also independent.  She admits to decreased energy and some degree of exertional shortness of breath that occurs only with more strenuous exertion.  She denies any history of resting shortness of breath, PND, orthopnea, or chest pain.  She has had frequent dizzy spells that have increased recently but she denies any history of syncope.  She has had some lower extremity edema as well as sensation of both feet feeling cold all of the time.  She has been on chronic prednisone and Plaquenil for Sjogren's syndrome for many years.  Past Medical History:  Diagnosis Date  . Acquired hypothyroidism 04/09/2015  . Aortic stenosis   . Atrophic vaginitis 12/30/2017   Formatting of this note might be different from the original. 2019: ERT vaginally  . Bacterial food poisoning 07/20/2019   Formatting of this note might be different from the original. 2021  . Benign positional vertigo 04/12/2019   Formatting of this note might be different from the original. 2916, 2020  . Chronic kidney disease, stage III (moderate) 04/09/2015  . Chronic right-sided low back pain without sciatica 08/07/2017  . Cough 08/28/2015  . Essential hypertension 08/28/2015  . GERD (gastroesophageal reflux disease) 04/09/2015  . HLD (hyperlipidemia)   . Lumbosacral radiculopathy at L5  06/18/2018   Formatting of this note might be different from the original. 2020  . MALT lymphoma ([place]) 10/10/2015  . Osteopenia 06/02/2017  . Pancytopenia ([place]) 10/10/2015  . Primary  osteoarthritis involving multiple joints 05/07/2018  . Raynaud's disease without gangrene 07/20/2019   Formatting of this note might be different from the original. 2020: onset  . Risk for falls 07/14/2016  . Sciatica 06/02/2017  . Severe aortic stenosis   . Sjogren's syndrome ([facility])   . Throat burning 12/16/2017   Formatting of this note might be different from the original. 2019: chronic  . Urge incontinence 05/06/2016    Past Surgical History:  Procedure Laterality Date  . ABDOMINAL HYSTERECTOMY    . APPENDECTOMY    . LUMBAR DISC SURGERY    . RIGHT/LEFT HEART CATH AND CORONARY ANGIOGRAPHY N/A 10/12/2019   Procedure: RIGHT/LEFT HEART CATH AND CORONARY ANGIOGRAPHY;  Surgeon: Jettie Booze, MD;  Location: [facility];  Service: Cardiovascular;  Laterality: N/A;  . TONSILLECTOMY      Family History  Problem Relation Age of Onset  . Other Mother        Died at 28  . Heart failure Brother        Died at 74  . CAD Neg Hx        Negative family history  . Heart attack Neg Hx   . Heart disease Neg Hx     Social History   Socioeconomic History  . Marital status: Divorced    Spouse name: Not on file  . Number of children: 1  . Years of education: 6  . Highest education level: Not on file  Occupational History  . Occupation: Real Estate Agent-Retired  Tobacco Use  . Smoking status: Never Smoker  . Smokeless tobacco: Never Used  Vaping Use  . Vaping Use: Never used  Substance and Sexual Activity  . Alcohol use: No  . Drug use: No  . Sexual activity: Not on file  Other Topics Concern  . Not on file  Social History Narrative   Lives alone in a 2 story home.  Has one child.  Retired.  Education: some college.    Social Determinants of Health   Financial Resource Strain:   . Difficulty of Paying Living Expenses:   Food Insecurity:   . Worried About Charity fundraiser in the Last Year:   . Arboriculturist in the Last Year:   Transportation Needs:   . Lexicographer (Medical):   Marland Kitchen Lack of Transportation (Non-Medical):   Physical Activity:   . Days of Exercise per Week:   . Minutes of Exercise per Session:   Stress:   . Feeling of Stress :   Social Connections:   . Frequency of Communication with Friends and Family:   . Frequency of Social Gatherings with Friends and Family:   . Attends Religious Services:   . Active Member of Clubs or Organizations:   . Attends Archivist Meetings:   Marland Kitchen Marital Status:   Intimate Partner Violence:   . Fear of Current or Ex-Partner:   . Emotionally Abused:   Marland Kitchen Physically Abused:   . Sexually Abused:     Current Outpatient Medications  Medication Sig Dispense Refill  . antiseptic oral rinse (BIOTENE) LIQD 15 mLs by Mouth Rinse route as needed for dry mouth. Mouth spray    . Calcium Carb-Cholecalciferol (CALCIUM 600+D3 PO) Take 1 capsule by mouth daily.    Marland Kitchen  hydroxychloroquine (PLAQUENIL) 200 MG tablet Take 200 mg by mouth daily.     Marland Kitchen levothyroxine (SYNTHROID, LEVOTHROID) 50 MCG tablet Take 50 mcg by mouth daily before breakfast.     . lovastatin (MEVACOR) 20 MG tablet TAKE 1 TABLET BY MOUTH EVERY DAY (Patient taking differently: Take 20 mg by mouth daily. ) 90 tablet 1  . Magnesium Gluconate 250 MG TABS Take 250 mg by mouth daily.     Marland Kitchen olopatadine (PATADAY) 0.1 % ophthalmic solution Place 1 drop into both eyes 2 (two) times daily.    . potassium gluconate 595 (99 K) MG TABS tablet Take 595 mg by mouth daily.     . predniSONE (DELTASONE) 5 MG tablet Take 5 mg by mouth daily.     . traMADol (ULTRAM) 50 MG tablet Take 50 mg by mouth at bedtime.     . Vaginal Lubricant (REPLENS) GEL Place 1 application vaginally See admin instructions. Every 3 days    . furosemide (LASIX) 20 MG tablet Take  20 mg Lasix on Monday, Wednesday and Friday. 30 tablet 1   No current facility-administered medications for this visit.    Allergies  Allergen Reactions  . Nsaids     unkn  . Oxybutynin Other  (See Comments)    dizziness dizziness   . Requip [Ropinirole Hcl]   . Ropinirole Other (See Comments)    Other reaction(s): Hypotension (ALLERGY/intolerance) Other reaction(s): Hypotension (ALLERGY/intolerance)       Review of Systems:   General:  normal appetite, decreased energy, no weight gain, no weight loss, no fever  Cardiac:  no chest pain with exertion, no chest pain at rest, +SOB with exertion, no resting SOB, no PND, no orthopnea, no palpitations, no arrhythmia, no atrial fibrillation, + LE edema, + dizzy spells, no syncope  Respiratory:  no shortness of breath, no home oxygen, no productive cough, + dry cough, no bronchitis, no wheezing, no hemoptysis, no asthma, no pain with inspiration or cough, no sleep apnea, no CPAP at night  GI:   no difficulty swallowing, no reflux, no frequent heartburn, no hiatal hernia, no abdominal pain, no constipation, no diarrhea, no hematochezia, no hematemesis, no melena  GU:   + dysuria,  + frequency, no urinary tract infection, no hematuria, no kidney stones, no kidney disease  Vascular:  + pain suggestive of claudication, + pain in feet, no leg cramps, + varicose veins, no DVT, no non-healing foot ulcer  Neuro:   no stroke, no TIA's, no seizures, no headaches, no temporary blindness one eye,  no slurred speech, possible peripheral neuropathy, + chronic pain, + instability of gait, no memory/cognitive dysfunction  Musculoskeletal: + arthritis, no joint swelling, no myalgias, + difficulty walking, limited mobility   Skin:   no rash, no itching, no skin infections, no pressure sores or ulcerations  Psych:   no anxiety, no depression, no nervousness, no unusual recent stress  Eyes:   no blurry vision, no floaters, no recent vision changes, + wears glasses or contacts  ENT:   no hearing loss, no loose or painful teeth, edentulous with full dentures  Hematologic:  + easy bruising, no abnormal bleeding, no clotting disorder, no frequent  epistaxis  Endocrine:  no diabetes, does not check CBG's at home           Physical Exam:   BP 121/64 (BP Location: Right Arm, Patient Position: Sitting, Cuff Size: Small)   Pulse 70   Temp 99.1 F (37.3 C)   Resp 16  Ht 5\' 7"  (1.702 m)   Wt 106 lb (48.1 kg)   SpO2 100% Comment: RA  BMI 16.60 kg/m   General:  Thin, elderly and frail-appearing  HEENT:  Unremarkable   Neck:   no JVD, no bruits, no adenopathy   Chest:   clear to auscultation, symmetrical breath sounds, no wheezes, no rhonchi   CV:   RRR, grade III/VI crescendo/decrescendo murmur heard best at RUSB,  no diastolic murmur  Abdomen:  soft, non-tender, no masses   Extremities:  warm, well-perfused, pulses diminished, no LE edema  Rectal/GU  Deferred  Neuro:   Grossly non-focal and symmetrical throughout  Skin:   Thin and frail but no areas of breakdown   Diagnostic Tests:  ECHOCARDIOGRAM REPORT     Patient Name: Carmen Blackwell Date of Exam: 09/08/2019  Medical Rec #: [id number] Height: 68.0 in  Accession #: [id number] Weight: 109.0 lb  Date of Birth: [birth year] BSA: 1.579 m  Patient Age: 75 years BP: 122/60 mmHg  Patient Gender: F HR: 69 bpm.  Exam Location: [facility]   Procedure: 2D Echo   Indications: Aortic stenosis, moderate [I35.0 (ICD-10-CM)]   History: Patient has prior history of Echocardiogram examinations,  most  recent 10/04/2018. Signs/Symptoms:Dyspnea; Risk  Factors:Hypertension, Diabetes and Dyslipidemia.   Sonographer: Luane School  Referring Phys: [id number] [place]    1. Left ventricular ejection fraction, by estimation, is 55 to 60%. The  left ventricle has normal function. The left ventricle has no regional  wall motion abnormalities. There is mild concentric left ventricular  hypertrophy. Left ventricular diastolic  parameters are consistent with Grade II diastolic dysfunction  (pseudonormalization).  2. Right ventricular systolic function is normal. The right  ventricular  size is normal. There is normal pulmonary artery systolic pressure.  3. The mitral valve is normal in structure. Trivial mitral valve  regurgitation. No evidence of mitral stenosis.  4. The aortic valve is thickened and heavily calcified. Aortic valve  regurgitation is mild. Critical Aortic stenosis. Aortic valve area, by VTI  measures 0.60 cm. Aortic valve mean gradient measures 61.0 mmHg. Aortic  valve Vmax measures 4.70 m/s.  5. The inferior vena cava is normal in size with greater than 50%  respiratory variability, suggesting right atrial pressure of 3 mmHg.   Comparison(s): In comparison to study done in 10/14/2018 aortic stenosis is  now critical aortic stenosis.   FINDINGS  Left Ventricle: Left ventricular ejection fraction, by estimation, is 55  to 60%. The left ventricle has normal function. The left ventricle has no  regional wall motion abnormalities. The left ventricular internal cavity  size was normal in size. There is  mild concentric left ventricular hypertrophy. Left ventricular diastolic  parameters are consistent with Grade II diastolic dysfunction  (pseudonormalization).   Right Ventricle: The right ventricular size is normal. No increase in  right ventricular wall thickness. Right ventricular systolic function is  normal. There is normal pulmonary artery systolic pressure. The tricuspid  regurgitant velocity is 2.62 m/s, and  with an assumed right atrial pressure of 8 mmHg, the estimated right  ventricular systolic pressure is 36.6 mmHg.   Left Atrium: Left atrial size was normal in size.   Right Atrium: Right atrial size was normal in size.   Pericardium: A small pericardial effusion is present. The pericardial  effusion is anterior to the right ventricle. There is no evidence of  cardiac tamponade.   Mitral Valve: The mitral valve is normal in structure. Normal mobility  of  the mitral valve leaflets. Trivial mitral valve regurgitation. No  evidence  of mitral valve stenosis.   Tricuspid Valve: The tricuspid valve is normal in structure. Tricuspid  valve regurgitation is not demonstrated. No evidence of tricuspid  stenosis.   Aortic Valve: The aortic valve is abnormal. . There is moderate thickening  and moderate calcification of the aortic valve. Aortic valve regurgitation  is mild. Critical Aortic stenosis. Mild aortic valve annular  calcification. There is moderate thickening  of the aortic valve. There is moderate calcification of the aortic valve.  Aortic valve mean gradient measures 61.0 mmHg. Aortic valve peak gradient  measures 88.4 mmHg. Aortic valve area, by VTI measures 0.60 cm.   Pulmonic Valve: The pulmonic valve was normal in structure. Pulmonic valve  regurgitation is not visualized. No evidence of pulmonic stenosis.   Aorta: The aortic root is normal in size and structure.   Venous: The inferior vena cava is normal in size with greater than 50%  respiratory variability, suggesting right atrial pressure of 3 mmHg.   IAS/Shunts: No atrial level shunt detected by color flow Doppler.    LEFT VENTRICLE  PLAX 2D  LVIDd: 4.00 cm Diastology  LVIDs: 2.75 cm LV e' lateral: 4.24 cm/s  LV PW: 1.20 cm LV E/e' lateral: 17.4  LV IVS: 1.10 cm LV e' medial: 3.92 cm/s  LVOT diam: 2.00 cm LV E/e' medial: 18.8  LV SV: 69  LV SV Index: 44  LVOT Area: 3.14 cm    RIGHT VENTRICLE IVC  RV S prime: 11.55 cm/s IVC diam: 2.00 cm  TAPSE (M-mode): 3.2 cm   LEFT ATRIUM Index RIGHT ATRIUM Index  LA diam: 2.90 cm 1.84 cm/m RA Area: 13.30 cm  LA Vol (A2C): 40.4 ml 25.59 ml/m RA Volume: 35.10 ml 22.23 ml/m  LA Vol (A4C): 42.6 ml 26.98 ml/m  LA Biplane Vol: 45.2 ml 28.62 ml/m  AORTIC VALVE  AV Area (Vmax): 0.58 cm  AV Area (Vmean): 0.54 cm  AV Area (VTI): 0.60 cm  AV Vmax: 470.00 cm/s  AV Vmean: 370.000 cm/s  AV VTI: 1.160 m  AV Peak Grad: 88.4 mmHg  AV Mean Grad: 61.0 mmHg  LVOT Vmax: 86.50 cm/s  LVOT  Vmean: 63.800 cm/s  LVOT VTI: 0.220 m  LVOT/AV VTI ratio: 0.19   AORTA  Ao Root diam: 2.80 cm  Ao Asc diam: 3.20 cm   MV E velocity: 73.70 cm/s TRICUSPID VALVE  MV A velocity: 78.10 cm/s TR Peak grad: 27.5 mmHg  MV E/A ratio: 0.94 TR Vmax: 262.00 cm/s   SHUNTS  Systemic VTI: 0.22 m  Systemic Diam: 2.00 cm   Kardie Tobb DO  Electronically signed by Berniece Salines DO  Signature Date/Time: 09/08/2019/12:30:25 PM     RIGHT/LEFT HEART CATH AND CORONARY ANGIOGRAPHY  Conclusion    Prox RCA lesion is 25% stenosed.  Ost LAD to Prox LAD lesion is 25% stenosed.  1st Diag lesion is 25% stenosed.  Mid LAD lesion is 30% stenosed.  There is severe aortic valve stenosis, based on prior echo.  Ao 100%, PA sat 80%, PA 19/4, mean PA pressure 9 mm Hg, mean PCWP 5 mm Hg; CO 5.9 L/min; CI 3.97   Nonobstructive CAD.  Normal right heart pressures.  Valve not crossed.   Continue plans for TAVR workup.   Recommendations  Discharge Date In the absence of any other complications or medical issues, we expect the patient to be ready for discharge on 10/12/2019.  Indications  Nonrheumatic  aortic valve stenosis [I35.0 (ICD-10-CM)]  Procedural Details  Technical Details The risks, benefits, and details of the procedure were explained to the patient.  The patient verbalized understanding and wanted to proceed.  Informed written consent was obtained.  PROCEDURE TECHNIQUE:  After Xylocaine anesthesia, a 5 French sheath was placed in the right antecubital area. A 5 French balloontipped Swan-Ganz catheter was advanced to the pulmonary artery under fluoroscopic guidance. Hemodynamic pressures were obtained. Oxygen saturations were obtained. After Xylocaine anesthesia, a 28F sheath was placed in the right radial artery with a single anterior needle wall stick with U/s guidance.   Left coronary angiography was done using a Judkins L3.5 guide catheter.  Right coronary angiography was done using a Judkins R4  guide catheter. Left heart cath was not done, unable to cross the valve.  Attempts made with straight wire and JR4 and AL1.      Contrast: 50 cc  Estimated blood loss <50 mL.   During this procedure medications were administered to achieve and maintain moderate conscious sedation while the patient's heart rate, blood pressure, and oxygen saturation were continuously monitored and I was present face-to-face 100% of this time.  Medications (Filter: Administrations occurring from 1103 to 1208 on 10/12/19) (important) Continuous medications are totaled by the amount administered until 10/12/19 1208.  fentaNYL (SUBLIMAZE) injection (mcg) Total dose:  25 mcg Date/Time  Rate/Dose/Volume Action  10/12/19 1125  25 mcg Given    midazolam (VERSED) injection (mg) Total dose:  1 mg Date/Time  Rate/Dose/Volume Action  10/12/19 1125  1 mg Given    Heparin (Porcine) in NaCl 1000-0.9 UT/500ML-% SOLN (mL) Total volume:  1,000 mL Date/Time  Rate/Dose/Volume Action  10/12/19 1127  500 mL Given  1127  500 mL Given    lidocaine (PF) (XYLOCAINE) 1 % injection (mL) Total volume:  5 mL Date/Time  Rate/Dose/Volume Action  10/12/19 1128  5 mL Given    Radial Cocktail/Verapamil only (mL) Total volume:  10 mL Date/Time  Rate/Dose/Volume Action  10/12/19 1128  10 mL Given    heparin sodium (porcine) injection (Units) Total dose:  2,000 Units Date/Time  Rate/Dose/Volume Action  10/12/19 1203  2,000 Units Given    iohexol (OMNIPAQUE) 350 MG/ML injection (mL) Total volume:  50 mL Date/Time  Rate/Dose/Volume Action  10/12/19 1203  50 mL Given    Sedation Time  Sedation Time Physician-1: 34 minutes 59 seconds  Contrast  Medication Name Total Dose  iohexol (OMNIPAQUE) 350 MG/ML injection 50 mL    Radiation/Fluoro  Fluoro time: 12.3 (min) DAP: 5081 (mGycm2) Cumulative Air Kerma: 87 (mGy)  Complications  Complications documented before study signed (10/12/2019 82:99 PM)   No complications  were associated with this study.  Documented by Jettie Booze, MD - 10/12/2019 12:10 PM    Coronary Findings  Diagnostic Dominance: Right Left Anterior Descending  Ost LAD to Prox LAD lesion is 25% stenosed.  Mid LAD lesion is 30% stenosed.  First Diagonal Branch  1st Diag lesion is 25% stenosed.  Left Circumflex  There is mild diffuse disease throughout the vessel.  Right Coronary Artery  Prox RCA lesion is 25% stenosed.  Intervention  No interventions have been documented. Right Heart  Right Heart Pressures Ao 100%, PA sat 80%, PA 19/4, mean PA pressure 9 mm Hg, mean PCWP 5 mm Hg; CO 5.9 L/min; CI 3.97  Left Heart  Aortic Valve There is severe aortic valve stenosis. Known severe AS  Coronary Diagrams  Diagnostic Dominance: Right  Intervention  Implants   No implant documentation for this case.  Syngo Images  Show images for CARDIAC CATHETERIZATION Images on Long Term Storage  Show images for Harlym, Gehling to Procedure Log  Procedure Log    Hemo Data   Most Recent Value  Fick Cardiac Output 5.97 L/min  Fick Cardiac Output Index 3.97 (L/min)/BSA  RA A Wave 3 mmHg  RA V Wave 1 mmHg  RA Mean 0 mmHg  RV Systolic Pressure 24 mmHg  RV Diastolic Pressure 0 mmHg  RV EDP 3 mmHg  PA Systolic Pressure 19 mmHg  PA Diastolic Pressure 4 mmHg  PA Mean 9 mmHg  PW A Wave 9 mmHg  PW V Wave 9 mmHg  PW Mean 5 mmHg  AO Systolic Pressure 332 mmHg  AO Diastolic Pressure 48 mmHg  AO Mean 74 mmHg  QP/QS 1  TPVR Index 2.26 HRUI  TSVR Index 18.61 HRUI  TPVR/TSVR Ratio 0.12    Cardiac TAVR CT  TECHNIQUE: The patient was scanned on a Siemens Force 951 slice scanner. A 120 kV retrospective scan was triggered in the descending thoracic aorta at 111 HU's. Gantry rotation speed was 270 msecs and collimation was .9 mm. No beta blockade or nitro were given. The 3D data set was reconstructed in 5% intervals of the R-R cycle. Systolic and diastolic phases  were analyzed on a dedicated work station using MPR, MIP and VRT modes. The patient received 80 cc of contrast.  FINDINGS: Aortic Valve: Tri cuspid calcified with restricted leaflet motion  Aorta: Moderate mixed calcific plaque with normal arch vessels  Sinotubular Junction: 24 mm with calcification  Ascending Thoracic Aorta: 29 mm  Aortic Arch: 29 mm  Descending Thoracic Aorta: 20 mm  Sinus of Valsalva Measurements:  Non-coronary: 28.1 mm  Right - coronary: 25.6 mm  Left - coronary: 28.6 mm  Coronary Artery Height above Annulus:  Left Main: 11 mm above annulus  Right Coronary: 14.9 mm above annulus  Virtual Basal Annulus Measurements:  Maximum/Minimum Diameter: 25.9 mm x 21.9 mm  Perimeter: 76 mm  Area: 445 mm 2  Coronary Arteries: Sufficient height above annulus for deployment  Optimum Fluoroscopic Angle for Delivery: LAO 5 Caudal 8 degrees  IMPRESSION: 1. Tri leaflet AV with annular area of 445 mm 2 suitable for a 26 mm Sapien 3 valve  2.  Coronary arteries suitable height above annulus for deployment  3.  Optimum angiographic angle for deployment LAO 5 Caudal 8 degrees  4.  Normal aortic root 2.9 cm  Jenkins Rouge   Electronically Signed   By: Jenkins Rouge M.D.   On: 10/19/2019 15:06    CT ANGIOGRAPHY CHEST, ABDOMEN AND PELVIS  TECHNIQUE: Non-contrast CT of the chest was initially obtained.  Multidetector CT imaging through the chest, abdomen and pelvis was performed using the standard protocol during bolus administration of intravenous contrast. Multiplanar reconstructed images and MIPs were obtained and reviewed to evaluate the vascular anatomy.  CONTRAST:  70mL OMNIPAQUE IOHEXOL 350 MG/ML SOLN  COMPARISON:  CT the abdomen and pelvis 08/25/2015.  FINDINGS: CTA CHEST FINDINGS  Cardiovascular: Heart size is normal. There is no significant pericardial fluid, thickening or pericardial calcification. There  is aortic atherosclerosis, as well as atherosclerosis of the great vessels of the mediastinum and the coronary arteries, including calcified atherosclerotic plaque in the left main, left anterior descending and left circumflex coronary arteries. Severe thickening calcification of the aortic valve.  Mediastinum/Lymph Nodes: No pathologically enlarged mediastinal or hilar lymph nodes. Esophagus  is unremarkable in appearance. No axillary lymphadenopathy.  Lungs/Pleura: Nodular scarring and architectural distortion in the anterior aspect of the right lower lobe abutting the major fissure, very similar to prior CT the abdomen and pelvis 08/25/2015. 4 mm subpleural nodule in the posterior aspect of the left upper lobe abutting the major fissure (axial image 29 of series 8). A few other scattered 1-2 mm pulmonary nodules are noted in the periphery of the lungs bilaterally, likely to reflect areas of mild chronic mucoid impaction within terminal bronchioles. No other larger more suspicious appearing pulmonary nodules or masses are noted. No acute consolidative airspace disease. No pleural effusions.  Musculoskeletal/Soft Tissues: There are no aggressive appearing lytic or blastic lesions noted in the visualized portions of the skeleton.  CTA ABDOMEN AND PELVIS FINDINGS  Hepatobiliary: No suspicious cystic or solid hepatic lesions. No intra or extrahepatic biliary ductal dilatation. Gallbladder is normal in appearance.  Pancreas: No pancreatic mass. No pancreatic ductal dilatation. No pancreatic or peripancreatic fluid collections or inflammatory changes.  Spleen: Unremarkable.  Adrenals/Urinary Tract: Bilateral adrenal glands in the right kidney are normal in appearance. Left kidney is severely atrophic. Left hydroureteronephrosis which terminates in the distal third of the left ureter, suggesting a chronic left ureteral stricture. Urinary bladder is normal in  appearance.  Stomach/Bowel: Normal appearance of the stomach. No pathologic dilatation of small bowel or colon. Numerous colonic diverticulae are noted, without surrounding inflammatory changes to suggest an acute diverticulitis at this time. The appendix is not confidently identified and may be surgically absent. Regardless, there are no inflammatory changes noted adjacent to the cecum to suggest the presence of an acute appendicitis at this time.  Vascular/Lymphatic: Aortic atherosclerosis with fusiform infrarenal abdominal aortic aneurysm measuring up to 3.1 x 3.3 cm in diameter ( axial image 131 of series 7). Vascular findings and measurements pertinent to potential TAVR procedure, as detailed below. No pathologically enlarged abdominal or pelvic lymph nodes.  Reproductive: Status post hysterectomy. Ovaries are not confidently identified may be surgically absent or atrophic  Other: No significant volume of ascites.  No pneumoperitoneum.  Musculoskeletal: There are no aggressive appearing lytic or blastic lesions noted in the visualized portions of the skeleton.  VASCULAR MEASUREMENTS PERTINENT TO TAVR:  AORTA:  Minimal Aortic Diameter-21 x 18 mm  Severity of Aortic Calcification-moderate  RIGHT PELVIS:  Right Common Iliac Artery -  Minimal Diameter-8.6 x 9.0 mm  Tortuosity-mild  Calcification-mild-to-moderate  Right External Iliac Artery -  Minimal Diameter-6.8 x 6.1 mm  Tortuosity - mild  Calcification-none  Right Common Femoral Artery -  Minimal Diameter-6.8 x 6.2 mm  Tortuosity - mild  Calcification-mild-to-moderate  LEFT PELVIS:  Left Common Iliac Artery -  Minimal Diameter-9.4 x 9.1 mm  Tortuosity - mild  Calcification-mild-to-moderate  Left External Iliac Artery -  Minimal Diameter-6.5 x 6.0 mm  Tortuosity - mild  Calcification-none  Left Common Femoral Artery -  Minimal Diameter-6.4 x 6.1  mm  Tortuosity - mild  Calcification-mild-to-moderate  Review of the MIP images confirms the above findings.  IMPRESSION: 1. Vascular findings and measurements pertinent to potential TAVR procedure, as detailed above. 2. Severe thickening calcification of the aortic valve, compatible with reported clinical history of severe aortic stenosis. 3. Small pulmonary nodules in the lungs measuring 4 mm or less in size, nonspecific, but statistically benign. No follow-up needed if patient is low-risk (and has no known or suspected primary neoplasm). Non-contrast chest CT can be considered in 12 months if patient is high-risk. This recommendation  follows the consensus statement: Guidelines for Management of Incidental Pulmonary Nodules Detected on CT Images: From the Fleischner Society 2017; Radiology 2017; 284:228-243. 4. Fusiform infrarenal abdominal aortic aneurysm measuring up to 3.1 x 3.3 cm in diameter. Recommend followup by ultrasound in 3 years. This recommendation follows ACR consensus guidelines: White Paper of the ACR Incidental Findings Committee II on Vascular Findings. J Am Coll Radiol 2013; 10:789-794. 5. Colonic diverticulosis without evidence of acute diverticulitis at this time. 6. Additional incidental findings, as above.   Electronically Signed   By: Vinnie Langton M.D.   On: 10/19/2019 14:40    EKG: NSR w/out significant AV conduction delay   Impression:  Patient has stage D1 severe symptomatic aortic stenosis.  She lives a fairly sedentary lifestyle and overall minimizes symptoms, although she does admit to decreased energy and exertional shortness of breath consistent with chronic diastolic congestive heart failure, [place] Heart Association functional class II.  She has also had some lower extremity edema and increasing dizzy spells without history of syncope.  I have personally reviewed the patient's recent transthoracic echocardiogram, diagnostic  cardiac catheterization, and CT angiograms.  Echocardiogram reveals severe aortic stenosis with preserved left ventricular systolic function.  The aortic valve is trileaflet with severe thickening, calcification, and restricted leaflet mobility involving all 3 leaflets of the aortic valve.  Peak velocity across aortic valve measured 4.7 m/s corresponding to mean transvalvular gradient estimated 60 mmHg and aortic valve area calculated only 0.6 cm.  Left ventricular ejection fraction was estimated 50 to 55%.  Diagnostic cardiac catheterization is notable for the absence of significant coronary artery disease and normal right-sided pressures.  I agree the patient would benefit from aortic valve replacement.  However, I would not consider this elderly patient with poor mobility a candidate for conventional surgical aortic valve replacement.  Options include transcatheter aortic valve replacement or long-term palliative medical care.  Cardiac-gated CTA of the heart reveals anatomical characteristics consistent with aortic stenosis suitable for treatment by transcatheter aortic valve replacement without any significant complicating features and CTA of the aorta and iliac vessels demonstrate what appears to be adequate pelvic vascular access to facilitate a transfemoral approach.  Baseline EKG reveals sinus rhythm without significant AV conduction delay.    Plan:  The patient and her daughter were counseled at length regarding treatment alternatives for management of severe symptomatic aortic stenosis. Alternative approaches such as conventional aortic valve replacement, transcatheter aortic valve replacement, and continued medical therapy without intervention were compared and contrasted at length.  The risks associated with conventional surgical aortic valve replacement were discussed in detail, as were expectations for post-operative convalescence, and why I would be reluctant to consider this patient a  candidate for conventional surgery.  Issues specific to transcatheter aortic valve replacement were discussed including questions about long term valve durability, the potential for paravalvular leak, possible increased risk of need for permanent pacemaker placement, and other technical complications related to the procedure itself.  Long-term prognosis with medical therapy was discussed. This discussion was placed in the context of the patient's own specific clinical presentation and past medical history.  All of their questions have been addressed.  The patient desires to proceed with transcatheter aortic valve replacement as soon as possible.  Following the decision to proceed with transcatheter aortic valve replacement, a discussion has been held regarding what types of management strategies would be attempted intraoperatively in the event of life-threatening complications, including whether or not the patient would be considered a candidate  for the use of cardiopulmonary bypass and/or conversion to open sternotomy for attempted surgical intervention.  The patient specifically requests that should a potentially life-threatening complication develop we would not attempt emergency median sternotomy and/or other aggressive surgical procedures.  The patient has been advised of a variety of complications that might develop including but not limited to risks of death, stroke, paravalvular leak, aortic dissection or other major vascular complications, aortic annulus rupture, device embolization, cardiac rupture or perforation, mitral regurgitation, acute myocardial infarction, arrhythmia, heart block or bradycardia requiring permanent pacemaker placement, congestive heart failure, respiratory failure, renal failure, pneumonia, infection, other late complications related to structural valve deterioration or migration, or other complications that might ultimately cause a temporary or permanent loss of functional  independence or other long term morbidity.  The patient provides full informed consent for the procedure as described and all questions were answered.    I spent in excess of 90 minutes during the conduct of this office consultation and >50% of this time involved direct face-to-face encounter with the patient for counseling and/or coordination of their care.      Valentina Gu. Roxy Manns, MD 10/20/2019 3:08 PM

## 2019-10-20 NOTE — H&P (View-Only) (Signed)
[facility]  MULTIDISCIPLINARY HEART VALVE CLINIC  CARDIOTHORACIC SURGERY CONSULTATION REPORT  Referring Provider is Bettina Gavia, Hilton Cork, MD PCP is Garlon Hatchet, Jaymes Graff, MD  Chief Complaint  Patient presents with  . Aortic Stenosis    TAVR EVAL...review all required studies and procedures    HPI:  Patient is an 86 year old female with history of aortic stenosis, hypertension, hyperlipidemia, Sjogren's syndrome on chronic prednisone and Plaquenil, and remote history of MALT lymphoma in remission who has been referred for surgical consultation to discuss treatment options for management of severe symptomatic aortic stenosis.  Patient states that she was first noted to have a heart murmur on physical exam 4-5 years ago.  Echocardiogram revealed findings consistent with moderate aortic stenosis with preserved left ventricular systolic function and she has been followed ever since by Dr. Bettina Gavia.  Recent follow-up echocardiogram revealed significant progression of disease with peak velocity across aortic valve measured 4.7 m/s corresponding to mean transvalvular gradient estimated 61 mmHg and aortic valve area calculated only 0.60 cm with DVI reported only 0.19.  Left ventricular systolic function remains preserved with ejection fraction estimated 55 to 60%.  The patient was referred to the multidisciplinary heart valve clinic and has been evaluated previously by Dr. Angelena Form.  Diagnostic cardiac catheterization performed October 12, 2019 revealed mild nonobstructive coronary artery disease.  The aortic valve was not crossed to measure gradient.  Right-sided pressures were normal.  CT angiography was performed and the patient was referred for surgical consultation.  Patient is single and [place] near [place], [place].  She has 1 adult daughter who lives in [place] but is currently present for her office consultation visit and plans to stay with the patient for short  period of time following hospital discharge.  The patient has remained functionally independent.  She still drives an automobile.  However, her mobility is fairly limited.  She has very poor balance and generalized weakness as well as some chronic back pain, all related to a long history of degenerative disc disease.  She walks using a cane or a rolling walker.  She admits that she is unsteady on her feet although she has not had any mechanical falls.  She remains fairly sedentary but also independent.  She admits to decreased energy and some degree of exertional shortness of breath that occurs only with more strenuous exertion.  She denies any history of resting shortness of breath, PND, orthopnea, or chest pain.  She has had frequent dizzy spells that have increased recently but she denies any history of syncope.  She has had some lower extremity edema as well as sensation of both feet feeling cold all of the time.  She has been on chronic prednisone and Plaquenil for Sjogren's syndrome for many years.  Past Medical History:  Diagnosis Date  . Acquired hypothyroidism 04/09/2015  . Aortic stenosis   . Atrophic vaginitis 12/30/2017   Formatting of this note might be different from the original. 2019: ERT vaginally  . Bacterial food poisoning 07/20/2019   Formatting of this note might be different from the original. 2021  . Benign positional vertigo 04/12/2019   Formatting of this note might be different from the original. 2916, 2020  . Chronic kidney disease, stage III (moderate) 04/09/2015  . Chronic right-sided low back pain without sciatica 08/07/2017  . Cough 08/28/2015  . Essential hypertension 08/28/2015  . GERD (gastroesophageal reflux disease) 04/09/2015  . HLD (hyperlipidemia)   . Lumbosacral radiculopathy at L5  06/18/2018   Formatting of this note might be different from the original. 2020  . MALT lymphoma ([place]) 10/10/2015  . Osteopenia 06/02/2017  . Pancytopenia ([place]) 10/10/2015  . Primary  osteoarthritis involving multiple joints 05/07/2018  . Raynaud's disease without gangrene 07/20/2019   Formatting of this note might be different from the original. 2020: onset  . Risk for falls 07/14/2016  . Sciatica 06/02/2017  . Severe aortic stenosis   . Sjogren's syndrome ([place])   . Throat burning 12/16/2017   Formatting of this note might be different from the original. 2019: chronic  . Urge incontinence 05/06/2016    Past Surgical History:  Procedure Laterality Date  . ABDOMINAL HYSTERECTOMY    . APPENDECTOMY    . LUMBAR DISC SURGERY    . RIGHT/LEFT HEART CATH AND CORONARY ANGIOGRAPHY N/A 10/12/2019   Procedure: RIGHT/LEFT HEART CATH AND CORONARY ANGIOGRAPHY;  Surgeon: Jettie Booze, MD;  Location: [facility];  Service: Cardiovascular;  Laterality: N/A;  . TONSILLECTOMY      Family History  Problem Relation Age of Onset  . Other Mother        Died at 43  . Heart failure Brother        Died at 3  . CAD Neg Hx        Negative family history  . Heart attack Neg Hx   . Heart disease Neg Hx     Social History   Socioeconomic History  . Marital status: Divorced    Spouse name: Not on file  . Number of children: 1  . Years of education: 62  . Highest education level: Not on file  Occupational History  . Occupation: Real Estate Agent-Retired  Tobacco Use  . Smoking status: Never Smoker  . Smokeless tobacco: Never Used  Vaping Use  . Vaping Use: Never used  Substance and Sexual Activity  . Alcohol use: No  . Drug use: No  . Sexual activity: Not on file  Other Topics Concern  . Not on file  Social History Narrative   Lives alone in a 2 story home.  Has one child.  Retired.  Education: some college.    Social Determinants of Health   Financial Resource Strain:   . Difficulty of Paying Living Expenses:   Food Insecurity:   . Worried About Charity fundraiser in the Last Year:   . Arboriculturist in the Last Year:   Transportation Needs:   . Lexicographer (Medical):   Marland Kitchen Lack of Transportation (Non-Medical):   Physical Activity:   . Days of Exercise per Week:   . Minutes of Exercise per Session:   Stress:   . Feeling of Stress :   Social Connections:   . Frequency of Communication with Friends and Family:   . Frequency of Social Gatherings with Friends and Family:   . Attends Religious Services:   . Active Member of Clubs or Organizations:   . Attends Archivist Meetings:   Marland Kitchen Marital Status:   Intimate Partner Violence:   . Fear of Current or Ex-Partner:   . Emotionally Abused:   Marland Kitchen Physically Abused:   . Sexually Abused:     Current Outpatient Medications  Medication Sig Dispense Refill  . antiseptic oral rinse (BIOTENE) LIQD 15 mLs by Mouth Rinse route as needed for dry mouth. Mouth spray    . Calcium Carb-Cholecalciferol (CALCIUM 600+D3 PO) Take 1 capsule by mouth daily.    Marland Kitchen  hydroxychloroquine (PLAQUENIL) 200 MG tablet Take 200 mg by mouth daily.     Marland Kitchen levothyroxine (SYNTHROID, LEVOTHROID) 50 MCG tablet Take 50 mcg by mouth daily before breakfast.     . lovastatin (MEVACOR) 20 MG tablet TAKE 1 TABLET BY MOUTH EVERY DAY (Patient taking differently: Take 20 mg by mouth daily. ) 90 tablet 1  . Magnesium Gluconate 250 MG TABS Take 250 mg by mouth daily.     Marland Kitchen olopatadine (PATADAY) 0.1 % ophthalmic solution Place 1 drop into both eyes 2 (two) times daily.    . potassium gluconate 595 (99 K) MG TABS tablet Take 595 mg by mouth daily.     . predniSONE (DELTASONE) 5 MG tablet Take 5 mg by mouth daily.     . traMADol (ULTRAM) 50 MG tablet Take 50 mg by mouth at bedtime.     . Vaginal Lubricant (REPLENS) GEL Place 1 application vaginally See admin instructions. Every 3 days    . furosemide (LASIX) 20 MG tablet Take  20 mg Lasix on Monday, Wednesday and Friday. 30 tablet 1   No current facility-administered medications for this visit.    Allergies  Allergen Reactions  . Nsaids     unkn  . Oxybutynin Other  (See Comments)    dizziness dizziness   . Requip [Ropinirole Hcl]   . Ropinirole Other (See Comments)    Other reaction(s): Hypotension (ALLERGY/intolerance) Other reaction(s): Hypotension (ALLERGY/intolerance)       Review of Systems:   General:  normal appetite, decreased energy, no weight gain, no weight loss, no fever  Cardiac:  no chest pain with exertion, no chest pain at rest, +SOB with exertion, no resting SOB, no PND, no orthopnea, no palpitations, no arrhythmia, no atrial fibrillation, + LE edema, + dizzy spells, no syncope  Respiratory:  no shortness of breath, no home oxygen, no productive cough, + dry cough, no bronchitis, no wheezing, no hemoptysis, no asthma, no pain with inspiration or cough, no sleep apnea, no CPAP at night  GI:   no difficulty swallowing, no reflux, no frequent heartburn, no hiatal hernia, no abdominal pain, no constipation, no diarrhea, no hematochezia, no hematemesis, no melena  GU:   + dysuria,  + frequency, no urinary tract infection, no hematuria, no kidney stones, no kidney disease  Vascular:  + pain suggestive of claudication, + pain in feet, no leg cramps, + varicose veins, no DVT, no non-healing foot ulcer  Neuro:   no stroke, no TIA's, no seizures, no headaches, no temporary blindness one eye,  no slurred speech, possible peripheral neuropathy, + chronic pain, + instability of gait, no memory/cognitive dysfunction  Musculoskeletal: + arthritis, no joint swelling, no myalgias, + difficulty walking, limited mobility   Skin:   no rash, no itching, no skin infections, no pressure sores or ulcerations  Psych:   no anxiety, no depression, no nervousness, no unusual recent stress  Eyes:   no blurry vision, no floaters, no recent vision changes, + wears glasses or contacts  ENT:   no hearing loss, no loose or painful teeth, edentulous with full dentures  Hematologic:  + easy bruising, no abnormal bleeding, no clotting disorder, no frequent  epistaxis  Endocrine:  no diabetes, does not check CBG's at home           Physical Exam:   BP 121/64 (BP Location: Right Arm, Patient Position: Sitting, Cuff Size: Small)   Pulse 70   Temp 99.1 F (37.3 C)   Resp 16  Ht 5\' 7"  (1.702 m)   Wt 106 lb (48.1 kg)   SpO2 100% Comment: RA  BMI 16.60 kg/m   General:  Thin, elderly and frail-appearing  HEENT:  Unremarkable   Neck:   no JVD, no bruits, no adenopathy   Chest:   clear to auscultation, symmetrical breath sounds, no wheezes, no rhonchi   CV:   RRR, grade III/VI crescendo/decrescendo murmur heard best at RUSB,  no diastolic murmur  Abdomen:  soft, non-tender, no masses   Extremities:  warm, well-perfused, pulses diminished, no LE edema  Rectal/GU  Deferred  Neuro:   Grossly non-focal and symmetrical throughout  Skin:   Thin and frail but no areas of breakdown   Diagnostic Tests:  ECHOCARDIOGRAM REPORT     Patient Name: JEWELIA BOCCHINO Date of Exam: 09/08/2019  Medical Rec #: [id number] Height: 68.0 in  Accession #: [id number] Weight: 109.0 lb  Date of Birth: [birth year] BSA: 1.579 m  Patient Age: 17 years BP: 122/60 mmHg  Patient Gender: F HR: 69 bpm.  Exam Location: [facility]   Procedure: 2D Echo   Indications: Aortic stenosis, moderate [I35.0 (ICD-10-CM)]   History: Patient has prior history of Echocardiogram examinations,  most  recent 10/04/2018. Signs/Symptoms:Dyspnea; Risk  Factors:Hypertension, Diabetes and Dyslipidemia.   Sonographer: Luane School  Referring Phys: [phone] Cotton    1. Left ventricular ejection fraction, by estimation, is 55 to 60%. The  left ventricle has normal function. The left ventricle has no regional  wall motion abnormalities. There is mild concentric left ventricular  hypertrophy. Left ventricular diastolic  parameters are consistent with Grade II diastolic dysfunction  (pseudonormalization).  2. Right ventricular systolic function is normal. The right  ventricular  size is normal. There is normal pulmonary artery systolic pressure.  3. The mitral valve is normal in structure. Trivial mitral valve  regurgitation. No evidence of mitral stenosis.  4. The aortic valve is thickened and heavily calcified. Aortic valve  regurgitation is mild. Critical Aortic stenosis. Aortic valve area, by VTI  measures 0.60 cm. Aortic valve mean gradient measures 61.0 mmHg. Aortic  valve Vmax measures 4.70 m/s.  5. The inferior vena cava is normal in size with greater than 50%  respiratory variability, suggesting right atrial pressure of 3 mmHg.   Comparison(s): In comparison to study done in 10/14/2018 aortic stenosis is  now critical aortic stenosis.   FINDINGS  Left Ventricle: Left ventricular ejection fraction, by estimation, is 55  to 60%. The left ventricle has normal function. The left ventricle has no  regional wall motion abnormalities. The left ventricular internal cavity  size was normal in size. There is  mild concentric left ventricular hypertrophy. Left ventricular diastolic  parameters are consistent with Grade II diastolic dysfunction  (pseudonormalization).   Right Ventricle: The right ventricular size is normal. No increase in  right ventricular wall thickness. Right ventricular systolic function is  normal. There is normal pulmonary artery systolic pressure. The tricuspid  regurgitant velocity is 2.62 m/s, and  with an assumed right atrial pressure of 8 mmHg, the estimated right  ventricular systolic pressure is 69.6 mmHg.   Left Atrium: Left atrial size was normal in size.   Right Atrium: Right atrial size was normal in size.   Pericardium: A small pericardial effusion is present. The pericardial  effusion is anterior to the right ventricle. There is no evidence of  cardiac tamponade.   Mitral Valve: The mitral valve is normal in structure. Normal mobility  of  the mitral valve leaflets. Trivial mitral valve regurgitation. No  evidence  of mitral valve stenosis.   Tricuspid Valve: The tricuspid valve is normal in structure. Tricuspid  valve regurgitation is not demonstrated. No evidence of tricuspid  stenosis.   Aortic Valve: The aortic valve is abnormal. . There is moderate thickening  and moderate calcification of the aortic valve. Aortic valve regurgitation  is mild. Critical Aortic stenosis. Mild aortic valve annular  calcification. There is moderate thickening  of the aortic valve. There is moderate calcification of the aortic valve.  Aortic valve mean gradient measures 61.0 mmHg. Aortic valve peak gradient  measures 88.4 mmHg. Aortic valve area, by VTI measures 0.60 cm.   Pulmonic Valve: The pulmonic valve was normal in structure. Pulmonic valve  regurgitation is not visualized. No evidence of pulmonic stenosis.   Aorta: The aortic root is normal in size and structure.   Venous: The inferior vena cava is normal in size with greater than 50%  respiratory variability, suggesting right atrial pressure of 3 mmHg.   IAS/Shunts: No atrial level shunt detected by color flow Doppler.    LEFT VENTRICLE  PLAX 2D  LVIDd: 4.00 cm Diastology  LVIDs: 2.75 cm LV e' lateral: 4.24 cm/s  LV PW: 1.20 cm LV E/e' lateral: 17.4  LV IVS: 1.10 cm LV e' medial: 3.92 cm/s  LVOT diam: 2.00 cm LV E/e' medial: 18.8  LV SV: 69  LV SV Index: 44  LVOT Area: 3.14 cm    RIGHT VENTRICLE IVC  RV S prime: 11.55 cm/s IVC diam: 2.00 cm  TAPSE (M-mode): 3.2 cm   LEFT ATRIUM Index RIGHT ATRIUM Index  LA diam: 2.90 cm 1.84 cm/m RA Area: 13.30 cm  LA Vol (A2C): 40.4 ml 25.59 ml/m RA Volume: 35.10 ml 22.23 ml/m  LA Vol (A4C): 42.6 ml 26.98 ml/m  LA Biplane Vol: 45.2 ml 28.62 ml/m  AORTIC VALVE  AV Area (Vmax): 0.58 cm  AV Area (Vmean): 0.54 cm  AV Area (VTI): 0.60 cm  AV Vmax: 470.00 cm/s  AV Vmean: 370.000 cm/s  AV VTI: 1.160 m  AV Peak Grad: 88.4 mmHg  AV Mean Grad: 61.0 mmHg  LVOT Vmax: 86.50 cm/s  LVOT  Vmean: 63.800 cm/s  LVOT VTI: 0.220 m  LVOT/AV VTI ratio: 0.19   AORTA  Ao Root diam: 2.80 cm  Ao Asc diam: 3.20 cm   MV E velocity: 73.70 cm/s TRICUSPID VALVE  MV A velocity: 78.10 cm/s TR Peak grad: 27.5 mmHg  MV E/A ratio: 0.94 TR Vmax: 262.00 cm/s   SHUNTS  Systemic VTI: 0.22 m  Systemic Diam: 2.00 cm   Kardie Tobb DO  Electronically signed by Berniece Salines DO  Signature Date/Time: 09/08/2019/12:30:25 PM     RIGHT/LEFT HEART CATH AND CORONARY ANGIOGRAPHY  Conclusion    Prox RCA lesion is 25% stenosed.  Ost LAD to Prox LAD lesion is 25% stenosed.  1st Diag lesion is 25% stenosed.  Mid LAD lesion is 30% stenosed.  There is severe aortic valve stenosis, based on prior echo.  Ao 100%, PA sat 80%, PA 19/4, mean PA pressure 9 mm Hg, mean PCWP 5 mm Hg; CO 5.9 L/min; CI 3.97   Nonobstructive CAD.  Normal right heart pressures.  Valve not crossed.   Continue plans for TAVR workup.   Recommendations  Discharge Date In the absence of any other complications or medical issues, we expect the patient to be ready for discharge on 10/12/2019.  Indications  Nonrheumatic  aortic valve stenosis [I35.0 (ICD-10-CM)]  Procedural Details  Technical Details The risks, benefits, and details of the procedure were explained to the patient.  The patient verbalized understanding and wanted to proceed.  Informed written consent was obtained.  PROCEDURE TECHNIQUE:  After Xylocaine anesthesia, a 5 French sheath was placed in the right antecubital area. A 5 French balloontipped Swan-Ganz catheter was advanced to the pulmonary artery under fluoroscopic guidance. Hemodynamic pressures were obtained. Oxygen saturations were obtained. After Xylocaine anesthesia, a 51F sheath was placed in the right radial artery with a single anterior needle wall stick with U/s guidance.   Left coronary angiography was done using a Judkins L3.5 guide catheter.  Right coronary angiography was done using a Judkins R4  guide catheter. Left heart cath was not done, unable to cross the valve.  Attempts made with straight wire and JR4 and AL1.      Contrast: 50 cc  Estimated blood loss <50 mL.   During this procedure medications were administered to achieve and maintain moderate conscious sedation while the patient's heart rate, blood pressure, and oxygen saturation were continuously monitored and I was present face-to-face 100% of this time.  Medications (Filter: Administrations occurring from 1103 to 1208 on 10/12/19) (important) Continuous medications are totaled by the amount administered until 10/12/19 1208.  fentaNYL (SUBLIMAZE) injection (mcg) Total dose:  25 mcg Date/Time  Rate/Dose/Volume Action  10/12/19 1125  25 mcg Given    midazolam (VERSED) injection (mg) Total dose:  1 mg Date/Time  Rate/Dose/Volume Action  10/12/19 1125  1 mg Given    Heparin (Porcine) in NaCl 1000-0.9 UT/500ML-% SOLN (mL) Total volume:  1,000 mL Date/Time  Rate/Dose/Volume Action  10/12/19 1127  500 mL Given  1127  500 mL Given    lidocaine (PF) (XYLOCAINE) 1 % injection (mL) Total volume:  5 mL Date/Time  Rate/Dose/Volume Action  10/12/19 1128  5 mL Given    Radial Cocktail/Verapamil only (mL) Total volume:  10 mL Date/Time  Rate/Dose/Volume Action  10/12/19 1128  10 mL Given    heparin sodium (porcine) injection (Units) Total dose:  2,000 Units Date/Time  Rate/Dose/Volume Action  10/12/19 1203  2,000 Units Given    iohexol (OMNIPAQUE) 350 MG/ML injection (mL) Total volume:  50 mL Date/Time  Rate/Dose/Volume Action  10/12/19 1203  50 mL Given    Sedation Time  Sedation Time Physician-1: 34 minutes 59 seconds  Contrast  Medication Name Total Dose  iohexol (OMNIPAQUE) 350 MG/ML injection 50 mL    Radiation/Fluoro  Fluoro time: 12.3 (min) DAP: 5081 (mGycm2) Cumulative Air Kerma: 87 (mGy)  Complications  Complications documented before study signed (10/12/2019 11:94 PM)   No complications  were associated with this study.  Documented by Jettie Booze, MD - 10/12/2019 12:10 PM    Coronary Findings  Diagnostic Dominance: Right Left Anterior Descending  Ost LAD to Prox LAD lesion is 25% stenosed.  Mid LAD lesion is 30% stenosed.  First Diagonal Branch  1st Diag lesion is 25% stenosed.  Left Circumflex  There is mild diffuse disease throughout the vessel.  Right Coronary Artery  Prox RCA lesion is 25% stenosed.  Intervention  No interventions have been documented. Right Heart  Right Heart Pressures Ao 100%, PA sat 80%, PA 19/4, mean PA pressure 9 mm Hg, mean PCWP 5 mm Hg; CO 5.9 L/min; CI 3.97  Left Heart  Aortic Valve There is severe aortic valve stenosis. Known severe AS  Coronary Diagrams  Diagnostic Dominance: Right  Intervention  Implants   No implant documentation for this case.  Syngo Images  Show images for CARDIAC CATHETERIZATION Images on Long Term Storage  Show images for Meka, Lewan to Procedure Log  Procedure Log    Hemo Data   Most Recent Value  Fick Cardiac Output 5.97 L/min  Fick Cardiac Output Index 3.97 (L/min)/BSA  RA A Wave 3 mmHg  RA V Wave 1 mmHg  RA Mean 0 mmHg  RV Systolic Pressure 24 mmHg  RV Diastolic Pressure 0 mmHg  RV EDP 3 mmHg  PA Systolic Pressure 19 mmHg  PA Diastolic Pressure 4 mmHg  PA Mean 9 mmHg  PW A Wave 9 mmHg  PW V Wave 9 mmHg  PW Mean 5 mmHg  AO Systolic Pressure 517 mmHg  AO Diastolic Pressure 48 mmHg  AO Mean 74 mmHg  QP/QS 1  TPVR Index 2.26 HRUI  TSVR Index 18.61 HRUI  TPVR/TSVR Ratio 0.12    Cardiac TAVR CT  TECHNIQUE: The patient was scanned on a Siemens Force 001 slice scanner. A 120 kV retrospective scan was triggered in the descending thoracic aorta at 111 HU's. Gantry rotation speed was 270 msecs and collimation was .9 mm. No beta blockade or nitro were given. The 3D data set was reconstructed in 5% intervals of the R-R cycle. Systolic and diastolic phases  were analyzed on a dedicated work station using MPR, MIP and VRT modes. The patient received 80 cc of contrast.  FINDINGS: Aortic Valve: Tri cuspid calcified with restricted leaflet motion  Aorta: Moderate mixed calcific plaque with normal arch vessels  Sinotubular Junction: 24 mm with calcification  Ascending Thoracic Aorta: 29 mm  Aortic Arch: 29 mm  Descending Thoracic Aorta: 20 mm  Sinus of Valsalva Measurements:  Non-coronary: 28.1 mm  Right - coronary: 25.6 mm  Left - coronary: 28.6 mm  Coronary Artery Height above Annulus:  Left Main: 11 mm above annulus  Right Coronary: 14.9 mm above annulus  Virtual Basal Annulus Measurements:  Maximum/Minimum Diameter: 25.9 mm x 21.9 mm  Perimeter: 76 mm  Area: 445 mm 2  Coronary Arteries: Sufficient height above annulus for deployment  Optimum Fluoroscopic Angle for Delivery: LAO 5 Caudal 8 degrees  IMPRESSION: 1. Tri leaflet AV with annular area of 445 mm 2 suitable for a 26 mm Sapien 3 valve  2.  Coronary arteries suitable height above annulus for deployment  3.  Optimum angiographic angle for deployment LAO 5 Caudal 8 degrees  4.  Normal aortic root 2.9 cm  Jenkins Rouge   Electronically Signed   By: Jenkins Rouge M.D.   On: 10/19/2019 15:06    CT ANGIOGRAPHY CHEST, ABDOMEN AND PELVIS  TECHNIQUE: Non-contrast CT of the chest was initially obtained.  Multidetector CT imaging through the chest, abdomen and pelvis was performed using the standard protocol during bolus administration of intravenous contrast. Multiplanar reconstructed images and MIPs were obtained and reviewed to evaluate the vascular anatomy.  CONTRAST:  29mL OMNIPAQUE IOHEXOL 350 MG/ML SOLN  COMPARISON:  CT the abdomen and pelvis 08/25/2015.  FINDINGS: CTA CHEST FINDINGS  Cardiovascular: Heart size is normal. There is no significant pericardial fluid, thickening or pericardial calcification. There  is aortic atherosclerosis, as well as atherosclerosis of the great vessels of the mediastinum and the coronary arteries, including calcified atherosclerotic plaque in the left main, left anterior descending and left circumflex coronary arteries. Severe thickening calcification of the aortic valve.  Mediastinum/Lymph Nodes: No pathologically enlarged mediastinal or hilar lymph nodes. Esophagus  is unremarkable in appearance. No axillary lymphadenopathy.  Lungs/Pleura: Nodular scarring and architectural distortion in the anterior aspect of the right lower lobe abutting the major fissure, very similar to prior CT the abdomen and pelvis 08/25/2015. 4 mm subpleural nodule in the posterior aspect of the left upper lobe abutting the major fissure (axial image 29 of series 8). A few other scattered 1-2 mm pulmonary nodules are noted in the periphery of the lungs bilaterally, likely to reflect areas of mild chronic mucoid impaction within terminal bronchioles. No other larger more suspicious appearing pulmonary nodules or masses are noted. No acute consolidative airspace disease. No pleural effusions.  Musculoskeletal/Soft Tissues: There are no aggressive appearing lytic or blastic lesions noted in the visualized portions of the skeleton.  CTA ABDOMEN AND PELVIS FINDINGS  Hepatobiliary: No suspicious cystic or solid hepatic lesions. No intra or extrahepatic biliary ductal dilatation. Gallbladder is normal in appearance.  Pancreas: No pancreatic mass. No pancreatic ductal dilatation. No pancreatic or peripancreatic fluid collections or inflammatory changes.  Spleen: Unremarkable.  Adrenals/Urinary Tract: Bilateral adrenal glands in the right kidney are normal in appearance. Left kidney is severely atrophic. Left hydroureteronephrosis which terminates in the distal third of the left ureter, suggesting a chronic left ureteral stricture. Urinary bladder is normal in  appearance.  Stomach/Bowel: Normal appearance of the stomach. No pathologic dilatation of small bowel or colon. Numerous colonic diverticulae are noted, without surrounding inflammatory changes to suggest an acute diverticulitis at this time. The appendix is not confidently identified and may be surgically absent. Regardless, there are no inflammatory changes noted adjacent to the cecum to suggest the presence of an acute appendicitis at this time.  Vascular/Lymphatic: Aortic atherosclerosis with fusiform infrarenal abdominal aortic aneurysm measuring up to 3.1 x 3.3 cm in diameter ( axial image 131 of series 7). Vascular findings and measurements pertinent to potential TAVR procedure, as detailed below. No pathologically enlarged abdominal or pelvic lymph nodes.  Reproductive: Status post hysterectomy. Ovaries are not confidently identified may be surgically absent or atrophic  Other: No significant volume of ascites.  No pneumoperitoneum.  Musculoskeletal: There are no aggressive appearing lytic or blastic lesions noted in the visualized portions of the skeleton.  VASCULAR MEASUREMENTS PERTINENT TO TAVR:  AORTA:  Minimal Aortic Diameter-21 x 18 mm  Severity of Aortic Calcification-moderate  RIGHT PELVIS:  Right Common Iliac Artery -  Minimal Diameter-8.6 x 9.0 mm  Tortuosity-mild  Calcification-mild-to-moderate  Right External Iliac Artery -  Minimal Diameter-6.8 x 6.1 mm  Tortuosity - mild  Calcification-none  Right Common Femoral Artery -  Minimal Diameter-6.8 x 6.2 mm  Tortuosity - mild  Calcification-mild-to-moderate  LEFT PELVIS:  Left Common Iliac Artery -  Minimal Diameter-9.4 x 9.1 mm  Tortuosity - mild  Calcification-mild-to-moderate  Left External Iliac Artery -  Minimal Diameter-6.5 x 6.0 mm  Tortuosity - mild  Calcification-none  Left Common Femoral Artery -  Minimal Diameter-6.4 x 6.1  mm  Tortuosity - mild  Calcification-mild-to-moderate  Review of the MIP images confirms the above findings.  IMPRESSION: 1. Vascular findings and measurements pertinent to potential TAVR procedure, as detailed above. 2. Severe thickening calcification of the aortic valve, compatible with reported clinical history of severe aortic stenosis. 3. Small pulmonary nodules in the lungs measuring 4 mm or less in size, nonspecific, but statistically benign. No follow-up needed if patient is low-risk (and has no known or suspected primary neoplasm). Non-contrast chest CT can be considered in 12 months if patient is high-risk. This recommendation  follows the consensus statement: Guidelines for Management of Incidental Pulmonary Nodules Detected on CT Images: From the Fleischner Society 2017; Radiology 2017; 284:228-243. 4. Fusiform infrarenal abdominal aortic aneurysm measuring up to 3.1 x 3.3 cm in diameter. Recommend followup by ultrasound in 3 years. This recommendation follows ACR consensus guidelines: White Paper of the ACR Incidental Findings Committee II on Vascular Findings. J Am Coll Radiol 2013; 10:789-794. 5. Colonic diverticulosis without evidence of acute diverticulitis at this time. 6. Additional incidental findings, as above.   Electronically Signed   By: Vinnie Langton M.D.   On: 10/19/2019 14:40    EKG: NSR w/out significant AV conduction delay   Impression:  Patient has stage D1 severe symptomatic aortic stenosis.  She lives a fairly sedentary lifestyle and overall minimizes symptoms, although she does admit to decreased energy and exertional shortness of breath consistent with chronic diastolic congestive heart failure, New York Heart Association functional class II.  She has also had some lower extremity edema and increasing dizzy spells without history of syncope.  I have personally reviewed the patient's recent transthoracic echocardiogram, diagnostic  cardiac catheterization, and CT angiograms.  Echocardiogram reveals severe aortic stenosis with preserved left ventricular systolic function.  The aortic valve is trileaflet with severe thickening, calcification, and restricted leaflet mobility involving all 3 leaflets of the aortic valve.  Peak velocity across aortic valve measured 4.7 m/s corresponding to mean transvalvular gradient estimated 60 mmHg and aortic valve area calculated only 0.6 cm.  Left ventricular ejection fraction was estimated 50 to 55%.  Diagnostic cardiac catheterization is notable for the absence of significant coronary artery disease and normal right-sided pressures.  I agree the patient would benefit from aortic valve replacement.  However, I would not consider this elderly patient with poor mobility a candidate for conventional surgical aortic valve replacement.  Options include transcatheter aortic valve replacement or long-term palliative medical care.  Cardiac-gated CTA of the heart reveals anatomical characteristics consistent with aortic stenosis suitable for treatment by transcatheter aortic valve replacement without any significant complicating features and CTA of the aorta and iliac vessels demonstrate what appears to be adequate pelvic vascular access to facilitate a transfemoral approach.  Baseline EKG reveals sinus rhythm without significant AV conduction delay.    Plan:  The patient and her daughter were counseled at length regarding treatment alternatives for management of severe symptomatic aortic stenosis. Alternative approaches such as conventional aortic valve replacement, transcatheter aortic valve replacement, and continued medical therapy without intervention were compared and contrasted at length.  The risks associated with conventional surgical aortic valve replacement were discussed in detail, as were expectations for post-operative convalescence, and why I would be reluctant to consider this patient a  candidate for conventional surgery.  Issues specific to transcatheter aortic valve replacement were discussed including questions about long term valve durability, the potential for paravalvular leak, possible increased risk of need for permanent pacemaker placement, and other technical complications related to the procedure itself.  Long-term prognosis with medical therapy was discussed. This discussion was placed in the context of the patient's own specific clinical presentation and past medical history.  All of their questions have been addressed.  The patient desires to proceed with transcatheter aortic valve replacement as soon as possible.  Following the decision to proceed with transcatheter aortic valve replacement, a discussion has been held regarding what types of management strategies would be attempted intraoperatively in the event of life-threatening complications, including whether or not the patient would be considered a candidate  for the use of cardiopulmonary bypass and/or conversion to open sternotomy for attempted surgical intervention.  The patient specifically requests that should a potentially life-threatening complication develop we would not attempt emergency median sternotomy and/or other aggressive surgical procedures.  The patient has been advised of a variety of complications that might develop including but not limited to risks of death, stroke, paravalvular leak, aortic dissection or other major vascular complications, aortic annulus rupture, device embolization, cardiac rupture or perforation, mitral regurgitation, acute myocardial infarction, arrhythmia, heart block or bradycardia requiring permanent pacemaker placement, congestive heart failure, respiratory failure, renal failure, pneumonia, infection, other late complications related to structural valve deterioration or migration, or other complications that might ultimately cause a temporary or permanent loss of functional  independence or other long term morbidity.  The patient provides full informed consent for the procedure as described and all questions were answered.    I spent in excess of 90 minutes during the conduct of this office consultation and >50% of this time involved direct face-to-face encounter with the patient for counseling and/or coordination of their care.      Valentina Gu. Roxy Manns, MD 10/20/2019 3:08 PM

## 2019-10-20 NOTE — Patient Instructions (Signed)
  Continue taking all current medications without change through the day before surgery.  Make sure to bring all of your medications with you when you come for your Pre-Admission Testing appointment at [facility] Short-Stay Department.  Have nothing to eat or drink after midnight the night before surgery.  On the morning of surgery take only Synthroid with a sip of water.  At your appointment for [facility] you will be asked to sign permission forms for your upcoming surgery.  By definition your signature on these forms implies that you and/or your designee provide full informed consent for your planned surgical procedure(s), that alternative treatment options have been discussed, that you understand and accept any and all potential risks, and that you have some understanding of what to expect for your post-operative convalescence.  For any major cardiac surgical procedure potential operative risks include but are not limited to at least some risk of death, stroke or other neurologic complication, myocardial infarction, congestive heart failure, respiratory failure, renal failure, bleeding requiring blood transfusion and/or reexploration, irregular heart rhythm, heart block or bradycardia requiring permanent pacemaker, pneumonia, pericardial effusion, pleural effusion, wound infection, pulmonary embolus or other thromboembolic complication, chronic pain, or other complications related to the specific procedure(s) performed.  For transcatheter aortic valve replacement additional risks include but are not limited to risk of paravalvular leak, valve embolization, valve thrombosis, aortic dissection, aortic rupture, ventricular septal defect or perforation, pericardial tamponade, injury of the abdominal aorta or its branches, and/or injury or occlusion of the arteries going to your arms or legs.  Please call to schedule  a follow-up appointment in our office prior to surgery if you have any unresolved questions about your planned surgical procedure, the associated risks, alternative treatment options, and/or expectations for your post-operative recovery.       

## 2019-10-20 NOTE — Pre-Procedure Instructions (Signed)
[id number] [address], [place] [address] Phone: [phone] Fax: [phone]  [facility] [place], [place] [address] [place] [address] Phone: [phone] Fax: [phone]      Your procedure is scheduled on Tuesday June 29th   Report to [facility] Main Entrance "A" at 8:45 A.M., and check in at the Admitting office.  Call this number if you have problems the morning of surgery:  [phone]  Call [phone] if you have any questions prior to your surgery date Monday-Friday 8am-4pm    Remember:  Do not eat or drink after midnight the night before your surgery   Take these medicines the morning of surgery with A SIP OF WATER - NONE  As of today, STOP taking any Aspirin (unless otherwise instructed by your surgeon) and Aspirin containing products, Aleve, Naproxen, Ibuprofen, Motrin, Advil, Goody's, BC's, all herbal medications, fish oil, and all vitamins.                      Do not wear jewelry, make up, or nail polish            Do not wear lotions, powders, perfumes/colognes, or deodorant.            Do not shave 48 hours prior to surgery.  Men may shave face and neck.            Do not bring valuables to the hospital.            [facility] is not responsible for any belongings or valuables.  Do NOT Smoke (Tobacco/Vapping) or drink Alcohol 24 hours prior to your procedure If you use a CPAP at night, you may bring all equipment for your overnight stay.   Contacts, glasses, dentures or bridgework may not be worn into surgery.      For patients admitted to the hospital, discharge time will be determined by your treatment team.   Patients discharged the day of surgery will not be allowed to drive home, and someone needs to stay with them for 24 hours.    Special instructions:   [facility]- Preparing For Surgery  Before surgery, you can play an  important role. Because skin is not sterile, your skin needs to be as free of germs as possible. You can reduce the number of germs on your skin by washing with CHG (chlorahexidine gluconate) Soap before surgery.  CHG is an antiseptic cleaner which kills germs and bonds with the skin to continue killing germs even after washing.    Oral Hygiene is also important to reduce your risk of infection.  Remember - BRUSH YOUR TEETH THE MORNING OF SURGERY WITH YOUR REGULAR TOOTHPASTE  Please do not use if you have an allergy to CHG or antibacterial soaps. If your skin becomes reddened/irritated stop using the CHG.  Do not shave (including legs and underarms) for at least 48 hours prior to first CHG shower. It is OK to shave your face.  Please follow these instructions carefully.   1. Shower the NIGHT BEFORE SURGERY and the MORNING OF SURGERY with CHG Soap.   2. If you chose to wash your hair, wash your hair first as usual with your normal shampoo.  3. After you shampoo, rinse your hair and body thoroughly to remove the shampoo.  4. Use CHG as you would any other liquid soap. You  can apply CHG directly to the skin and wash gently with a scrungie or a clean washcloth.   5. Apply the CHG Soap to your body ONLY FROM THE NECK DOWN.  Do not use on open wounds or open sores. Avoid contact with your eyes, ears, mouth and genitals (private parts). Wash Face and genitals (private parts)  with your normal soap.   6. Wash thoroughly, paying special attention to the area where your surgery will be performed.  7. Thoroughly rinse your body with warm water from the neck down.  8. DO NOT shower/wash with your normal soap after using and rinsing off the CHG Soap.  9. Pat yourself dry with a CLEAN TOWEL.  10. Wear CLEAN PAJAMAS to bed the night before surgery, wear comfortable clothes the morning of surgery  11. Place CLEAN SHEETS on your bed the night of your first shower and DO NOT SLEEP WITH PETS.   Day of  Surgery:   Do not apply any deodorants/lotions.  Please wear clean clothes to the hospital/surgery center.   Remember to brush your teeth WITH YOUR REGULAR TOOTHPASTE.   Please read over the following fact sheets that you were given.

## 2019-10-21 ENCOUNTER — Other Ambulatory Visit (HOSPITAL_COMMUNITY)
Admission: RE | Admit: 2019-10-21 | Discharge: 2019-10-21 | Disposition: A | Discharge status: Home / Self Care | Payer: Medicare HMO | Source: Ambulatory Visit | Attending: Cardiovascular Disease | Admitting: Cardiovascular Disease

## 2019-10-21 ENCOUNTER — Encounter (HOSPITAL_COMMUNITY)
Admission: RE | Admit: 2019-10-21 | Discharge: 2019-10-21 | Disposition: A | Discharge status: Home / Self Care | Payer: Medicare HMO | Source: Ambulatory Visit | Attending: Cardiovascular Disease | Admitting: Cardiovascular Disease

## 2019-10-21 ENCOUNTER — Ambulatory Visit: Payer: Medicare HMO | Attending: Cardiovascular Disease | Admitting: Physical Therapy

## 2019-10-21 ENCOUNTER — Encounter (HOSPITAL_COMMUNITY): Payer: Self-pay

## 2019-10-21 ENCOUNTER — Ambulatory Visit (HOSPITAL_COMMUNITY)
Admission: RE | Admit: 2019-10-21 | Discharge: 2019-10-21 | Disposition: A | Discharge status: Home / Self Care | Payer: Medicare HMO | Source: Ambulatory Visit | Attending: Cardiovascular Disease | Admitting: Cardiovascular Disease

## 2019-10-21 ENCOUNTER — Encounter: Payer: Self-pay | Admitting: Physical Therapy

## 2019-10-21 ENCOUNTER — Other Ambulatory Visit: Payer: Self-pay

## 2019-10-21 DIAGNOSIS — I350 Nonrheumatic aortic (valve) stenosis: Secondary | ICD-10-CM | POA: Diagnosis not present

## 2019-10-21 DIAGNOSIS — N183 Chronic kidney disease, stage 3 unspecified: Secondary | ICD-10-CM | POA: Insufficient documentation

## 2019-10-21 DIAGNOSIS — K219 Gastro-esophageal reflux disease without esophagitis: Secondary | ICD-10-CM | POA: Insufficient documentation

## 2019-10-21 DIAGNOSIS — Z79890 Hormone replacement therapy (postmenopausal): Secondary | ICD-10-CM | POA: Insufficient documentation

## 2019-10-21 DIAGNOSIS — E785 Hyperlipidemia, unspecified: Secondary | ICD-10-CM | POA: Insufficient documentation

## 2019-10-21 DIAGNOSIS — I129 Hypertensive chronic kidney disease with stage 1 through stage 4 chronic kidney disease, or unspecified chronic kidney disease: Secondary | ICD-10-CM | POA: Insufficient documentation

## 2019-10-21 DIAGNOSIS — Z01818 Encounter for other preprocedural examination: Secondary | ICD-10-CM | POA: Insufficient documentation

## 2019-10-21 DIAGNOSIS — C884 Extranodal marginal zone B-cell lymphoma of mucosa-associated lymphoid tissue [MALT-lymphoma]: Secondary | ICD-10-CM | POA: Insufficient documentation

## 2019-10-21 DIAGNOSIS — Z20822 Contact with and (suspected) exposure to covid-19: Secondary | ICD-10-CM | POA: Diagnosis not present

## 2019-10-21 DIAGNOSIS — E039 Hypothyroidism, unspecified: Secondary | ICD-10-CM | POA: Insufficient documentation

## 2019-10-21 DIAGNOSIS — I714 Abdominal aortic aneurysm, without rupture: Secondary | ICD-10-CM | POA: Insufficient documentation

## 2019-10-21 DIAGNOSIS — R262 Difficulty in walking, not elsewhere classified: Secondary | ICD-10-CM | POA: Diagnosis present

## 2019-10-21 DIAGNOSIS — Z7952 Long term (current) use of systemic steroids: Secondary | ICD-10-CM | POA: Insufficient documentation

## 2019-10-21 DIAGNOSIS — I700 Atherosclerosis of aorta: Secondary | ICD-10-CM | POA: Insufficient documentation

## 2019-10-21 DIAGNOSIS — M3500 Sicca syndrome, unspecified: Secondary | ICD-10-CM | POA: Insufficient documentation

## 2019-10-21 DIAGNOSIS — Z79899 Other long term (current) drug therapy: Secondary | ICD-10-CM | POA: Insufficient documentation

## 2019-10-21 LAB — BLOOD GAS, ARTERIAL
Acid-Base Excess: 2.7 mmol/L — ABNORMAL HIGH (ref 0.0–2.0)
Bicarbonate: 26.6 mmol/L (ref 20.0–28.0)
Drawn by: 50144
FIO2: 21
O2 Saturation: 98.4 %
Patient temperature: 37
pCO2 arterial: 40.2 mmHg (ref 32.0–48.0)
pH, Arterial: 7.436 (ref 7.350–7.450)
pO2, Arterial: 112 mmHg — ABNORMAL HIGH (ref 83.0–108.0)

## 2019-10-21 LAB — URINALYSIS, ROUTINE W REFLEX MICROSCOPIC
Bilirubin Urine: NEGATIVE
Glucose, UA: NEGATIVE mg/dL
Hgb urine dipstick: NEGATIVE
Ketones, ur: NEGATIVE mg/dL
Leukocytes,Ua: NEGATIVE
Nitrite: NEGATIVE
Protein, ur: NEGATIVE mg/dL
Specific Gravity, Urine: 1.009 (ref 1.005–1.030)
pH: 7 (ref 5.0–8.0)

## 2019-10-21 LAB — CBC
HCT: 40.4 % (ref 36.0–46.0)
Hemoglobin: 12.8 g/dL (ref 12.0–15.0)
MCH: 30.8 pg (ref 26.0–34.0)
MCHC: 31.7 g/dL (ref 30.0–36.0)
MCV: 97.1 fL (ref 80.0–100.0)
Platelets: 121 10*3/uL — ABNORMAL LOW (ref 150–400)
RBC: 4.16 MIL/uL (ref 3.87–5.11)
RDW: 12.7 % (ref 11.5–15.5)
WBC: 5.5 10*3/uL (ref 4.0–10.5)
nRBC: 0 % (ref 0.0–0.2)

## 2019-10-21 LAB — COMPREHENSIVE METABOLIC PANEL
ALT: 12 U/L (ref 0–44)
AST: 19 U/L (ref 15–41)
Albumin: 3.9 g/dL (ref 3.5–5.0)
Alkaline Phosphatase: 47 U/L (ref 38–126)
Anion gap: 11 (ref 5–15)
BUN: 15 mg/dL (ref 8–23)
CO2: 28 mmol/L (ref 22–32)
Calcium: 9.4 mg/dL (ref 8.9–10.3)
Chloride: 101 mmol/L (ref 98–111)
Creatinine, Ser: 1.16 mg/dL — ABNORMAL HIGH (ref 0.44–1.00)
GFR calc Af Amer: 49 mL/min — ABNORMAL LOW (ref >60)
GFR calc non Af Amer: 42 mL/min — ABNORMAL LOW (ref >60)
Glucose, Bld: 76 mg/dL (ref 70–99)
Potassium: 4.1 mmol/L (ref 3.5–5.1)
Sodium: 140 mmol/L (ref 135–145)
Total Bilirubin: 0.8 mg/dL (ref 0.3–1.2)
Total Protein: 6.4 g/dL — ABNORMAL LOW (ref 6.5–8.1)

## 2019-10-21 LAB — SURGICAL PCR SCREEN
MRSA, PCR: NEGATIVE
Staphylococcus aureus: NEGATIVE

## 2019-10-21 LAB — PROTIME-INR
INR: 1 (ref 0.8–1.2)
Prothrombin Time: 12.2 seconds (ref 11.4–15.2)

## 2019-10-21 LAB — TYPE AND SCREEN
ABO/RH(D): A POS
Antibody Screen: NEGATIVE

## 2019-10-21 LAB — HEMOGLOBIN A1C
Hgb A1c MFr Bld: 5.2 % (ref 4.8–5.6)
Mean Plasma Glucose: 102.54 mg/dL

## 2019-10-21 LAB — APTT: aPTT: 28 seconds (ref 24–36)

## 2019-10-21 LAB — SARS CORONAVIRUS 2 (TAT 6-24 HRS): SARS Coronavirus 2: NEGATIVE

## 2019-10-21 LAB — BRAIN NATRIURETIC PEPTIDE: B Natriuretic Peptide: 77 pg/mL (ref 0.0–100.0)

## 2019-10-21 LAB — ABO/RH: ABO/RH(D): A POS

## 2019-10-21 NOTE — Progress Notes (Signed)
PCP: Dr. Garlon Hatchet Cardiologist: Dr. Bettina Gavia  EKG: 10-12-19 CXR: Today ECHO: 09-08-19 Stress Test: denies Cardiac Cath: 10-12-19  Going for Covid testing today after appt, aware to self quarantine   Patient denies shortness of breath, fever, cough, and chest pain at PAT appointment.  Patient verbalized understanding of instructions provided today at the PAT appointment.  Patient asked to review instructions at home and day of surgery.

## 2019-10-21 NOTE — Therapy (Signed)
[place], [place], [address] Phone: [phone]   Fax:  [phone]  Physical Therapy Evaluation  Patient Details  Name: Carmen Blackwell MRN: [id number] Date of Birth: 1930-05-19 Referring Provider (PT): Lauree Chandler, MD   Encounter Date: 10/21/2019   PT End of Session - 10/21/19 1033    Visit Number 1    PT Start Time 2297    PT Stop Time 9892    PT Time Calculation (min) 35 min           Past Medical History:  Diagnosis Date   Acquired hypothyroidism 04/09/2015   Aortic stenosis    Atrophic vaginitis 12/30/2017   Formatting of this note might be different from the original. 2019: ERT vaginally   Bacterial food poisoning 07/20/2019   Formatting of this note might be different from the original. 2021   Benign positional vertigo 04/12/2019   Formatting of this note might be different from the original. 2916, 2020   Chronic kidney disease, stage III (moderate) 04/09/2015   Chronic right-sided low back pain without sciatica 08/07/2017   Cough 08/28/2015   Essential hypertension 08/28/2015   GERD (gastroesophageal reflux disease) 04/09/2015   HLD (hyperlipidemia)    Lumbosacral radiculopathy at L5 06/18/2018   Formatting of this note might be different from the original. 2020   MALT lymphoma ([place]) 10/10/2015   Osteopenia 06/02/2017   Pancytopenia ([place]) 10/10/2015   Primary osteoarthritis involving multiple joints 05/07/2018   Raynaud's disease without gangrene 07/20/2019   Formatting of this note might be different from the original. 2020: onset   Risk for falls 07/14/2016   Sciatica 06/02/2017   Severe aortic stenosis    Sjogren's syndrome ([place])    Throat burning 12/16/2017   Formatting of this note might be different from the original. 2019: chronic   Urge incontinence 05/06/2016    Past Surgical History:  Procedure Laterality Date   ABDOMINAL HYSTERECTOMY     APPENDECTOMY      LUMBAR DISC SURGERY     RIGHT/LEFT HEART CATH AND CORONARY ANGIOGRAPHY N/A 10/12/2019   Procedure: RIGHT/LEFT HEART CATH AND CORONARY ANGIOGRAPHY;  Surgeon: Jettie Booze, MD;  Location: [facility];  Service: Cardiovascular;  Laterality: N/A;   TONSILLECTOMY      There were no vitals filed for this visit.    Subjective Assessment - 10/21/19 1031    Subjective Cold feet and swelling. Very little SOB. Fatigue when walking, walks about 100 feet with cane and then uses rollator or sits in [facility] and gets pushed. When I walk I get some knee pain from time to time and they buckle, with walking gets back pain.    Currently in Pain? No/denies              [facility] PT Assessment - 10/21/19 0001      Assessment   Medical Diagnosis severe aortic stenosis    Referring Provider (PT) Lauree Chandler, MD    Hand Dominance Right      Precautions   Precautions None      Restrictions   Weight Bearing Restrictions No      Balance Screen   Has the patient fallen in the past 6 months No      [place] residence    Living Arrangements Alone    Additional Comments no stairs      Prior Function   Level of Independence Independent  Cognition   Overall Cognitive Status Within Functional Limits for tasks assessed      Sensation   Additional Comments N/T legs and arms comes and goes      Posture/Postural Control   Posture Comments rounded shoulders, forwrad head      ROM / Strength   AROM / PROM / Strength AROM;Strength      AROM   Overall AROM Comments WFL      Strength   Overall Strength Comments UE gross 5/5, LE gross 4/5    Strength Assessment Site Hand    Right/Left hand Right;Left    Right Hand Grip (lbs) 15    Left Hand Grip (lbs) 10            [facility] Pre-Surgical Assessment - 10/21/19 0001    5 Meter Walk Test- trial 1 11 sec    5 Meter Walk Test- trial 2 11 sec.     5 Meter Walk Test- trial 3 12 sec.    5  meter walk test average 11.33 sec    Sit To Stand Test- trial 1 0 sec.    Comment unable without UE use    6 Minute Walk- Baseline yes    BP (mmHg) 115/60    HR (bpm) 80    02 Sat (%RA) 95 %    Modified Borg Scale for Dyspnea 0- Nothing at all    Perceived Rate of Exertion (Borg) 6-    6 Minute Walk Post Test yes    BP (mmHg) 120/60    HR (bpm) 75    02 Sat (%RA) 99 %    Modified Borg Scale for Dyspnea 2- Mild shortness of breath    Perceived Rate of Exertion (Borg) 13- Somewhat hard    Aerobic Endurance Distance Walked 190    Endurance additional comments 85% limited compared to age-related normative values                    Objective measurements completed on examination: See above findings.                            Plan - 10/21/19 1158    PT Frequency One time visit    Consulted and Agree with Plan of Care Patient;Family member/caregiver    Family Member Consulted Daughter           Clinical Impression Statement: Pt is a 86 yo F presenting to OP PT for evaluation prior to possible TAVR surgery due to severe aortic stenosis. Pt reports onset of fatigue and very mild SOB. Symptoms are  limiting her functional endurance. Pt presents with WFL ROM, decreased strength and reports frequent knee pain as well as LBP. Pt and daughter denied her trying sit<>stand test as well as tandem stance. After discussing rationale for walking tests, they agreed that the patient would participate in the testing.  Pt ambulated 65 feet in 2.5 min before requesting a seated rest beak lasting 1 min. At time of rest, patients HR was 77 bpm and O2 fluctuated between 81 & 88 on room air. Pt reported 1/10 shortness of breath on modified scale for dyspnea. Pt able to resume after rest and ambulate an additional 126 feet. Pt ambulated a total of 190 feet in 6 minute walk and reported 3/10 SOB on modified scale for dyspena and 13/20 RPE on Borg's perceived exertion and pain  scale at the end of the walk. During  the 6 minute walk test, patient's HR increased to 83 BPM and O2 saturation decreased to 81%. Based on the Short Physical Performance Battery, patient has a frailty rating of 2/12 with </= 5/12 considered frail.  Visit Diagnosis: Difficulty in walking, not elsewhere classified     Problem List Patient Active Problem List   Diagnosis Date Noted   Bacterial food poisoning 07/20/2019   Raynaud's disease without gangrene 07/20/2019   Benign positional vertigo 04/12/2019   Chronic vulvitis 11/02/2018   Lumbosacral radiculopathy at L5 06/18/2018   Primary osteoarthritis involving multiple joints 05/07/2018   Atrophic vaginitis 12/30/2017   Throat burning 12/16/2017   Chronic right-sided low back pain without sciatica 08/07/2017   Lymphoma ([place]) 06/03/2017   Aortic stenosis    Osteopenia 06/02/2017   Sciatica 06/02/2017   Thrombocytopenia ([place]) 03/09/2017   Risk for falls 07/14/2016   Urge incontinence 05/06/2016   MALT lymphoma ([place]) 10/10/2015   Pancytopenia ([place]) 10/10/2015   Cough 08/28/2015   Hyperlipidemia 08/28/2015   Essential hypertension 08/28/2015   Pain in joints 08/28/2015   Sjogren's syndrome ([place]) 08/28/2015   Mixed hyperlipidemia 08/28/2015   Acquired hypothyroidism 04/09/2015   Chronic kidney disease, stage III (moderate) 04/09/2015   Familial hypercholesterolemia 04/09/2015   GERD (gastroesophageal reflux disease) 04/09/2015   Vani Gunner C. Delyla Sandeen PT, DPT 10/21/19 12:07 PM   [facility] [address] [place], [place], [address] Phone: [phone]   Fax:  [phone]  Name: Carmen Blackwell MRN: [id number] Date of Birth: Sep 09, 1930

## 2019-10-24 MED ORDER — NOREPINEPHRINE 4 MG/250ML-% IV SOLN
0.0000 ug/min | INTRAVENOUS | Status: DC
  Filled 2019-10-24: qty 250

## 2019-10-24 MED ORDER — VANCOMYCIN HCL IN DEXTROSE 1-5 GM/200ML-% IV SOLN
1000.0000 mg | INTRAVENOUS | Status: AC
  Administered 2019-10-25: 1000 mg via INTRAVENOUS
  Filled 2019-10-24 (×2): qty 200

## 2019-10-24 MED ORDER — SODIUM CHLORIDE 0.9 % IV SOLN
1.5000 g | INTRAVENOUS | Status: AC
  Administered 2019-10-25: 1.5 g via INTRAVENOUS
  Filled 2019-10-24: qty 1.5

## 2019-10-24 MED ORDER — SODIUM CHLORIDE 0.9 % IV SOLN
INTRAVENOUS | Status: DC
  Filled 2019-10-24: qty 30

## 2019-10-24 MED ORDER — MAGNESIUM SULFATE 50 % IJ SOLN
40.0000 meq | INTRAMUSCULAR | Status: DC
  Filled 2019-10-24: qty 9.85

## 2019-10-24 MED ORDER — DEXMEDETOMIDINE HCL IN NACL 400 MCG/100ML IV SOLN
0.1000 ug/kg/h | INTRAVENOUS | Status: AC
  Administered 2019-10-25: 24 ug via INTRAVENOUS
  Administered 2019-10-25: .7 ug/kg/h via INTRAVENOUS
  Filled 2019-10-24: qty 100

## 2019-10-24 MED ORDER — POTASSIUM CHLORIDE 2 MEQ/ML IV SOLN
80.0000 meq | INTRAVENOUS | Status: DC
  Filled 2019-10-24: qty 40

## 2019-10-24 NOTE — Progress Notes (Signed)
Anesthesia Chart Review:  Case: [id number] Date/Time: 10/25/19 1030   Procedures:      TRANSCATHETER AORTIC VALVE REPLACEMENT, TRANSFEMORAL (N/A Chest)     TRANSESOPHAGEAL ECHOCARDIOGRAM (TEE) (N/A )   Anesthesia type: General   Pre-op diagnosis: Severe Aortic Stenosis   Location: [facility] OR ROOM 16 / [facility] OR   Surgeons: Burnell Blanks, MD    CT Surgeon: Darylene Price, MD  DISCUSSION: Patient is an 86 year old female scheduled for the above procedure.  History includes never smoker, severe AS, HTN, HLD, Sjogren's syndrome, Raynaud's disease, GERD, CKD stage III, MALT lymphoma (in remission), pancytopenia, BPPV, acquired hypothyroidism. 3.3 AAA on 09/2019 CTA.   10/21/2019 COVID-19 test negative.  She is on chronic prednisone (5 mg daily). Anesthesia team to evaluate on the day of surgery.    VS: BP 115/60   Pulse 78   Temp 36.9 C (Oral)   Resp 18   Ht 5\' 8"  (1.727 m)   Wt 48 kg   SpO2 100%   BMI 16.09 kg/m   PROVIDERS: Algis Greenhouse, MD is PCP  Shirlee More, MD is primary cardiologist Hermelinda Medicus, MD is rheumatologist ([place]). Last visit 10/05/19 for follow-up Sjogren's/Sicca syndromes with keratoconjunctivitis on Plaquenil. She is also on Prednisone 5 mg daily.     LABS: Labs reviewed: Acceptable for surgery. (all labs ordered are listed, but only abnormal results are displayed)  Labs Reviewed  BLOOD GAS, ARTERIAL - Abnormal; Notable for the following components:      Result Value   pO2, Arterial 112 (*)    Acid-Base Excess 2.7 (*)    All other components within normal limits  CBC - Abnormal; Notable for the following components:   Platelets 121 (*)    All other components within normal limits  COMPREHENSIVE METABOLIC PANEL - Abnormal; Notable for the following components:   Creatinine, Ser 1.16 (*)    Total Protein 6.4 (*)    GFR calc non Af Amer 42 (*)    GFR calc Af Amer 49 (*)    All other components within normal limits  SURGICAL PCR  SCREEN  APTT  BRAIN NATRIURETIC PEPTIDE  HEMOGLOBIN A1C  PROTIME-INR  URINALYSIS, ROUTINE W REFLEX MICROSCOPIC  TYPE AND SCREEN  ABO/RH     IMAGES: CXR 10/21/19: IMPRESSION: 1. Stable hyperinflation. Bandlike area of scarring in the right lung base, better to detailed on comparison CT. 2. No acute cardiopulmonary abnormality.  CTA Chest/abd/pelvis 10/19/19: IMPRESSION: 1. Vascular findings and measurements pertinent to potential TAVR procedure, as detailed above. 2. Severe thickening calcification of the aortic valve, compatible with reported clinical history of severe aortic stenosis. 3. Small pulmonary nodules in the lungs measuring 4 mm or less in size, nonspecific, but statistically benign. No follow-up needed if patient is low-risk (and has no known or suspected primary neoplasm). Non-contrast chest CT can be considered in 12 months if patient is high-risk. This recommendation follows the consensus statement: Guidelines for Management of Incidental Pulmonary Nodules Detected on CT Images: From the Fleischner Society 2017; Radiology 2017; 284:228-243. 4. Fusiform infrarenal abdominal aortic aneurysm measuring up to 3.1 x 3.3 cm in diameter. Recommend followup by ultrasound in 3 years. This recommendation follows ACR consensus guidelines: White Paper of the ACR Incidental Findings Committee II on Vascular Findings. J Am Coll Radiol 2013; 10:789-794. 5. Colonic diverticulosis without evidence of acute diverticulitis at this time. 6. Additional incidental findings, as above. [See full report]   EKG: 10/12/19: NSR   CV: CT Coronary  10/19/19: IMPRESSION: 1. Tri leaflet AV with annular area of 445 mm 2 suitable for a 26 mm Sapien 3 valve 2.  Coronary arteries suitable height above annulus for deployment 3.  Optimum angiographic angle for deployment LAO 5 Caudal 8 degrees 4.  Normal aortic root 2.9 cm  Cardiac cath 10/12/19:  Prox RCA lesion is 25% stenosed.  Ost  LAD to Prox LAD lesion is 25% stenosed.  1st Diag lesion is 25% stenosed.  Mid LAD lesion is 30% stenosed.  There is severe aortic valve stenosis, based on prior echo.  Ao 100%, PA sat 80%, PA 19/4, mean PA pressure 9 mm Hg, mean PCWP 5 mm Hg; CO 5.9 L/min; CI 3.97 Nonobstructive CAD.  Normal right heart pressures.  Valve not crossed.  Continue plans for TAVR workup.   Echo 09/08/19: IMPRESSIONS   1. Left ventricular ejection fraction, by estimation, is 55 to 60%. The  left ventricle has normal function. The left ventricle has no regional  wall motion abnormalities. There is mild concentric left ventricular  hypertrophy. Left ventricular diastolic  parameters are consistent with Grade II diastolic dysfunction  (pseudonormalization).  2. Right ventricular systolic function is normal. The right ventricular  size is normal. There is normal pulmonary artery systolic pressure.  3. The mitral valve is normal in structure. Trivial mitral valve  regurgitation. No evidence of mitral stenosis.  4. The aortic valve is thickened and heavily calcified. Aortic valve  regurgitation is mild. Critical Aortic stenosis. Aortic valve area, by VTI  measures 0.60 cm. Aortic valve mean gradient measures 61.0 mmHg. Aortic  valve Vmax measures 4.70 m/s.  5. The inferior vena cava is normal in size with greater than 50%  respiratory variability, suggesting right atrial pressure of 3 mmHg.   6. Mild to moderate Tricuspid regurgitation. Comparison(s): In comparison to study done in 10/14/2018 aortic stenosis is  now critical aortic stenosis.   Carotid US 10/19/19: Summary:  Right Carotid: Velocities in the right ICA are consistent with a 1-39%  stenosis.  Left Carotid: Velocities in the left ICA are consistent with a 1-39%  stenosis.  Vertebrals: Bilateral vertebral arteries demonstrate antegrade flow.  Subclavians: Normal flow hemodynamics were seen in bilateral subclavian        arteries.      Past Medical History:  Diagnosis Date  . Acquired hypothyroidism 04/09/2015  . Aortic stenosis   . Atrophic vaginitis 12/30/2017   Formatting of this note might be different from the original. 2019: ERT vaginally  . Bacterial food poisoning 07/20/2019   Formatting of this note might be different from the original. 2021  . Benign positional vertigo 04/12/2019   Formatting of this note might be different from the original. 2916, 2020  . Chronic kidney disease, stage III (moderate) 04/09/2015  . Chronic right-sided low back pain without sciatica 08/07/2017  . Cough 08/28/2015  . Essential hypertension 08/28/2015  . GERD (gastroesophageal reflux disease) 04/09/2015  . HLD (hyperlipidemia)   . Lumbosacral radiculopathy at L5 06/18/2018   Formatting of this note might be different from the original. 2020  . MALT lymphoma ([place]) 10/10/2015  . Osteopenia 06/02/2017  . Pancytopenia ([place]) 10/10/2015  . Primary osteoarthritis involving multiple joints 05/07/2018  . Raynaud's disease without gangrene 07/20/2019   Formatting of this note might be different from the original. 2020: onset  . Risk for falls 07/14/2016  . Sciatica 06/02/2017  . Severe aortic stenosis   . Sjogren's syndrome ([place])   . Throat burning 12/16/2017  Formatting of this note might be different from the original. 2019: chronic  . Urge incontinence 05/06/2016    Past Surgical History:  Procedure Laterality Date  . ABDOMINAL HYSTERECTOMY    . APPENDECTOMY    . LUMBAR DISC SURGERY    . RIGHT/LEFT HEART CATH AND CORONARY ANGIOGRAPHY N/A 10/12/2019   Procedure: RIGHT/LEFT HEART CATH AND CORONARY ANGIOGRAPHY;  Surgeon: Jettie Booze, MD;  Location: [facility];  Service: Cardiovascular;  Laterality: N/A;  . TONSILLECTOMY      MEDICATIONS: . antiseptic oral rinse (BIOTENE) LIQD  . Calcium Carb-Cholecalciferol (CALCIUM 600+D3 PO)  . furosemide (LASIX) 20 MG tablet  . hydroxychloroquine (PLAQUENIL) 200 MG tablet  .  levothyroxine (SYNTHROID, LEVOTHROID) 50 MCG tablet  . lovastatin (MEVACOR) 20 MG tablet  . Magnesium Gluconate 250 MG TABS  . olopatadine (PATADAY) 0.1 % ophthalmic solution  . potassium gluconate 595 (99 K) MG TABS tablet  . predniSONE (DELTASONE) 5 MG tablet  . traMADol (ULTRAM) 50 MG tablet  . Vaginal Lubricant (REPLENS) GEL   No current facility-administered medications for this encounter.    Myra Gianotti, PA-C Surgical Short Stay/Anesthesiology [facility] Phone [phone] [facility] Phone [phone] 10/24/2019 10:48 AM

## 2019-10-24 NOTE — Anesthesia Preprocedure Evaluation (Addendum)
Anesthesia Evaluation  Patient identified by MRN, date of birth, ID band Patient awake    Reviewed: Allergy & Precautions, NPO status , Patient's Chart, lab work & pertinent test results  History of Anesthesia Complications Negative for: history of anesthetic complications  Airway Mallampati: II  TM Distance: >3 FB Neck ROM: Full    Dental  (+) Edentulous Upper, Edentulous Lower   Pulmonary neg pulmonary ROS,    Pulmonary exam normal        Cardiovascular hypertension, + Valvular Problems/Murmurs AS  Rhythm:Regular Rate:Normal + Systolic murmurs  '21 Carotid US - 1-39% b/l ICAS  '21 Cath - Prox RCA lesion is 25% stenosed. Ost LAD to Prox LAD lesion is 25% stenosed. 1st Diag lesion is 25% stenosed. Mid LAD lesion is 30% stenosed. There is severe aortic valve stenosis, based on prior echo. Ao 100%, PA sat 80%, PA 19/4, mean PA pressure 9 mm Hg, mean PCWP 5 mm Hg; CO 5.9 L/min; CI 3.97  '21 TTE - EF 55 to 60%. Mild concentric LVH. Grade II diastolic dysfunction (pseudonormalization). Trivial MR. Mild AI. Critical AS. Aortic valve area, by VTI measures 0.60 cm. Aortic valve mean gradient measures 61.0 mmHg. Aortic valve Vmax measures 4.70 m/s.        Neuro/Psych  BPPV   Neuromuscular disease (Sciatica) negative psych ROS   GI/Hepatic Neg liver ROS, GERD  ,  Endo/Other  Hypothyroidism   Renal/GU CRFRenal disease     Musculoskeletal  (+) Arthritis ,   Abdominal   Peds  Hematology negative hematology ROS (+)  MALT lymphoma Thrombocytopenia    Anesthesia Other Findings Sjogren's syndrome Covid test negative   Reproductive/Obstetrics                           Anesthesia Physical Anesthesia Plan  ASA: IV  Anesthesia Plan: MAC   Post-op Pain Management:    Induction: Intravenous  PONV Risk Score and Plan: 2 and Propofol infusion and Treatment may vary due to age or  medical condition  Airway Management Planned: Natural Airway and Simple Face Mask  Additional Equipment: None  Intra-op Plan:   Post-operative Plan:   Informed Consent: I have reviewed the patients History and Physical, chart, labs and discussed the procedure including the risks, benefits and alternatives for the proposed anesthesia with the patient or authorized representative who has indicated his/her understanding and acceptance.       Plan Discussed with: CRNA and Anesthesiologist  Anesthesia Plan Comments:       Anesthesia Quick Evaluation

## 2019-10-25 ENCOUNTER — Inpatient Hospital Stay (HOSPITAL_COMMUNITY): Payer: Medicare HMO | Admitting: Vascular Surgery

## 2019-10-25 ENCOUNTER — Encounter (HOSPITAL_COMMUNITY)
Admission: RE | Disposition: A | Discharge status: Home / Self Care | Payer: Self-pay | Source: Home / Self Care | Attending: Thoracic Surgery (Cardiothoracic Vascular Surgery)

## 2019-10-25 ENCOUNTER — Inpatient Hospital Stay (HOSPITAL_COMMUNITY)
Admission: RE | Admit: 2019-10-25 | Discharge: 2019-10-26 | DRG: 267 | Disposition: A | Discharge status: Home / Self Care | Payer: Medicare HMO | Attending: Thoracic Surgery (Cardiothoracic Vascular Surgery) | Admitting: Thoracic Surgery (Cardiothoracic Vascular Surgery)

## 2019-10-25 ENCOUNTER — Telehealth: Payer: Self-pay | Admitting: Cardiology

## 2019-10-25 ENCOUNTER — Other Ambulatory Visit: Payer: Self-pay

## 2019-10-25 ENCOUNTER — Other Ambulatory Visit: Payer: Self-pay | Admitting: Physician Assistant

## 2019-10-25 ENCOUNTER — Inpatient Hospital Stay (HOSPITAL_COMMUNITY): Payer: Medicare HMO | Admitting: Anesthesiology

## 2019-10-25 ENCOUNTER — Encounter (HOSPITAL_COMMUNITY): Payer: Self-pay | Admitting: Cardiovascular Disease

## 2019-10-25 ENCOUNTER — Inpatient Hospital Stay (HOSPITAL_COMMUNITY): Payer: Medicare HMO

## 2019-10-25 DIAGNOSIS — Z8249 Family history of ischemic heart disease and other diseases of the circulatory system: Secondary | ICD-10-CM

## 2019-10-25 DIAGNOSIS — N183 Chronic kidney disease, stage 3 unspecified: Secondary | ICD-10-CM | POA: Diagnosis present

## 2019-10-25 DIAGNOSIS — Z006 Encounter for examination for normal comparison and control in clinical research program: Secondary | ICD-10-CM

## 2019-10-25 DIAGNOSIS — M3500 Sicca syndrome, unspecified: Secondary | ICD-10-CM | POA: Diagnosis present

## 2019-10-25 DIAGNOSIS — I352 Nonrheumatic aortic (valve) stenosis with insufficiency: Secondary | ICD-10-CM | POA: Diagnosis present

## 2019-10-25 DIAGNOSIS — E1122 Type 2 diabetes mellitus with diabetic chronic kidney disease: Secondary | ICD-10-CM | POA: Diagnosis present

## 2019-10-25 DIAGNOSIS — I2510 Atherosclerotic heart disease of native coronary artery without angina pectoris: Secondary | ICD-10-CM | POA: Diagnosis present

## 2019-10-25 DIAGNOSIS — K219 Gastro-esophageal reflux disease without esophagitis: Secondary | ICD-10-CM | POA: Diagnosis present

## 2019-10-25 DIAGNOSIS — Z20822 Contact with and (suspected) exposure to covid-19: Secondary | ICD-10-CM | POA: Diagnosis present

## 2019-10-25 DIAGNOSIS — Z79890 Hormone replacement therapy (postmenopausal): Secondary | ICD-10-CM

## 2019-10-25 DIAGNOSIS — D6959 Other secondary thrombocytopenia: Secondary | ICD-10-CM | POA: Diagnosis present

## 2019-10-25 DIAGNOSIS — E785 Hyperlipidemia, unspecified: Secondary | ICD-10-CM | POA: Diagnosis present

## 2019-10-25 DIAGNOSIS — C884 Extranodal marginal zone b-cell lymphoma of mucosa-associated lymphoid tissue (malt-lymphoma) not having achieved remission: Secondary | ICD-10-CM | POA: Diagnosis present

## 2019-10-25 DIAGNOSIS — D696 Thrombocytopenia, unspecified: Secondary | ICD-10-CM | POA: Diagnosis present

## 2019-10-25 DIAGNOSIS — I130 Hypertensive heart and chronic kidney disease with heart failure and stage 1 through stage 4 chronic kidney disease, or unspecified chronic kidney disease: Secondary | ICD-10-CM | POA: Diagnosis present

## 2019-10-25 DIAGNOSIS — I5032 Chronic diastolic (congestive) heart failure: Secondary | ICD-10-CM | POA: Diagnosis present

## 2019-10-25 DIAGNOSIS — D649 Anemia, unspecified: Secondary | ICD-10-CM | POA: Diagnosis present

## 2019-10-25 DIAGNOSIS — M5430 Sciatica, unspecified side: Secondary | ICD-10-CM | POA: Diagnosis present

## 2019-10-25 DIAGNOSIS — Z888 Allergy status to other drugs, medicaments and biological substances status: Secondary | ICD-10-CM | POA: Diagnosis not present

## 2019-10-25 DIAGNOSIS — Z952 Presence of prosthetic heart valve: Secondary | ICD-10-CM

## 2019-10-25 DIAGNOSIS — E7801 Familial hypercholesterolemia: Secondary | ICD-10-CM | POA: Diagnosis present

## 2019-10-25 DIAGNOSIS — I7300 Raynaud's syndrome without gangrene: Secondary | ICD-10-CM | POA: Diagnosis present

## 2019-10-25 DIAGNOSIS — H8110 Benign paroxysmal vertigo, unspecified ear: Secondary | ICD-10-CM | POA: Diagnosis present

## 2019-10-25 DIAGNOSIS — Z79899 Other long term (current) drug therapy: Secondary | ICD-10-CM | POA: Diagnosis not present

## 2019-10-25 DIAGNOSIS — Z7952 Long term (current) use of systemic steroids: Secondary | ICD-10-CM | POA: Diagnosis not present

## 2019-10-25 DIAGNOSIS — R509 Fever, unspecified: Secondary | ICD-10-CM | POA: Diagnosis not present

## 2019-10-25 DIAGNOSIS — I350 Nonrheumatic aortic (valve) stenosis: Secondary | ICD-10-CM | POA: Diagnosis not present

## 2019-10-25 DIAGNOSIS — M8580 Other specified disorders of bone density and structure, unspecified site: Secondary | ICD-10-CM | POA: Diagnosis present

## 2019-10-25 DIAGNOSIS — I313 Pericardial effusion (noninflammatory): Secondary | ICD-10-CM | POA: Diagnosis present

## 2019-10-25 DIAGNOSIS — M5417 Radiculopathy, lumbosacral region: Secondary | ICD-10-CM | POA: Diagnosis present

## 2019-10-25 DIAGNOSIS — E039 Hypothyroidism, unspecified: Secondary | ICD-10-CM | POA: Diagnosis present

## 2019-10-25 DIAGNOSIS — N1831 Chronic kidney disease, stage 3a: Secondary | ICD-10-CM | POA: Diagnosis present

## 2019-10-25 DIAGNOSIS — D61818 Other pancytopenia: Secondary | ICD-10-CM | POA: Diagnosis present

## 2019-10-25 DIAGNOSIS — I10 Essential (primary) hypertension: Secondary | ICD-10-CM | POA: Diagnosis present

## 2019-10-25 HISTORY — PX: TEE WITHOUT CARDIOVERSION: SHX5443

## 2019-10-25 HISTORY — PX: TRANSCATHETER AORTIC VALVE REPLACEMENT, TRANSFEMORAL: SHX6400

## 2019-10-25 HISTORY — DX: Presence of prosthetic heart valve: Z95.2

## 2019-10-25 LAB — POCT I-STAT, CHEM 8
BUN: 15 mg/dL (ref 8–23)
BUN: 14 mg/dL (ref 8–23)
BUN: 14 mg/dL (ref 8–23)
Calcium, Ion: 1.26 mmol/L (ref 1.15–1.40)
Calcium, Ion: 1.24 mmol/L (ref 1.15–1.40)
Calcium, Ion: 1.17 mmol/L (ref 1.15–1.40)
Chloride: 102 mmol/L (ref 98–111)
Chloride: 103 mmol/L (ref 98–111)
Chloride: 104 mmol/L (ref 98–111)
Creatinine, Ser: 1 mg/dL (ref 0.44–1.00)
Creatinine, Ser: 0.9 mg/dL (ref 0.44–1.00)
Creatinine, Ser: 0.9 mg/dL (ref 0.44–1.00)
Glucose, Bld: 95 mg/dL (ref 70–99)
Glucose, Bld: 94 mg/dL (ref 70–99)
Glucose, Bld: 95 mg/dL (ref 70–99)
HCT: 30 % — ABNORMAL LOW (ref 36.0–46.0)
HCT: 25 % — ABNORMAL LOW (ref 36.0–46.0)
HCT: 26 % — ABNORMAL LOW (ref 36.0–46.0)
Hemoglobin: 10.2 g/dL — ABNORMAL LOW (ref 12.0–15.0)
Hemoglobin: 8.5 g/dL — ABNORMAL LOW (ref 12.0–15.0)
Hemoglobin: 8.8 g/dL — ABNORMAL LOW (ref 12.0–15.0)
Potassium: 3.8 mmol/L (ref 3.5–5.1)
Potassium: 3.9 mmol/L (ref 3.5–5.1)
Potassium: 4 mmol/L (ref 3.5–5.1)
Sodium: 142 mmol/L (ref 135–145)
Sodium: 141 mmol/L (ref 135–145)
Sodium: 144 mmol/L (ref 135–145)
TCO2: 28 mmol/L (ref 22–32)
TCO2: 30 mmol/L (ref 22–32)
TCO2: 27 mmol/L (ref 22–32)

## 2019-10-25 LAB — POCT I-STAT 7, (LYTES, BLD GAS, ICA,H+H)
Acid-Base Excess: 4 mmol/L — ABNORMAL HIGH (ref 0.0–2.0)
Bicarbonate: 29.3 mmol/L — ABNORMAL HIGH (ref 20.0–28.0)
Calcium, Ion: 1.22 mmol/L (ref 1.15–1.40)
HCT: 32 % — ABNORMAL LOW (ref 36.0–46.0)
Hemoglobin: 10.9 g/dL — ABNORMAL LOW (ref 12.0–15.0)
O2 Saturation: 100 %
Potassium: 3.8 mmol/L (ref 3.5–5.1)
Sodium: 142 mmol/L (ref 135–145)
TCO2: 31 mmol/L (ref 22–32)
pCO2 arterial: 45.4 mmHg (ref 32.0–48.0)
pH, Arterial: 7.418 (ref 7.350–7.450)
pO2, Arterial: 204 mmHg — ABNORMAL HIGH (ref 83.0–108.0)

## 2019-10-25 SURGERY — IMPLANTATION, AORTIC VALVE, TRANSCATHETER, FEMORAL APPROACH
Anesthesia: Monitor Anesthesia Care | Site: Chest

## 2019-10-25 MED ORDER — PROPOFOL 10 MG/ML IV BOLUS
INTRAVENOUS | Status: AC
  Filled 2019-10-25: qty 20

## 2019-10-25 MED ORDER — CHLORHEXIDINE GLUCONATE 4 % EX LIQD: 60.0000 mL | Freq: Once | CUTANEOUS | Status: DC

## 2019-10-25 MED ORDER — CHLORHEXIDINE GLUCONATE 0.12 % MT SOLN: 15.0000 mL | Freq: Once | OROMUCOSAL | Status: DC

## 2019-10-25 MED ORDER — MORPHINE SULFATE (PF) 2 MG/ML IV SOLN: 1.0000 mg | INTRAVENOUS | Status: DC | PRN

## 2019-10-25 MED ORDER — SODIUM CHLORIDE (PF) 0.9 % IJ SOLN
INTRAMUSCULAR | Status: AC
  Filled 2019-10-25: qty 20

## 2019-10-25 MED ORDER — PRAVASTATIN SODIUM 10 MG PO TABS
20.0000 mg | ORAL_TABLET | Freq: Every day | ORAL | Status: DC
  Filled 2019-10-25: qty 2

## 2019-10-25 MED ORDER — SODIUM CHLORIDE 0.9 % IV SOLN: 250.0000 mL | INTRAVENOUS | Status: DC | PRN

## 2019-10-25 MED ORDER — OXYCODONE HCL 5 MG/5ML PO SOLN: 5.0000 mg | Freq: Once | ORAL | Status: DC | PRN

## 2019-10-25 MED ORDER — FENTANYL CITRATE (PF) 100 MCG/2ML IJ SOLN: 25.0000 ug | INTRAMUSCULAR | Status: DC | PRN

## 2019-10-25 MED ORDER — PROPOFOL 10 MG/ML IV BOLUS
INTRAVENOUS | Status: DC | PRN
  Administered 2019-10-25 (×2): 20 mg via INTRAVENOUS
  Administered 2019-10-25: 15 mg via INTRAVENOUS

## 2019-10-25 MED ORDER — ONDANSETRON HCL 4 MG/2ML IJ SOLN: 4.0000 mg | Freq: Once | INTRAMUSCULAR | Status: DC | PRN

## 2019-10-25 MED ORDER — TRAMADOL HCL 50 MG PO TABS
50.0000 mg | ORAL_TABLET | ORAL | Status: DC | PRN
  Administered 2019-10-25: 50 mg via ORAL
  Filled 2019-10-25: qty 1

## 2019-10-25 MED ORDER — ONDANSETRON HCL 4 MG/2ML IJ SOLN: 4.0000 mg | Freq: Four times a day (QID) | INTRAMUSCULAR | Status: DC | PRN

## 2019-10-25 MED ORDER — CHLORHEXIDINE GLUCONATE 4 % EX LIQD: 30.0000 mL | CUTANEOUS | Status: DC

## 2019-10-25 MED ORDER — OXYCODONE HCL 5 MG PO TABS: 5.0000 mg | ORAL_TABLET | ORAL | Status: DC | PRN

## 2019-10-25 MED ORDER — PHENYLEPHRINE HCL-NACL 20-0.9 MG/250ML-% IV SOLN
0.0000 ug/min | INTRAVENOUS | Status: DC
  Filled 2019-10-25: qty 250

## 2019-10-25 MED ORDER — SODIUM CHLORIDE 0.9 % IV SOLN
INTRAVENOUS | Status: DC | PRN
  Administered 2019-10-25: 1500 mL

## 2019-10-25 MED ORDER — LEVOTHYROXINE SODIUM 50 MCG PO TABS
50.0000 ug | ORAL_TABLET | Freq: Every day | ORAL | Status: DC
  Administered 2019-10-25: 50 ug via ORAL
  Filled 2019-10-25: qty 1

## 2019-10-25 MED ORDER — FENTANYL CITRATE (PF) 250 MCG/5ML IJ SOLN
INTRAMUSCULAR | Status: AC
  Filled 2019-10-25: qty 5

## 2019-10-25 MED ORDER — NITROGLYCERIN IN D5W 200-5 MCG/ML-% IV SOLN: 0.0000 ug/min | INTRAVENOUS | Status: DC

## 2019-10-25 MED ORDER — SODIUM CHLORIDE 0.9% FLUSH: 3.0000 mL | INTRAVENOUS | Status: DC | PRN

## 2019-10-25 MED ORDER — LIDOCAINE HCL 1 % IJ SOLN
INTRAMUSCULAR | Status: DC | PRN
  Administered 2019-10-25: 20 mL

## 2019-10-25 MED ORDER — SODIUM CHLORIDE 0.9 % IV SOLN
1.5000 g | Freq: Two times a day (BID) | INTRAVENOUS | Status: DC
  Administered 2019-10-25: 1.5 g via INTRAVENOUS
  Filled 2019-10-25 (×2): qty 1.5

## 2019-10-25 MED ORDER — VANCOMYCIN HCL IN DEXTROSE 1-5 GM/200ML-% IV SOLN
500.0000 mg | Freq: Once | INTRAVENOUS | Status: AC
  Administered 2019-10-26: 500 mg via INTRAVENOUS
  Filled 2019-10-25: qty 200

## 2019-10-25 MED ORDER — SODIUM CHLORIDE 0.9 % IV SOLN
INTRAVENOUS | Status: AC
  Filled 2019-10-25 (×3): qty 1.2

## 2019-10-25 MED ORDER — LACTATED RINGERS IV SOLN: INTRAVENOUS | Status: DC

## 2019-10-25 MED ORDER — LEVOTHYROXINE SODIUM 50 MCG PO TABS
50.0000 ug | ORAL_TABLET | Freq: Every day | ORAL | Status: DC
  Filled 2019-10-25: qty 1

## 2019-10-25 MED ORDER — ACETAMINOPHEN 325 MG PO TABS
650.0000 mg | ORAL_TABLET | Freq: Four times a day (QID) | ORAL | Status: DC | PRN
  Administered 2019-10-25 – 2019-10-26 (×2): 650 mg via ORAL
  Filled 2019-10-25 (×2): qty 2

## 2019-10-25 MED ORDER — CHLORHEXIDINE GLUCONATE 0.12 % MT SOLN
OROMUCOSAL | Status: AC
  Filled 2019-10-25: qty 15

## 2019-10-25 MED ORDER — HYDROXYCHLOROQUINE SULFATE 200 MG PO TABS
200.0000 mg | ORAL_TABLET | Freq: Every day | ORAL | Status: DC
  Administered 2019-10-26: 200 mg via ORAL
  Filled 2019-10-25 (×2): qty 1

## 2019-10-25 MED ORDER — 0.9 % SODIUM CHLORIDE (POUR BTL) OPTIME
TOPICAL | Status: DC | PRN
  Administered 2019-10-25: 1000 mL

## 2019-10-25 MED ORDER — CLOPIDOGREL BISULFATE 75 MG PO TABS: 75.0000 mg | ORAL_TABLET | Freq: Every day | ORAL | Status: DC

## 2019-10-25 MED ORDER — OLOPATADINE HCL 0.1 % OP SOLN
1.0000 [drp] | Freq: Two times a day (BID) | OPHTHALMIC | Status: DC
  Administered 2019-10-26: 1 [drp] via OPHTHALMIC
  Filled 2019-10-25: qty 5

## 2019-10-25 MED ORDER — PREDNISONE 5 MG PO TABS
5.0000 mg | ORAL_TABLET | Freq: Every day | ORAL | Status: DC
  Administered 2019-10-26: 5 mg via ORAL
  Filled 2019-10-25 (×2): qty 1

## 2019-10-25 MED ORDER — IODIXANOL 320 MG/ML IV SOLN
INTRAVENOUS | Status: DC | PRN
  Administered 2019-10-25: 60 mL

## 2019-10-25 MED ORDER — HEPARIN SODIUM (PORCINE) 1000 UNIT/ML IJ SOLN
INTRAMUSCULAR | Status: DC | PRN
Start: 2019-10-25 — End: 2019-10-25
  Administered 2019-10-25: 8000 [IU] via INTRAVENOUS

## 2019-10-25 MED ORDER — FENTANYL CITRATE (PF) 250 MCG/5ML IJ SOLN
INTRAMUSCULAR | Status: DC | PRN
  Administered 2019-10-25: 25 ug via INTRAVENOUS

## 2019-10-25 MED ORDER — OXYCODONE HCL 5 MG PO TABS: 5.0000 mg | ORAL_TABLET | Freq: Once | ORAL | Status: DC | PRN

## 2019-10-25 MED ORDER — PROTAMINE SULFATE 10 MG/ML IV SOLN
INTRAVENOUS | Status: AC
  Filled 2019-10-25: qty 25

## 2019-10-25 MED ORDER — ACETAMINOPHEN 650 MG RE SUPP: 650.0000 mg | Freq: Four times a day (QID) | RECTAL | Status: DC | PRN

## 2019-10-25 MED ORDER — ASPIRIN 81 MG PO CHEW
81.0000 mg | CHEWABLE_TABLET | Freq: Every day | ORAL | Status: DC
  Administered 2019-10-26: 81 mg via ORAL
  Filled 2019-10-25: qty 1

## 2019-10-25 MED ORDER — SODIUM CHLORIDE 0.9 % IV SOLN: INTRAVENOUS | Status: DC

## 2019-10-25 MED ORDER — SODIUM CHLORIDE 0.9 % IV SOLN: INTRAVENOUS | Status: AC

## 2019-10-25 MED ORDER — HEPARIN SODIUM (PORCINE) 1000 UNIT/ML IJ SOLN
INTRAMUSCULAR | Status: AC
  Filled 2019-10-25: qty 1

## 2019-10-25 MED ORDER — PROPOFOL 500 MG/50ML IV EMUL
INTRAVENOUS | Status: DC | PRN
  Administered 2019-10-25: 15 ug/kg/min via INTRAVENOUS

## 2019-10-25 MED ORDER — LIDOCAINE HCL 1 % IJ SOLN
INTRAMUSCULAR | Status: AC
  Filled 2019-10-25: qty 20

## 2019-10-25 MED ORDER — SODIUM CHLORIDE 0.9% FLUSH
3.0000 mL | Freq: Two times a day (BID) | INTRAVENOUS | Status: DC
  Administered 2019-10-26: 3 mL via INTRAVENOUS

## 2019-10-25 MED ORDER — PROTAMINE SULFATE 10 MG/ML IV SOLN
INTRAVENOUS | Status: DC | PRN
Start: 2019-10-25 — End: 2019-10-25
  Administered 2019-10-25: 80 mg via INTRAVENOUS

## 2019-10-25 SURGICAL SUPPLY — 83 items
ADH SKN CLS APL DERMABOND .7 (GAUZE/BANDAGES/DRESSINGS) ×2
APL PRP STRL LF DISP 70% ISPRP (MISCELLANEOUS) ×2
BAG DECANTER FOR FLEXI CONT (MISCELLANEOUS) ×1 IMPLANT
BAG SNAP BAND KOVER 36X36 (MISCELLANEOUS) ×3 IMPLANT
BLADE CLIPPER SURG (BLADE) IMPLANT
BLADE OSCILLATING /SAGITTAL (BLADE) IMPLANT
BLADE STERNUM SYSTEM 6 (BLADE) IMPLANT
BLADE SURG 10 STRL SS (BLADE) IMPLANT
CABLE ADAPT CONN TEMP 6FT (ADAPTER) ×3 IMPLANT
CATH DIAG EXPO 6F AL1 (CATHETERS) ×1 IMPLANT
CATH DIAG EXPO 6F VENT PIG 145 (CATHETERS) ×6 IMPLANT
CATH EXTERNAL FEMALE PUREWICK (CATHETERS) IMPLANT
CATH INFINITI 6F AL2 (CATHETERS) IMPLANT
CATH S G BIP PACING (CATHETERS) ×3 IMPLANT
CHLORAPREP W/TINT 26 (MISCELLANEOUS) ×3 IMPLANT
CLIP VESOCCLUDE MED 24/CT (CLIP) IMPLANT
CLIP VESOCCLUDE SM WIDE 24/CT (CLIP) IMPLANT
CNTNR URN SCR LID CUP LEK RST (MISCELLANEOUS) ×4 IMPLANT
CONT SPEC 4OZ STRL OR WHT (MISCELLANEOUS) ×6
COVER BACK TABLE 80X110 HD (DRAPES) ×3 IMPLANT
COVER WAND RF STERILE (DRAPES) ×2 IMPLANT
DECANTER SPIKE VIAL GLASS SM (MISCELLANEOUS) ×3 IMPLANT
DERMABOND ADVANCED (GAUZE/BANDAGES/DRESSINGS) ×1
DERMABOND ADVANCED .7 DNX12 (GAUZE/BANDAGES/DRESSINGS) ×2 IMPLANT
DEVICE CLOSURE PERCLS PRGLD 6F (VASCULAR PRODUCTS) ×4 IMPLANT
DRAPE INCISE IOBAN 66X45 STRL (DRAPES) IMPLANT
DRSG TEGADERM 4X4.75 (GAUZE/BANDAGES/DRESSINGS) ×6 IMPLANT
ELECT CAUTERY BLADE 6.4 (BLADE) IMPLANT
ELECT REM PT RETURN 9FT ADLT (ELECTROSURGICAL) ×3
ELECTRODE REM PT RTRN 9FT ADLT (ELECTROSURGICAL) ×4 IMPLANT
FELT TEFLON 6X6 (MISCELLANEOUS) IMPLANT
GAUZE SPONGE 4X4 12PLY STRL (GAUZE/BANDAGES/DRESSINGS) ×3 IMPLANT
GLOVE BIO SURGEON STRL SZ7.5 (GLOVE) ×3 IMPLANT
GLOVE BIO SURGEON STRL SZ8 (GLOVE) IMPLANT
GLOVE EUDERMIC 7 POWDERFREE (GLOVE) IMPLANT
GLOVE ORTHO TXT STRL SZ7.5 (GLOVE) ×1 IMPLANT
GOWN STRL REUS W/ TWL LRG LVL3 (GOWN DISPOSABLE) IMPLANT
GOWN STRL REUS W/ TWL XL LVL3 (GOWN DISPOSABLE) ×2 IMPLANT
GOWN STRL REUS W/TWL LRG LVL3 (GOWN DISPOSABLE) ×3
GOWN STRL REUS W/TWL XL LVL3 (GOWN DISPOSABLE) ×12
GUIDEWIRE SAFE TJ AMPLATZ EXST (WIRE) ×3 IMPLANT
INSERT FOGARTY SM (MISCELLANEOUS) IMPLANT
KIT BASIN OR (CUSTOM PROCEDURE TRAY) ×3 IMPLANT
KIT HEART LEFT (KITS) ×3 IMPLANT
KIT SUCTION CATH 14FR (SUCTIONS) IMPLANT
KIT TURNOVER KIT B (KITS) ×3 IMPLANT
LOOP VESSEL MAXI BLUE (MISCELLANEOUS) IMPLANT
LOOP VESSEL MINI RED (MISCELLANEOUS) IMPLANT
NS IRRIG 1000ML POUR BTL (IV SOLUTION) ×3 IMPLANT
PACK ENDO MINOR (CUSTOM PROCEDURE TRAY) ×3 IMPLANT
PAD ARMBOARD 7.5X6 YLW CONV (MISCELLANEOUS) ×6 IMPLANT
PAD ELECT DEFIB RADIOL ZOLL (MISCELLANEOUS) ×3 IMPLANT
PENCIL BUTTON HOLSTER BLD 10FT (ELECTRODE) IMPLANT
PERCLOSE PROGLIDE 6F (VASCULAR PRODUCTS) ×6
POSITIONER HEAD DONUT 9IN (MISCELLANEOUS) ×3 IMPLANT
SET MICROPUNCTURE 5F STIFF (MISCELLANEOUS) ×3 IMPLANT
SHEATH BRITE TIP 7FR 35CM (SHEATH) ×3 IMPLANT
SHEATH PINNACLE 5F 10CM (SHEATH) ×1 IMPLANT
SHEATH PINNACLE 6F 10CM (SHEATH) ×3 IMPLANT
SHEATH PINNACLE 8F 10CM (SHEATH) ×3 IMPLANT
SLEEVE REPOSITIONING LENGTH 30 (MISCELLANEOUS) ×3 IMPLANT
STOPCOCK MORSE 400PSI 3WAY (MISCELLANEOUS) ×6 IMPLANT
SUT ETHIBOND X763 2 0 SH 1 (SUTURE) IMPLANT
SUT GORETEX CV 4 TH 22 36 (SUTURE) IMPLANT
SUT GORETEX CV4 TH-18 (SUTURE) IMPLANT
SUT MNCRL AB 3-0 PS2 18 (SUTURE) IMPLANT
SUT PROLENE 5 0 C 1 36 (SUTURE) IMPLANT
SUT PROLENE 6 0 C 1 30 (SUTURE) IMPLANT
SUT SILK  1 MH (SUTURE) ×3
SUT SILK 1 MH (SUTURE) ×2 IMPLANT
SUT VIC AB 2-0 CT1 27 (SUTURE)
SUT VIC AB 2-0 CT1 TAPERPNT 27 (SUTURE) IMPLANT
SUT VIC AB 2-0 CTX 36 (SUTURE) IMPLANT
SUT VIC AB 3-0 SH 8-18 (SUTURE) IMPLANT
SYR 50ML LL SCALE MARK (SYRINGE) ×3 IMPLANT
SYR BULB IRRIG 60ML STRL (SYRINGE) IMPLANT
SYR MEDRAD MARK V 150ML (SYRINGE) ×3 IMPLANT
TOWEL GREEN STERILE (TOWEL DISPOSABLE) ×6 IMPLANT
TRANSDUCER W/STOPCOCK (MISCELLANEOUS) ×6 IMPLANT
TRAY FOLEY SLVR 16FR TEMP STAT (SET/KITS/TRAYS/PACK) IMPLANT
VALVE 26 ULTRA SAPIEN KIT (Valve) ×1 IMPLANT
WIRE EMERALD 3MM-J .035X150CM (WIRE) ×3 IMPLANT
WIRE EMERALD 3MM-J .035X260CM (WIRE) ×3 IMPLANT

## 2019-10-25 NOTE — Interval H&P Note (Signed)
History and Physical Interval Note:  10/25/2019 9:44 AM  Carmen Blackwell  has presented today for surgery, with the diagnosis of Severe Aortic Stenosis.  The various methods of treatment have been discussed with the patient and family. After consideration of risks, benefits and other options for treatment, the patient has consented to  Procedure(s): TRANSCATHETER AORTIC VALVE REPLACEMENT, TRANSFEMORAL (N/A) TRANSESOPHAGEAL ECHOCARDIOGRAM (TEE) (N/A) as a surgical intervention.  The patient's history has been reviewed, patient examined, no change in status, stable for surgery.  I have reviewed the patient's chart and labs.  Questions were answered to the patient's satisfaction.     Rexene Alberts

## 2019-10-25 NOTE — Telephone Encounter (Signed)
Thank you :)

## 2019-10-25 NOTE — Progress Notes (Signed)
  [facility] VALVE TEAM  Patient doing well s/p TAVR. She is hemodynamically stable. Groin sites stable. ECG with sinus brady and new 1st deg AV block. No high grade block. Arterial line discontinued and plan for transfer to 4E. Plan for early ambulation after bedrest completed and hopeful discharge over the next 24-48 hours.   Angelena Form PA-C  [facility]  Pager [phone]

## 2019-10-25 NOTE — Anesthesia Postprocedure Evaluation (Signed)
Anesthesia Post Note  Patient: Carmen Blackwell  Procedure(s) Performed: TRANSCATHETER AORTIC VALVE REPLACEMENT, TRANSFEMORAL (N/A Chest) TRANSESOPHAGEAL ECHOCARDIOGRAM (TEE) (N/A )     Patient location during evaluation: PACU Anesthesia Type: MAC Level of consciousness: awake and alert Pain management: pain level controlled Vital Signs Assessment: post-procedure vital signs reviewed and stable Respiratory status: spontaneous breathing, nonlabored ventilation and respiratory function stable Cardiovascular status: stable and blood pressure returned to baseline Anesthetic complications: no   No complications documented.  Last Vitals:  Vitals:   10/25/19 1410 10/25/19 1440  BP: (!) 110/48 (!) 100/50  Pulse: (!) 49 (!) 52  Resp: 12 (!) 9  Temp: 36.4 C   SpO2: 100% 99%    Last Pain:  Vitals:   10/25/19 1410  TempSrc: Temporal  PainSc:                  Audry Pili

## 2019-10-25 NOTE — Progress Notes (Addendum)
   10/25/19 2139  Vitals  Temp (!) 101.3 F (38.5 C) (tylenol given)  Temp Source Oral  BP 117/62  MAP (mmHg) 79  BP Location Left Arm  BP Method Automatic  Patient Position (if appropriate) Lying  Pulse Rate Source Monitor  ECG Heart Rate 83  Resp 19  Oxygen Therapy  SpO2 100 %  O2 Device Room Air   Pt asked for temp to be taken as she felt feverish. Gave tylenol PRN per order. Room heat was set to 78 degrees and pt had many blankets on her- I removed most of them and turned heat down. Encouraged fluid intake. Groin sites unchanged, L groin w/ small hematoma and small amount of old serosanguineous drainage on dressing. No redness or tenderness. R groin dressing CDI, level 0.  @2300 : oral temp 100.4. Vitals stable

## 2019-10-25 NOTE — CV Procedure (Signed)
[facility]  TAVR OPERATIVE NOTE   Date of Procedure:  10/25/2019  Preoperative Diagnosis: Severe Aortic Stenosis   Postoperative Diagnosis: Same   Procedure:    Transcatheter Aortic Valve Replacement - Transfemoral Approach  Edwards Sapien 3 THV (size 26 mm, model # [id number], serial # [id number])   Co-Surgeons:  Lauree Chandler, MD and Valentina Gu. Roxy Manns, MD   Anesthesiologist:  Fransisco Beau  Echocardiographer:  Croitoru  Pre-operative Echo Findings:  Severe aortic stenosis  Normal left ventricular systolic function  Post-operative Echo Findings:  No paravalvular leak  Normal left ventricular systolic function  BRIEF CLINICAL NOTE AND INDICATIONS FOR SURGERY  86 yo female with history of hypothyroidism, vertigo, CKD stage 3, GERD, HLD, MALT lymphoma, pancytopenia, Sjogren's syndrome and critical aortic stenosis who is here today for TAVR. Echo 09/08/19 with LVEF=55-60%. Trivial MR. The aortic valve is heavily thickened and calcified with critical stenosis. Mean gradient 61 mmHg, peak gradient 88.4 mmHg, AVA 0.6 cm2, dimensionless index 0.19. Cardiac cath 10/12/19 with mild non-obstructive CAD. Normal right heart pressures. She has been on chronic low dose steroids.   During the course of the patient's preoperative work up they have been evaluated comprehensively by a multidisciplinary team of specialists coordinated through the [facility] in the [facility].  They have been demonstrated to suffer from symptomatic severe aortic stenosis as noted above. The patient has been counseled extensively as to the relative risks and benefits of all options for the treatment of severe aortic stenosis including long term medical therapy, conventional surgery for aortic valve replacement, and transcatheter aortic valve replacement.  The patient has been independently evaluated by Dr. Roxy Manns with CT surgery and they are felt to be at  high risk for conventional surgical aortic valve replacement. The surgeon indicated the patient would be a poor candidate for conventional surgery. Based upon review of all of the patient's preoperative diagnostic tests they are felt to be candidate for transcatheter aortic valve replacement using the transfemoral approach as an alternative to high risk conventional surgery.    Following the decision to proceed with transcatheter aortic valve replacement, a discussion has been held regarding what types of management strategies would be attempted intraoperatively in the event of life-threatening complications, including whether or not the patient would be considered a candidate for the use of cardiopulmonary bypass and/or conversion to open sternotomy for attempted surgical intervention.  The patient has been advised of a variety of complications that might develop peculiar to this approach including but not limited to risks of death, stroke, paravalvular leak, aortic dissection or other major vascular complications, aortic annulus rupture, device embolization, cardiac rupture or perforation, acute myocardial infarction, arrhythmia, heart block or bradycardia requiring permanent pacemaker placement, congestive heart failure, respiratory failure, renal failure, pneumonia, infection, other late complications related to structural valve deterioration or migration, or other complications that might ultimately cause a temporary or permanent loss of functional independence or other long term morbidity.  The patient provides full informed consent for the procedure as described and all questions were answered preoperatively.    DETAILS OF THE OPERATIVE PROCEDURE  PREPARATION:   The patient is brought to the operating room on the above mentioned date and central monitoring was established by the anesthesia team including placement of a radial arterial line. The patient is placed in the supine position on the  operating table.  Intravenous antibiotics are administered. Conscious sedation is used.   Baseline transthoracic echocardiogram was performed.  The patient's chest, abdomen, both groins, and both lower extremities are prepared and draped in a sterile manner. A time out procedure is performed.   PERIPHERAL ACCESS:   Using the modified Seldinger technique, femoral arterial access was obtained with placement of a 6 Fr sheaths on the left side using u/s guidance. A pigtail diagnostic catheter was passed through the femoral arterial sheath under fluoroscopic guidance into the aortic root.  Access obtained in the right femoral vein using u/s guidance with a 6 Fr sheath. A temporary transvenous pacemaker catheter was passed through the femoral venous sheath under fluoroscopic guidance into the right ventricle.  The pacemaker was tested to ensure stable lead placement and pacemaker capture. Aortic root angiography was performed in order to determine the optimal angiographic angle for valve deployment.  TRANSFEMORAL ACCESS:  A micropuncture kit was used to gain access to the right femoral artery. Position confirmed with angiography. Pre-closure with double ProGlide closure devices. The patient was heparinized systemically and ACT verified > 250 seconds.    A 14 Fr transfemoral E-sheath was introduced into the right femoral artery after progressively dilating over an Amplatz superstiff wire. An AL-2 catheter was used to direct a straight-tip exchange length wire across the native aortic valve into the left ventricle. This was exchanged out for a pigtail catheter and position was confirmed in the LV apex. Simultaneous LV and Ao pressures were recorded.  The pigtail catheter was then exchanged for an Amplatz Extra-stiff wire in the LV apex.   TRANSCATHETER HEART VALVE DEPLOYMENT:  An Edwards Sapien 3 THV (size 26 mm) was prepared and crimped per manufacturer's guidelines, and the proper orientation of the valve is  confirmed on the [employer] delivery system. The valve was advanced through the introducer sheath using normal technique until in an appropriate position in the abdominal aorta beyond the sheath tip. The balloon was then retracted and using the fine-tuning wheel was centered on the valve. The valve was then advanced across the aortic arch using appropriate flexion of the catheter. The valve was carefully positioned across the aortic valve annulus. The Commander catheter was retracted using normal technique. Once final position of the valve has been confirmed by angiographic assessment, the valve is deployed while temporarily holding ventilation and during rapid ventricular pacing to maintain systolic blood pressure < 50 mmHg and pulse pressure < 10 mmHg. The balloon inflation is held for >3 seconds after reaching full deployment volume. Once the balloon has fully deflated the balloon is retracted into the ascending aorta and valve function is assessed using TTE. There is felt to be no paravalvular leak and no central aortic insufficiency.  The patient's hemodynamic recovery following valve deployment is good.  The deployment balloon and guidewire are both removed. Echo demostrated acceptable post-procedural gradients, stable mitral valve function, and no AI.   PROCEDURE COMPLETION:  The sheath was then removed and closure devices were completed. Protamine was administered once femoral arterial repair was complete. The temporary pacemaker, pigtail catheters and femoral sheaths were removed with manual pressure used for hemostasis.    The patient tolerated the procedure well and is transported to the surgical intensive care in stable condition. There were no immediate intraoperative complications. All sponge instrument and needle counts are verified correct at completion of the operation.   No blood products were administered during the operation.  The patient received a total of 60 mL of intravenous  contrast during the procedure.  Lauree Chandler MD 10/25/2019 1:14 PM

## 2019-10-25 NOTE — Op Note (Signed)
[facility]   MULTIDISCIPLINARY HEART VALVE TEAM   TAVR OPERATIVE NOTE   Date of Procedure:  10/25/2019  Preoperative Diagnosis: Severe Aortic Stenosis   Postoperative Diagnosis: Same   Procedure:    Transcatheter Aortic Valve Replacement - Percutaneous Right Transfemoral Approach  Edwards Sapien 3 Ultra THV (size 26 mm, model # 9750TFX, serial # [id number])   Co-Surgeons:  Valentina Gu. Roxy Manns, MD and Lauree Chandler, MD  Anesthesiologist:  Renold Don, MD  Echocardiographer:  Sanda Klein, MD  Pre-operative Echo Findings:  Severe aortic stenosis  Normal left ventricular systolic function  Post-operative Echo Findings:  No paravalvular leak  Normal left ventricular systolic function   BRIEF CLINICAL NOTE AND INDICATIONS FOR SURGERY  Patient is an 86 year old female with history of aortic stenosis, hypertension, hyperlipidemia, Sjogren's syndrome on chronic prednisone and Plaquenil, and remote history of MALT lymphoma in remission who has been referred for surgical consultation to discuss treatment options for management of severe symptomatic aortic stenosis.  Patient states that she was first noted to have a heart murmur on physical exam 4-5 years ago.  Echocardiogram revealed findings consistent with moderate aortic stenosis with preserved left ventricular systolic function and she has been followed ever since by Dr. Bettina Gavia.  Recent follow-up echocardiogram revealed significant progression of disease with peak velocity across aortic valve measured 4.7 m/s corresponding to mean transvalvular gradient estimated 61 mmHg and aortic valve area calculated only 0.60 cm with DVI reported only 0.19.  Left ventricular systolic function remains preserved with ejection fraction estimated 55 to 60%.  The patient was referred to the multidisciplinary heart valve clinic and has been evaluated previously by Dr. Angelena Form.  Diagnostic cardiac catheterization performed October 12, 2019 revealed mild nonobstructive coronary artery disease.  The aortic valve was not crossed to measure gradient.  Right-sided pressures were normal.  CT angiography was performed and the patient was referred for surgical consultation.  During the course of the patient's preoperative work up they have been evaluated comprehensively by a multidisciplinary team of specialists coordinated through the [facility] in the [facility].  They have been demonstrated to suffer from symptomatic severe aortic stenosis as noted above. The patient has been counseled extensively as to the relative risks and benefits of all options for the treatment of severe aortic stenosis including long term medical therapy, conventional surgery for aortic valve replacement, and transcatheter aortic valve replacement.  All questions have been answered, and the patient provides full informed consent for the operation as described.   DETAILS OF THE OPERATIVE PROCEDURE  PREPARATION:    The patient is brought to the operating room on the above mentioned date and appropriate monitoring was established by the anesthesia team. The patient is placed in the supine position on the operating table.  Intravenous antibiotics are administered. The patient is monitored closely throughout the procedure under conscious sedation.  Baseline transthoracic echocardiogram was performed. The patient's chest, abdomen, both groins, and both lower extremities are prepared and draped in a sterile manner. A time out procedure is performed.   PERIPHERAL ACCESS:    Using the modified Seldinger technique, femoral arterial and venous access was obtained with placement of 6 Fr sheaths on the left side.  A pigtail diagnostic catheter was passed through the left arterial sheath under fluoroscopic guidance into the aortic root.  A temporary transvenous pacemaker catheter was passed through the right femoral  venous sheath under fluoroscopic guidance into the right ventricle.  The pacemaker was tested to ensure stable lead placement and pacemaker capture. Aortic root angiography was performed in order to determine the optimal angiographic angle for valve deployment.   TRANSFEMORAL ACCESS:   Percutaneous transfemoral access and sheath placement was performed using ultrasound guidance.  The right common femoral artery was cannulated using a micropuncture needle and appropriate location was verified using hand injection angiogram.  A pair of Abbott Perclose percutaneous closure devices were placed and a 6 French sheath replaced into the femoral artery.  The patient was heparinized systemically and ACT verified > 250 seconds.    A 14 Fr transfemoral E-sheath was introduced into the right common femoral artery after progressively dilating over an Amplatz superstiff wire. An AL-2 catheter was used to direct a straight-tip exchange length wire across the native aortic valve into the left ventricle. This was exchanged out for a pigtail catheter and position was confirmed in the LV apex. Simultaneous LV and Ao pressures were recorded.  The pigtail catheter was exchanged for an Amplatz Extra-stiff wire in the LV apex.  Echocardiography was utilized to confirm appropriate wire position and no sign of entanglement in the mitral subvalvular apparatus.   TRANSCATHETER HEART VALVE DEPLOYMENT:   An Edwards Sapien 3 Ultra transcatheter heart valve (size 26 mm, model #9750TFX, serial #[id number]) was prepared and crimped per manufacturer's guidelines, and the proper orientation of the valve is confirmed on the [employer] delivery system. The valve was advanced through the introducer sheath using normal technique until in an appropriate position in the abdominal aorta beyond the sheath tip. The balloon was then retracted and using the fine-tuning wheel was centered on the valve. The valve was then advanced across the aortic  arch using appropriate flexion of the catheter. The valve was carefully positioned across the aortic valve annulus. The Commander catheter was retracted using normal technique. Once final position of the valve has been confirmed by angiographic assessment, the valve is deployed while temporarily holding ventilation and during rapid ventricular pacing to maintain systolic blood pressure < 50 mmHg and pulse pressure < 10 mmHg. The balloon inflation is held for >3 seconds after reaching full deployment volume. Once the balloon has fully deflated the balloon is retracted into the ascending aorta and valve function is assessed using echocardiography. There is felt to be no paravalvular leak and no central aortic insufficiency.  The patient's hemodynamic recovery following valve deployment is good.  The deployment balloon and guidewire are both removed.    PROCEDURE COMPLETION:   The sheath was removed and femoral artery closure performed.  Protamine was administered once femoral arterial repair was complete. The temporary pacemaker, pigtail catheters and femoral sheaths were removed with manual pressure used for hemostasis.   The patient tolerated the procedure well and is transported to the surgical intensive care in stable condition. There were no immediate intraoperative complications. All sponge instrument and needle counts are verified correct at completion of the operation.   No blood products were administered during the operation.  The patient received a total of 60 mL of intravenous contrast during the procedure.   Rexene Alberts, MD 10/25/2019 1:10 PM

## 2019-10-25 NOTE — Telephone Encounter (Signed)
Patient is scheduled for an appt on 11/28/19 with Dr. Bettina Gavia per Bonney Leitz. Patient also needs an echo that same day. Could patient do the 1:15pm echo and see Dr. Bettina Gavia for her 2pm appt? Please advise me, it's not letting me schedule due to override access which I do not have. Please advise so I can reschedule patient.

## 2019-10-25 NOTE — Progress Notes (Signed)
*  PRELIMINARY RESULTS* Echocardiogram 2D Echocardiogram has been performed.  Carmen Blackwell 10/25/2019, 1:05 PM

## 2019-10-25 NOTE — Anesthesia Procedure Notes (Signed)
Arterial Line Insertion Start/End6/29/2021 9:39 AM, 10/25/2019 9:39 AM Performed by: Renato Shin, CRNA, CRNA  Patient location: Pre-op. Preanesthetic checklist: patient identified, IV checked, site marked, risks and benefits discussed, surgical consent, monitors and equipment checked, pre-op evaluation, timeout performed and anesthesia consent Lidocaine 1% used for infiltration Right, radial was placed Catheter size: 20 G Hand hygiene performed , maximum sterile barriers used  and Seldinger technique used Allen's test indicative of satisfactory collateral circulation Attempts: 1 Procedure performed without using ultrasound guided technique. Following insertion, dressing applied and Biopatch. Post procedure assessment: normal  Patient tolerated the procedure well with no immediate complications.

## 2019-10-25 NOTE — Discharge Instructions (Signed)
ACTIVITY AND EXERCISE °• Daily activity and exercise are an important part of your recovery. People recover at different rates depending on their general health and type of valve procedure. °• Most people recovering from TAVR feel better relatively quickly  °• No lifting, pushing, pulling more than 10 pounds (examples to avoid: groceries, vacuuming, gardening, golfing): °            - For one week with a procedure through the groin. °            - For six weeks for procedures through the chest wall or neck. °NOTE: You will typically see one of our providers 7-14 days after your procedure to discuss WHEN TO RESUME the above activities.  °  °  °DRIVING °• Do not drive until you are seen for follow up and cleared by a provider. Generally, we ask patient to not drive for 1 week after their procedure. °• If you have been told by your doctor in the past that you may not drive, you must talk with him/her before you begin driving again. °  °DRESSING °• Groin site: you may leave the clear dressing over the site for up to one week or until it falls off. °  °HYGIENE °• If you had a femoral (leg) procedure, you may take a shower when you return home. After the shower, pat the site dry. Do NOT use powder, oils or lotions in your groin area until the site has completely healed. °• If you had a chest procedure, you may shower when you return home unless specifically instructed not to by your discharging practitioner. °            - DO NOT scrub incision; pat dry with a towel. °            - DO NOT apply any lotions, oils, powders to the incision. °            - No tub baths / swimming for at least 2 weeks. °• If you notice any fevers, chills, increased pain, swelling, bleeding or pus, please contact your doctor. °  °ADDITIONAL INFORMATION °• If you are going to have an upcoming dental procedure, please contact our office as you will require antibiotics ahead of time to prevent infection on your heart valve.  ° ° °If you have any  questions or concerns you can call the structural heart phone during normal business hours 8am-4pm. If you have an urgent need after hours or weekends please call [phone] to talk to the on call provider for general cardiology. If you have an emergency that requires immediate attention, please call 911.  ° ° °After TAVR Checklist ° °Check  Test Description  ° Follow up appointment in 1-2 weeks  You will see our structural heart physician assistant, Katie Lesly Joslyn. Your incision sites will be checked and you will be cleared to drive and resume all normal activities if you are doing well.    ° 1 month echo and follow up  You will have an echo to check on your new heart valve and be seen back in the office by Katie Caroljean Monsivais. Many times the echo is not read by your appointment time, but Katie will call you later that day or the following day to report your results.  ° Follow up with your primary cardiologist You will need to be seen by your primary cardiologist in the following 3-6 months after your 1 month appointment in the valve   clinic. Often times your Plavix or Aspirin will be discontinued during this time, but this is decided on a case by case basis.   ° 1 year echo and follow up You will have another echo to check on your heart valve after 1 year and be seen back in the office by Katie Sarena Jezek. This your last structural heart visit.  ° Bacterial endocarditis prophylaxis  You will have to take antibiotics for the rest of your life before all dental procedures (even teeth cleanings) to protect your heart valve. Antibiotics are also required before some surgeries. Please check with your cardiologist before scheduling any surgeries. Also, please make sure to tell us if you have a penicillin allergy as you will require an alternative antibiotic.   ° ° °

## 2019-10-25 NOTE — Transfer of Care (Signed)
Immediate Anesthesia Transfer of Care Note  Patient: Carmen Blackwell  Procedure(s) Performed: TRANSCATHETER AORTIC VALVE REPLACEMENT, TRANSFEMORAL (N/A Chest) TRANSESOPHAGEAL ECHOCARDIOGRAM (TEE) (N/A )  Patient Location: PACU and Cath Lab  Anesthesia Type:MAC  Level of Consciousness: awake, alert , oriented and patient cooperative  Airway & Oxygen Therapy: Patient Spontanous Breathing  Post-op Assessment: Report given to RN and Post -op Vital signs reviewed and stable  Post vital signs: Reviewed and stable  Last Vitals:  Vitals Value Taken Time  BP 102/44 10/25/19 1333  Temp    Pulse 52 10/25/19 1334  Resp 0 10/25/19 1334  SpO2 98 % 10/25/19 1334  Vitals shown include unvalidated device data.  Last Pain:  Vitals:   10/25/19 0853  TempSrc: Temporal         Complications: No complications documented.

## 2019-10-25 NOTE — Progress Notes (Signed)
PHARMACY NOTE:  ANTIMICROBIAL RENAL DOSAGE ADJUSTMENT  Current antimicrobial regimen includes a mismatch between antimicrobial dosage and estimated renal function.  As per policy approved by the Pharmacy & Therapeutics and Medical Executive Committees, the antimicrobial dosage will be adjusted accordingly.  Current antimicrobial dosage:  Vancomycin 1gm IV x 1, 12 hrs post pre-op dose  Indication: surgical prophylaxis  Renal Function:  Estimated Creatinine Clearance: 31.6 mL/min (by C-G formula based on SCr of 0.9 mg/dL). []      On intermittent HD, scheduled: []      On CRRT    Antimicrobial dosage has been changed to:  vanc 500mg  IV x 1, 24 hrs post pre-op dose  Additional comments: elderly with reduced CrCL  Thank you for allowing pharmacy to be a part of this patient's care.   Janellie Tennison D. Mina Marble, PharmD, BCPS, [place] 10/25/2019, 3:43 PM

## 2019-10-26 ENCOUNTER — Inpatient Hospital Stay (HOSPITAL_COMMUNITY): Payer: Medicare HMO

## 2019-10-26 ENCOUNTER — Encounter (HOSPITAL_COMMUNITY): Payer: Self-pay | Admitting: Cardiovascular Disease

## 2019-10-26 DIAGNOSIS — Z952 Presence of prosthetic heart valve: Secondary | ICD-10-CM

## 2019-10-26 LAB — BASIC METABOLIC PANEL
Anion gap: 10 (ref 5–15)
BUN: 15 mg/dL (ref 8–23)
CO2: 25 mmol/L (ref 22–32)
Calcium: 8.4 mg/dL — ABNORMAL LOW (ref 8.9–10.3)
Chloride: 105 mmol/L (ref 98–111)
Creatinine, Ser: 0.98 mg/dL (ref 0.44–1.00)
GFR calc Af Amer: >60 mL/min (ref >60)
GFR calc non Af Amer: 52 mL/min — ABNORMAL LOW (ref >60)
Glucose, Bld: 107 mg/dL — ABNORMAL HIGH (ref 70–99)
Potassium: 4.1 mmol/L (ref 3.5–5.1)
Sodium: 140 mmol/L (ref 135–145)

## 2019-10-26 LAB — CBC
HCT: 33.5 % — ABNORMAL LOW (ref 36.0–46.0)
Hemoglobin: 10.9 g/dL — ABNORMAL LOW (ref 12.0–15.0)
MCH: 31.2 pg (ref 26.0–34.0)
MCHC: 32.5 g/dL (ref 30.0–36.0)
MCV: 96 fL (ref 80.0–100.0)
Platelets: 74 10*3/uL — ABNORMAL LOW (ref 150–400)
RBC: 3.49 MIL/uL — ABNORMAL LOW (ref 3.87–5.11)
RDW: 12.6 % (ref 11.5–15.5)
WBC: 7.1 10*3/uL (ref 4.0–10.5)
nRBC: 0 % (ref 0.0–0.2)

## 2019-10-26 LAB — ECHOCARDIOGRAM LIMITED
Height: 67.5 in
Weight: 1684.8 oz

## 2019-10-26 LAB — MAGNESIUM: Magnesium: 2 mg/dL (ref 1.7–2.4)

## 2019-10-26 MED ORDER — CLOPIDOGREL BISULFATE 75 MG PO TABS: 75.0000 mg | ORAL_TABLET | Freq: Every day | ORAL | 1 refills | Status: DC

## 2019-10-26 MED ORDER — CLOPIDOGREL BISULFATE 75 MG PO TABS
75.0000 mg | ORAL_TABLET | Freq: Every day | ORAL | Status: DC
  Administered 2019-10-26: 75 mg via ORAL
  Filled 2019-10-26: qty 1

## 2019-10-26 MED ORDER — WHITE PETROLATUM EX OINT
TOPICAL_OINTMENT | CUTANEOUS | Status: DC | PRN
  Filled 2019-10-26: qty 28.35

## 2019-10-26 MED ORDER — ASPIRIN 81 MG PO CHEW: 81.0000 mg | CHEWABLE_TABLET | Freq: Every day | ORAL | 3 refills | Status: DC

## 2019-10-26 MED FILL — Potassium Chloride Inj 2 mEq/ML: INTRAVENOUS | Qty: 40 | Status: AC

## 2019-10-26 MED FILL — Heparin Sodium (Porcine) Inj 1000 Unit/ML: INTRAMUSCULAR | Qty: 30 | Status: AC

## 2019-10-26 MED FILL — Magnesium Sulfate Inj 50%: INTRAMUSCULAR | Qty: 10 | Status: AC

## 2019-10-26 NOTE — Progress Notes (Signed)
Patient belongings and discharge summary given to daughter, IV d/c'd and patient wheeled down to pick by volunteer services.

## 2019-10-26 NOTE — Progress Notes (Signed)
Pt walked to bathroom w/ x1 assist this am and refused further ambulation for now. Groin sites unchanged from previous assessment

## 2019-10-26 NOTE — Progress Notes (Addendum)
[facility] VALVE TEAM  Patient Name: Carmen Blackwell Date of Encounter: 10/26/2019  Primary Cardiologist: Dr. Bettina Gavia / Dr. Angelena Form & Roxy Manns (TAVR)  Hospital Problem List     Principal Problem:   S/P TAVR (transcatheter aortic valve replacement) Active Problems:   Acquired hypothyroidism   Chronic kidney disease, stage III (moderate)   GERD (gastroesophageal reflux disease)   Hyperlipidemia   Essential hypertension   MALT lymphoma (HCC)   Sjogren's syndrome (HCC)   Thrombocytopenia (HCC)   Sciatica   Severe aortic stenosis   Benign positional vertigo   Raynaud's disease without gangrene     Subjective   Sitting up eating breakfast. No complaints. Has not been up walking around yet.   Inpatient Medications    Scheduled Meds: . aspirin  81 mg Oral Daily  . hydroxychloroquine  200 mg Oral Daily  . levothyroxine  50 mcg Oral QAC breakfast  . olopatadine  1 drop Both Eyes BID  . pravastatin  20 mg Oral q1800  . predniSONE  5 mg Oral Daily  . sodium chloride flush  3 mL Intravenous Q12H   Continuous Infusions: . sodium chloride    . cefUROXime (ZINACEF)  IV Stopped (10/25/19 2335)  . lactated ringers Stopped (10/25/19 1900)  . nitroGLYCERIN    . phenylephrine (NEO-SYNEPHRINE) Adult infusion    . vancomycin     PRN Meds: sodium chloride, acetaminophen **OR** acetaminophen, morphine injection, ondansetron (ZOFRAN) IV, oxyCODONE, sodium chloride flush, traMADol, white petrolatum   Vital Signs    Vitals:   10/25/19 2139 10/25/19 2200 10/25/19 2300 10/26/19 0523  BP: 117/62 (!) 119/58 (!) 120/56 (!) 116/48  Pulse:      Resp: 19 16 16 18   Temp: (!) 101.3 F (38.5 C)  (!) 100.4 F (38 C) (!) 101.1 F (38.4 C)  TempSrc: Oral  Oral Oral  SpO2: 100% 98% 99% 100%  Weight:    47.8 kg  Height:        Intake/Output Summary (Last 24 hours) at 10/26/2019 0617 Last data filed at 10/26/2019 0506 Gross per 24 hour  Intake 1540 ml    Output 1150 ml  Net 390 ml   Filed Weights   10/25/19 0853 10/26/19 0523  Weight: 45.4 kg 47.8 kg    Physical Exam   GEN: thin frail elderly white female HEENT: Grossly normal.  Neck: Supple, no JVD, carotid bruits, or masses. Cardiac: RRR, no murmurs, rubs, or gallops. No clubbing, cyanosis, edema.   Respiratory:  Respirations regular and unlabored, clear to auscultation bilaterally. GI: Soft, nontender, nondistended, BS + x 4. MS: no deformity or atrophy. Skin: warm and dry, no rash.  Groin sites clear without hematoma or ecchymosis  Neuro:  Strength and sensation are intact. Psych: AAOx3.  Normal affect.  Labs    CBC Recent Labs    10/25/19 1226 10/25/19 1339  HGB 8.5* 8.8*  HCT 25.0* 60.4*   Basic Metabolic Panel Recent Labs    10/25/19 1339 10/26/19 0420  NA 144 140  K 4.0 4.1  CL 104 105  CO2  --  25  GLUCOSE 95 107*  BUN 14 15  CREATININE 0.90 0.98  CALCIUM  --  8.4*  MG  --  2.0   Liver Function Tests No results for input(s): AST, ALT, ALKPHOS, BILITOT, PROT, ALBUMIN in the last 72 hours. No results for input(s): LIPASE, AMYLASE in the last 72 hours. Cardiac Enzymes No results for input(s): CKTOTAL, CKMB, CKMBINDEX,  TROPONINI in the last 72 hours. BNP Invalid input(s): POCBNP D-Dimer No results for input(s): DDIMER in the last 72 hours. Hemoglobin A1C No results for input(s): HGBA1C in the last 72 hours. Fasting Lipid Panel No results for input(s): CHOL, HDL, LDLCALC, TRIG, CHOLHDL, LDLDIRECT in the last 72 hours. Thyroid Function Tests No results for input(s): TSH, T4TOTAL, T3FREE, THYROIDAB in the last 72 hours.  Invalid input(s): FREET3  Telemetry    Sinus with 6 beats NSVT - Personally Reviewed  ECG    Sinus HR 79 bpm- Personally Reviewed  Radiology    ECHOCARDIOGRAM LIMITED  Result Date: 10/25/2019    ECHOCARDIOGRAM LIMITED REPORT   Patient Name:   Carmen Blackwell Date of Exam: 10/25/2019 Medical Rec #:  [id number]       Height:        68.0 in Accession #:    [id number]      Weight:       100.0 lb Date of Birth:  04-Nov-1930       BSA:          1.522 m Patient Age:    86 years        BP:           140/58 mmHg Patient Gender: F               HR:           60 bpm. Exam Location:  Inpatient Procedure: Limited Echo, Limited Color Doppler, Color Doppler and Echo Assisted            Procedure Indications:     Aortic Stenosis; TAVR Procedure  History:         Patient has prior history of Echocardiogram examinations, most                  recent 09/08/2019. Aortic Valve Disease; Risk                  Factors:Hypertension and Dyslipidemia.                  Aortic Valve: 26 mm Edwards Ultra Sapien, stented (TAVR) valve                  is present in the aortic position. Procedure Date: 10/25/2019.  Sonographer:     Mikki Santee RDCS (AE) Sonographer#2:   Dustin Flock Referring Phys:  [place] Diagnosing Phys: Sanda Klein MD                   PRE-PROCEDURAL FINDINGS:                   Normal left ventricular systolic function. Estimated LVEF                  60-65%.                  There are no regional wall motion abnormalities.                  Severe calcific aortic stenosis. Probably trileaflet aortic                  valve.                  Peak aortic valve gradient 58 mm Hg, mean gradient 36 mm Hg.                  Dimensionless obstructive index  0.17, calculated aortic valve                  area is 0.6 cm.                  Mild aortic insufficiency.                  No pericardial effusion.                   POST-PROCEDURAL FINDINGS:                   Hyperdynamic left ventricular systolic function. Estimated LVEF                  75%.                  There are no regional wall motion abnormalities.                  Well-seated TAVR stent-valve.                  Peak aortic valve gradient 3 mm Hg, mean gradient 2 mm Hg.                  Dimensionless obstructive index 0.72, calculated aortic valve                   area is 2.5 cm.                  There is no aortic insufficiency and no perivalvular leak.No                  mitral insufficiency.                  No pericardial effusion. IMPRESSIONS  1. There is a 26 mm Edwards Ultra, stented (TAVR) valve present in the aortic position. Procedure Date: 10/25/2019. FINDINGS  Aortic Valve: Aortic valve mean gradient measures 1.8 mmHg. Aortic valve peak gradient measures 3.0 mmHg. Aortic valve area, by VTI measures 2.51 cm. There is a 26 mm Edwards Ultra, stented (TAVR) valve present in the aortic position. Procedure Date: 10/25/2019.  LEFT VENTRICLE PLAX 2D LVOT diam:     2.10 cm LV SV:         59 LV SV Index:   39 LVOT Area:     3.46 cm  AORTIC VALVE AV Area (Vmax):    2.70 cm AV Area (Vmean):   2.53 cm AV Area (VTI):     2.51 cm AV Vmax:           87.26 cm/s AV Vmean:          64.728 cm/s AV VTI:            0.235 m AV Peak Grad:      3.0 mmHg AV Mean Grad:      1.8 mmHg LVOT Vmax:         67.91 cm/s LVOT Vmean:        47.314 cm/s LVOT VTI:          0.170 m LVOT/AV VTI ratio: 0.72  SHUNTS Systemic VTI:  0.17 m Systemic Diam: 2.10 cm Sanda Klein MD Electronically signed by Sanda Klein MD Signature Date/Time: 10/25/2019/1:49:14 PM    Final    Structural Heart Procedure  Result Date: 10/25/2019 See surgical note for result.   Cardiac Studies   TAVR OPERATIVE NOTE   Date of  Procedure:                10/25/2019  Preoperative Diagnosis:      Severe Aortic Stenosis   Postoperative Diagnosis:    Same   Procedure:        Transcatheter Aortic Valve Replacement - Percutaneous Right Transfemoral Approach             Edwards Sapien 3 Ultra THV (size 26 mm, model # 9750TFX, serial # [id number])              Co-Surgeons:                        Valentina Gu. Roxy Manns, MD and Lauree Chandler, MD  Anesthesiologist:                  Renold Don, MD  Echocardiographer:              Sanda Klein, MD  Pre-operative Echo Findings: ? Severe aortic  stenosis ? Normal left ventricular systolic function  Post-operative Echo Findings: ? No paravalvular leak ? Normal left ventricular systolic function  ____________________   Echo 10/26/19: pending   Patient Profile     Tawana Pasch is a 86 y.o. female with a history of HTN, HLD, BPPV, MALT lymphoma in remission, Sjogren's syndrome on low-dose chronic steroids, chronic leukopenia and thrombocytopenia secondary to Sjogren's syndrome, GERD, CKD stage IIIa and severe AS who presented to [facility] on 07/25/2019 for planned TAVR.  Assessment & Plan    Severe AS: s/p successful TAVR with a 26 mm Edwards Sapien 3 Ultra THV via the TF approach on 10/25/19. Post operative echo pending. Groin sites are stable. ECG with sinus and no high grade heart block. Started on Asprin and plavix. Potential DC home with daughter later today once she ambulates and has echo.   Fever: temperature up to 101.3.  No cough or symptoms of UTI. Feels okay aside from some slight weakness. Down to 99.1 deg F now.   Chronic anemia: Hg stable ~ 10.9.  HTN: BP well controlled   Signed, Angelena Form, PA-C  10/26/2019, 6:17 AM  Pager [phone]

## 2019-10-26 NOTE — Progress Notes (Signed)
CARDIAC REHAB PHASE I   PRE:  Rate/Rhythm: 77 SR  BP:  Supine: 111/61  Sitting:   Standing:    SaO2: 96%RA  MODE:  Ambulation: 160 ft   POST:  Rate/Rhythm: 91  BP:  Supine:   Sitting: 130/82  Standing:    SaO2: 99%RA 1019-1108 Pt assisted to bathroom and then walked 160 ft with rollator, gait belt and asst x 1. Did not need to sit. Taped IV prior to walking in hallway to secure. Pt stated it had pulled a  little going to bathroom. Pt stated she will have assistance at home to assist with walking until stronger. Stated she has shoe which helps with her walking as she stated one leg a little shorter. Discussed with pt we would put shoes on before any more walks here. No c/o SOB and sats good on RA. To bed after walk. RN in to help prepare for discharge and to remove IV.   Graylon Good, RN BSN  10/26/2019 11:05 AM

## 2019-10-26 NOTE — Progress Notes (Signed)
  Echocardiogram 2D Echocardiogram limited has been performed.  Carmen Blackwell M 10/26/2019, 10:34 AM

## 2019-10-26 NOTE — Progress Notes (Signed)
VAncomycin dose received from phamacy is 1000mg  /245ml.  ORdered dose is 500mg .  Delivered partial dose of 139ml of bag received from pharmacy.

## 2019-10-26 NOTE — Discharge Summary (Addendum)
[facility] TE[facility]  Discharge Summary    Patient ID: Carmen Blackwell MRN: [id number]; DOB: 21-Sep-1930  Admit date: 10/25/2019 Discharge date: 10/26/2019  Primary Care Provider: Algis Greenhouse, MD  Primary Cardiologist: Dr. Bettina Gavia / Dr. Angelena Form & Roxy Manns (TAVR)   Discharge Diagnoses    Principal Problem:   S/P TAVR (transcatheter aortic valve replacement) Active Problems:   Acquired hypothyroidism   Chronic kidney disease, stage III (moderate)   GERD (gastroesophageal reflux disease)   Hyperlipidemia   Essential hypertension   MALT lymphoma (HCC)   Sjogren's syndrome (HCC)   Thrombocytopenia (HCC)   Sciatica   Severe aortic stenosis   Benign positional vertigo   Raynaud's disease without gangrene   Allergies Allergies  Allergen Reactions  . Nsaids     unkn  . Oxybutynin Other (See Comments)    dizziness dizziness   . Requip [Ropinirole Hcl]   . Ropinirole Other (See Comments)    Other reaction(s): Hypotension (ALLERGY/intolerance) Other reaction(s): Hypotension (ALLERGY/intolerance)     Diagnostic Studies/Procedures    TAVR OPERATIVE NOTE   Date of Procedure:10/25/2019  Preoperative Diagnosis:Severe Aortic Stenosis   Postoperative Diagnosis:Same   Procedure:   Transcatheter Aortic Valve Replacement - PercutaneousRightTransfemoral Approach Edwards Sapien 3 Ultra THV (size 20mm, model # 9750TFX, serial # [id number])  Co-Surgeons:Clarence H. Roxy Manns, MD and Lauree Chandler, MD  Anesthesiologist:Thomas Fransisco Beau, MD  Echocardiographer:Mihai Croitoru, MD  Pre-operative Echo Findings: ? Severe aortic stenosis ? Normalleft ventricular systolic function  Post-operative Echo Findings: ? Noparavalvular leak ? Normalleft ventricular systolic function  ____________________   Echo  10/26/19: completed but pending formal read at the time of discharge     History of Present Illness     Carmen Blackwell is a 86 y.o. female with a history of HTN, HLD, BPPV, MALT lymphoma in remission, Sjogren's syndrome on low-dose chronic steroids, chronic leukopenia and thrombocytopenia secondary to Sjogren's syndrome, GERD, CKD stage IIIa and severe AS who presented to [facility] on 10/25/2019 for planned TAVR.  Patient states that she was first noted to have a heart murmur on physical exam 4-5 years ago.  Echocardiogram revealed findings consistent with moderate aortic stenosis with preserved left ventricular systolic function and she has been followed ever since by Dr. Bettina Gavia. Recent follow-up echocardiogram revealed significant progression of disease with peak velocity across aortic valve measured 4.7 m/s corresponding to mean transvalvular gradient estimated 61 mmHg and aortic valve area calculated only 0.60 cm with DVI reported only 0.19. Left ventricular systolic function remains preserved with ejection fraction estimated 55 to 60%. Diagnostic cardiac catheterization performed October 12, 2019 revealed mild nonobstructive coronary artery disease.  The aortic valve was not crossed to measure gradient.  Right-sided pressures were normal.    The patient has been evaluated by the multidisciplinary valve team and felt to have severe, symptomatic aortic stenosis and to be a suitable candidate for TAVR, which was set up for 10/25/19.   Hospital Course     Consultants: none  Severe AS:s/p successful TAVR with a 26 mm Edwards Sapien 3 Ultra THV via the TF approach on 10/25/19. Post operative echo completed but pending formal read. Personally reviewed and LV function looks good with normally functioning TAVR with no PVL and mean gradient ~5 mm Hg. Groin sites are stable. ECG with sinus and no high grade heart block. Started on Asprin and plavix which were filled prior to discharge via the  transition of care pharmacy. Plan for DC home  today with close follow up in the office next week.  Fever: temperature up to 101.3.  No cough or symptoms of UTI. Feels okay aside from some slight weakness. Down to 99.1 deg F now.   Chronic anemia: Hg stable ~ 10.9.  HTN: BP well controlled   Incidental findings:  Small pulmonary nodules in the lungs measuring 4 mm or less in size, nonspecific, but statistically benign. No follow-up needed if patient is low-risk (and has no known or suspected primary neoplasm). Non-contrast chest CT can be considered in 12 months if patient is high-risk.  Fusiform infrarenal abdominal aortic aneurysm measuring up to 3.1 x 3.3 cm in diameter. Recommend followup by ultrasound in 3 years.  These will be discussed in the outpatient setting.  _____________  Discharge Vitals Blood pressure (!) 116/50, pulse 86, temperature 99.1 F (37.3 C), temperature source Oral, resp. rate 19, height 5' 7.5" (1.715 m), weight 47.8 kg, SpO2 99 %.  Filed Weights   10/25/19 0853 10/26/19 0523  Weight: 45.4 kg 47.8 kg    Labs & Radiologic Studies    CBC Recent Labs    10/25/19 1339 10/26/19 0420  WBC  --  7.1  HGB 8.8* 10.9*  HCT 26.0* 33.5*  MCV  --  96.0  PLT  --  74*   Basic Metabolic Panel Recent Labs    10/25/19 1339 10/26/19 0420  NA 144 140  K 4.0 4.1  CL 104 105  CO2  --  25  GLUCOSE 95 107*  BUN 14 15  CREATININE 0.90 0.98  CALCIUM  --  8.4*  MG  --  2.0   Liver Function Tests No results for input(s): AST, ALT, ALKPHOS, BILITOT, PROT, ALBUMIN in the last 72 hours. No results for input(s): LIPASE, AMYLASE in the last 72 hours. Cardiac Enzymes No results for input(s): CKTOTAL, CKMB, CKMBINDEX, TROPONINI in the last 72 hours. BNP Invalid input(s): POCBNP D-Dimer No results for input(s): DDIMER in the last 72 hours. Hemoglobin A1C No results for input(s): HGBA1C in the last 72 hours. Fasting Lipid Panel No results for input(s): CHOL, HDL,  LDLCALC, TRIG, CHOLHDL, LDLDIRECT in the last 72 hours. Thyroid Function Tests No results for input(s): TSH, T4TOTAL, T3FREE, THYROIDAB in the last 72 hours.  Invalid input(s): FREET3 _____________  DG Chest 2 View  Result Date: 10/22/2019 CLINICAL DATA:  Preoperative exam, TAVR scheduled for Thursday EXAM: CHEST - 2 VIEW COMPARISON:  CT 10/19/2019 FINDINGS: Mild hyperinflation is similar to prior. Stable bandlike area of opacity in the right lung base extending to the mediastinal border compatible with an area of nodular scarring seen on comparison CT. No new consolidative opacity. No convincing features of edema. No pneumothorax or effusion. The aorta is calcified. The remaining cardiomediastinal contours are unremarkable. No acute osseous or soft tissue abnormality. Multilevel degenerative changes are present in the imaged portions of the spine. IMPRESSION: 1. Stable hyperinflation. Bandlike area of scarring in the right lung base, better to detailed on comparison CT. 2. No acute cardiopulmonary abnormality. Electronically Signed   By: Lovena Le M.D.   On: 10/22/2019 19:14   CARDIAC CATHETERIZATION  Result Date: 10/12/2019  Prox RCA lesion is 25% stenosed.  Ost LAD to Prox LAD lesion is 25% stenosed.  1st Diag lesion is 25% stenosed.  Mid LAD lesion is 30% stenosed.  There is severe aortic valve stenosis, based on prior echo.  Ao 100%, PA sat 80%, PA 19/4, mean PA pressure 9 mm Hg, mean PCWP 5  mm Hg; CO 5.9 L/min; CI 3.97  Nonobstructive CAD.  Normal right heart pressures.  Valve not crossed. Continue plans for TAVR workup.   CT CORONARY MORPH W/CTA COR W/SCORE W/CA W/CM &/OR WO/CM  Addendum Date: 10/19/2019   ADDENDUM REPORT: 10/19/2019 15:06 CLINICAL DATA:  Aortic stenosis EXAM: Cardiac TAVR CT TECHNIQUE: The patient was scanned on a Siemens Force 450 slice scanner. A 120 kV retrospective scan was triggered in the descending thoracic aorta at 111 HU's. Gantry rotation speed was 270  msecs and collimation was .9 mm. No beta blockade or nitro were given. The 3D data set was reconstructed in 5% intervals of the R-R cycle. Systolic and diastolic phases were analyzed on a dedicated work station using MPR, MIP and VRT modes. The patient received 80 cc of contrast. FINDINGS: Aortic Valve: Tri cuspid calcified with restricted leaflet motion Aorta: Moderate mixed calcific plaque with normal arch vessels Sinotubular Junction: 24 mm with calcification Ascending Thoracic Aorta: 29 mm Aortic Arch: 29 mm Descending Thoracic Aorta: 20 mm Sinus of Valsalva Measurements: Non-coronary: 28.1 mm Right - coronary: 25.6 mm Left - coronary: 28.6 mm Coronary Artery Height above Annulus: Left Main: 11 mm above annulus Right Coronary: 14.9 mm above annulus Virtual Basal Annulus Measurements: Maximum/Minimum Diameter: 25.9 mm x 21.9 mm Perimeter: 76 mm Area: 445 mm 2 Coronary Arteries: Sufficient height above annulus for deployment Optimum Fluoroscopic Angle for Delivery: LAO 5 Caudal 8 degrees IMPRESSION: 1. Tri leaflet AV with annular area of 445 mm 2 suitable for a 26 mm Sapien 3 valve 2.  Coronary arteries suitable height above annulus for deployment 3.  Optimum angiographic angle for deployment LAO 5 Caudal 8 degrees 4.  Normal aortic root 2.9 cm Jenkins Rouge Electronically Signed   By: Jenkins Rouge M.D.   On: 10/19/2019 15:06   Result Date: 10/19/2019 EXAM: OVER-READ INTERPRETATION  CT CHEST The following report is an over-read performed by radiologist Dr. Vinnie Langton of [facility] [place] on 10/19/2019. This over-read does not include interpretation of cardiac or coronary anatomy or pathology. The coronary calcium score/coronary CTA interpretation by the cardiologist is attached. COMPARISON:  None. FINDINGS: Extracardiac findings are described separately under dictation for contemporaneously obtained CTA of the chest, abdomen and pelvis. IMPRESSION: Please see separate dictation for contemporaneously  obtained CTA chest, abdomen and pelvis dated 10/19/2019 for full description of relevant extracardiac findings. Electronically Signed: By: Vinnie Langton M.D. On: 10/19/2019 12:34   CT ANGIO CHEST AORTA W/CM & OR WO/CM  Result Date: 10/19/2019 CLINICAL DATA:  86 year old female with history of severe aortic stenosis. Preprocedural study prior to potential transcatheter aortic valve replacement (TAVR) procedure. EXAM: CT ANGIOGRAPHY CHEST, ABDOMEN AND PELVIS TECHNIQUE: Non-contrast CT of the chest was initially obtained. Multidetector CT imaging through the chest, abdomen and pelvis was performed using the standard protocol during bolus administration of intravenous contrast. Multiplanar reconstructed images and MIPs were obtained and reviewed to evaluate the vascular anatomy. CONTRAST:  61mL OMNIPAQUE IOHEXOL 350 MG/ML SOLN COMPARISON:  CT the abdomen and pelvis 08/25/2015. FINDINGS: CTA CHEST FINDINGS Cardiovascular: Heart size is normal. There is no significant pericardial fluid, thickening or pericardial calcification. There is aortic atherosclerosis, as well as atherosclerosis of the great vessels of the mediastinum and the coronary arteries, including calcified atherosclerotic plaque in the left main, left anterior descending and left circumflex coronary arteries. Severe thickening calcification of the aortic valve. Mediastinum/Lymph Nodes: No pathologically enlarged mediastinal or hilar lymph nodes. Esophagus is unremarkable in appearance. No axillary  lymphadenopathy. Lungs/Pleura: Nodular scarring and architectural distortion in the anterior aspect of the right lower lobe abutting the major fissure, very similar to prior CT the abdomen and pelvis 08/25/2015. 4 mm subpleural nodule in the posterior aspect of the left upper lobe abutting the major fissure (axial image 29 of series 8). A few other scattered 1-2 mm pulmonary nodules are noted in the periphery of the lungs bilaterally, likely to reflect  areas of mild chronic mucoid impaction within terminal bronchioles. No other larger more suspicious appearing pulmonary nodules or masses are noted. No acute consolidative airspace disease. No pleural effusions. Musculoskeletal/Soft Tissues: There are no aggressive appearing lytic or blastic lesions noted in the visualized portions of the skeleton. CTA ABDOMEN AND PELVIS FINDINGS Hepatobiliary: No suspicious cystic or solid hepatic lesions. No intra or extrahepatic biliary ductal dilatation. Gallbladder is normal in appearance. Pancreas: No pancreatic mass. No pancreatic ductal dilatation. No pancreatic or peripancreatic fluid collections or inflammatory changes. Spleen: Unremarkable. Adrenals/Urinary Tract: Bilateral adrenal glands in the right kidney are normal in appearance. Left kidney is severely atrophic. Left hydroureteronephrosis which terminates in the distal third of the left ureter, suggesting a chronic left ureteral stricture. Urinary bladder is normal in appearance. Stomach/Bowel: Normal appearance of the stomach. No pathologic dilatation of small bowel or colon. Numerous colonic diverticulae are noted, without surrounding inflammatory changes to suggest an acute diverticulitis at this time. The appendix is not confidently identified and may be surgically absent. Regardless, there are no inflammatory changes noted adjacent to the cecum to suggest the presence of an acute appendicitis at this time. Vascular/Lymphatic: Aortic atherosclerosis with fusiform infrarenal abdominal aortic aneurysm measuring up to 3.1 x 3.3 cm in diameter ( axial image 131 of series 7). Vascular findings and measurements pertinent to potential TAVR procedure, as detailed below. No pathologically enlarged abdominal or pelvic lymph nodes. Reproductive: Status post hysterectomy. Ovaries are not confidently identified may be surgically absent or atrophic Other: No significant volume of ascites.  No pneumoperitoneum. Musculoskeletal:  There are no aggressive appearing lytic or blastic lesions noted in the visualized portions of the skeleton. VASCULAR MEASUREMENTS PERTINENT TO TAVR: AORTA: Minimal Aortic Diameter-21 x 18 mm Severity of Aortic Calcification-moderate RIGHT PELVIS: Right Common Iliac Artery - Minimal Diameter-8.6 x 9.0 mm Tortuosity-mild Calcification-mild-to-moderate Right External Iliac Artery - Minimal Diameter-6.8 x 6.1 mm Tortuosity - mild Calcification-none Right Common Femoral Artery - Minimal Diameter-6.8 x 6.2 mm Tortuosity - mild Calcification-mild-to-moderate LEFT PELVIS: Left Common Iliac Artery - Minimal Diameter-9.4 x 9.1 mm Tortuosity - mild Calcification-mild-to-moderate Left External Iliac Artery - Minimal Diameter-6.5 x 6.0 mm Tortuosity - mild Calcification-none Left Common Femoral Artery - Minimal Diameter-6.4 x 6.1 mm Tortuosity - mild Calcification-mild-to-moderate Review of the MIP images confirms the above findings. IMPRESSION: 1. Vascular findings and measurements pertinent to potential TAVR procedure, as detailed above. 2. Severe thickening calcification of the aortic valve, compatible with reported clinical history of severe aortic stenosis. 3. Small pulmonary nodules in the lungs measuring 4 mm or less in size, nonspecific, but statistically benign. No follow-up needed if patient is low-risk (and has no known or suspected primary neoplasm). Non-contrast chest CT can be considered in 12 months if patient is high-risk. This recommendation follows the consensus statement: Guidelines for Management of Incidental Pulmonary Nodules Detected on CT Images: From the Fleischner Society 2017; Radiology 2017; 284:228-243. 4. Fusiform infrarenal abdominal aortic aneurysm measuring up to 3.1 x 3.3 cm in diameter. Recommend followup by ultrasound in 3 years. This recommendation follows ACR consensus  guidelines: White Paper of the ACR Incidental Findings Committee II on Vascular Findings. J Am Coll Radiol 2013;  10:789-794. 5. Colonic diverticulosis without evidence of acute diverticulitis at this time. 6. Additional incidental findings, as above. Electronically Signed   By: Vinnie Langton M.D.   On: 10/19/2019 14:40   VAS US CAROTID  Result Date: 10/19/2019 Carotid Arterial Duplex Study Indications:       Pre-surgical evaluation. Risk Factors:      Hypertension, hyperlipidemia. Other Factors:     Severe aortic stenosis. Comparison Study:  12/08/2017- carotid artery duplex Performing Technologist: Maudry Mayhew MHA, RDMS, RVT, RDCS  Examination Guidelines: A complete evaluation includes B-mode imaging, spectral Doppler, color Doppler, and power Doppler as needed of all accessible portions of each vessel. Bilateral testing is considered an integral part of a complete examination. Limited examinations for reoccurring indications may be performed as noted.  Right Carotid Findings: +----------+--------+--------+--------+------------------+------------------+           PSV cm/sEDV cm/sStenosisPlaque DescriptionComments           +----------+--------+--------+--------+------------------+------------------+ CCA Prox  67      7                                 intimal thickening +----------+--------+--------+--------+------------------+------------------+ CCA Distal53      11                                intimal thickening +----------+--------+--------+--------+------------------+------------------+ ICA Prox  70      18                                                   +----------+--------+--------+--------+------------------+------------------+ ICA Distal99      21                                                   +----------+--------+--------+--------+------------------+------------------+ ECA       46                                                           +----------+--------+--------+--------+------------------+------------------+  +----------+--------+-------+----------------+-------------------+           PSV cm/sEDV cmsDescribe        Arm Pressure (mmHG) +----------+--------+-------+----------------+-------------------+ [id number]             Multiphasic, WNL                    +----------+--------+-------+----------------+-------------------+ +---------+--------+--+--------+--+---------+ VertebralPSV cm/s62EDV cm/s12Antegrade +---------+--------+--+--------+--+---------+  Left Carotid Findings: +----------+--------+--------+--------+--------------------------+--------+           PSV cm/sEDV cm/sStenosisPlaque Description        Comments +----------+--------+--------+--------+--------------------------+--------+ CCA Prox  85      10                                                 +----------+--------+--------+--------+--------------------------+--------+  CCA Distal46      10              heterogenous and irregular         +----------+--------+--------+--------+--------------------------+--------+ ICA Prox  51      11              smooth and heterogenous            +----------+--------+--------+--------+--------------------------+--------+ ICA Distal77      17                                                 +----------+--------+--------+--------+--------------------------+--------+ ECA       44                                                         +----------+--------+--------+--------+--------------------------+--------+ +----------+--------+--------+----------------+-------------------+           PSV cm/sEDV cm/sDescribe        Arm Pressure (mmHG) +----------+--------+--------+----------------+-------------------+ Subclavian89              Multiphasic, WNL                    +----------+--------+--------+----------------+-------------------+ +---------+--------+--+--------+-+---------+ VertebralPSV cm/s33EDV cm/s5Antegrade  +---------+--------+--+--------+-+---------+   Summary: Right Carotid: Velocities in the right ICA are consistent with a 1-39% stenosis. Left Carotid: Velocities in the left ICA are consistent with a 1-39% stenosis. Vertebrals:  Bilateral vertebral arteries demonstrate antegrade flow. Subclavians: Normal flow hemodynamics were seen in bilateral subclavian              arteries. *See table(s) above for measurements and observations.  Electronically signed by Curt Jews MD on 10/19/2019 at 3:44:42 PM.    Final    ECHOCARDIOGRAM LIMITED  Result Date: 10/25/2019    ECHOCARDIOGRAM LIMITED REPORT   Patient Name:   Carmen Blackwell Date of Exam: 10/25/2019 Medical Rec #:  [id number]       Height:       68.0 in Accession #:    [id number]      Weight:       100.0 lb Date of Birth:  [birth year]       BSA:          1.522 m Patient Age:    57 years        BP:           140/58 mmHg Patient Gender: F               HR:           60 bpm. Exam Location:  Inpatient Procedure: Limited Echo, Limited Color Doppler, Color Doppler and Echo Assisted            Procedure Indications:     Aortic Stenosis; TAVR Procedure  History:         Patient has prior history of Echocardiogram examinations, most                  recent 09/08/2019. Aortic Valve Disease; Risk                  Factors:Hypertension and Dyslipidemia.  Aortic Valve: 26 mm Edwards Ultra Sapien, stented (TAVR) valve                  is present in the aortic position. Procedure Date: 10/25/2019.  Sonographer:     Mikki Santee RDCS (AE) Sonographer#2:   Dustin Flock Referring Phys:  [place] Diagnosing Phys: Sanda Klein MD                   PRE-PROCEDURAL FINDINGS:                   Normal left ventricular systolic function. Estimated LVEF                  60-65%.                  There are no regional wall motion abnormalities.                  Severe calcific aortic stenosis. Probably trileaflet aortic                  valve.                   Peak aortic valve gradient 58 mm Hg, mean gradient 36 mm Hg.                  Dimensionless obstructive index 0.17, calculated aortic valve                  area is 0.6 cm.                  Mild aortic insufficiency.                  No pericardial effusion.                   POST-PROCEDURAL FINDINGS:                   Hyperdynamic left ventricular systolic function. Estimated LVEF                  75%.                  There are no regional wall motion abnormalities.                  Well-seated TAVR stent-valve.                  Peak aortic valve gradient 3 mm Hg, mean gradient 2 mm Hg.                  Dimensionless obstructive index 0.72, calculated aortic valve                  area is 2.5 cm.                  There is no aortic insufficiency and no perivalvular leak.No                  mitral insufficiency.                  No pericardial effusion. IMPRESSIONS  1. There is a 26 mm Edwards Ultra, stented (TAVR) valve present in the aortic position. Procedure Date: 10/25/2019. FINDINGS  Aortic Valve: Aortic valve mean gradient measures 1.8 mmHg. Aortic valve peak gradient measures 3.0 mmHg. Aortic valve area, by VTI measures 2.51 cm.  There is a 26 mm Edwards Ultra, stented (TAVR) valve present in the aortic position. Procedure Date: 10/25/2019.  LEFT VENTRICLE PLAX 2D LVOT diam:     2.10 cm LV SV:         59 LV SV Index:   39 LVOT Area:     3.46 cm  AORTIC VALVE AV Area (Vmax):    2.70 cm AV Area (Vmean):   2.53 cm AV Area (VTI):     2.51 cm AV Vmax:           87.26 cm/s AV Vmean:          64.728 cm/s AV VTI:            0.235 m AV Peak Grad:      3.0 mmHg AV Mean Grad:      1.8 mmHg LVOT Vmax:         67.91 cm/s LVOT Vmean:        47.314 cm/s LVOT VTI:          0.170 m LVOT/AV VTI ratio: 0.72  SHUNTS Systemic VTI:  0.17 m Systemic Diam: 2.10 cm Sanda Klein MD Electronically signed by Sanda Klein MD Signature Date/Time: 10/25/2019/1:49:14 PM    Final    Structural Heart Procedure  Result  Date: 10/25/2019 See surgical note for result.  CT Angio Abd/Pel w/ and/or w/o  Result Date: 10/19/2019 CLINICAL DATA:  86 year old female with history of severe aortic stenosis. Preprocedural study prior to potential transcatheter aortic valve replacement (TAVR) procedure. EXAM: CT ANGIOGRAPHY CHEST, ABDOMEN AND PELVIS TECHNIQUE: Non-contrast CT of the chest was initially obtained. Multidetector CT imaging through the chest, abdomen and pelvis was performed using the standard protocol during bolus administration of intravenous contrast. Multiplanar reconstructed images and MIPs were obtained and reviewed to evaluate the vascular anatomy. CONTRAST:  39mL OMNIPAQUE IOHEXOL 350 MG/ML SOLN COMPARISON:  CT the abdomen and pelvis 08/25/2015. FINDINGS: CTA CHEST FINDINGS Cardiovascular: Heart size is normal. There is no significant pericardial fluid, thickening or pericardial calcification. There is aortic atherosclerosis, as well as atherosclerosis of the great vessels of the mediastinum and the coronary arteries, including calcified atherosclerotic plaque in the left main, left anterior descending and left circumflex coronary arteries. Severe thickening calcification of the aortic valve. Mediastinum/Lymph Nodes: No pathologically enlarged mediastinal or hilar lymph nodes. Esophagus is unremarkable in appearance. No axillary lymphadenopathy. Lungs/Pleura: Nodular scarring and architectural distortion in the anterior aspect of the right lower lobe abutting the major fissure, very similar to prior CT the abdomen and pelvis 08/25/2015. 4 mm subpleural nodule in the posterior aspect of the left upper lobe abutting the major fissure (axial image 29 of series 8). A few other scattered 1-2 mm pulmonary nodules are noted in the periphery of the lungs bilaterally, likely to reflect areas of mild chronic mucoid impaction within terminal bronchioles. No other larger more suspicious appearing pulmonary nodules or masses are  noted. No acute consolidative airspace disease. No pleural effusions. Musculoskeletal/Soft Tissues: There are no aggressive appearing lytic or blastic lesions noted in the visualized portions of the skeleton. CTA ABDOMEN AND PELVIS FINDINGS Hepatobiliary: No suspicious cystic or solid hepatic lesions. No intra or extrahepatic biliary ductal dilatation. Gallbladder is normal in appearance. Pancreas: No pancreatic mass. No pancreatic ductal dilatation. No pancreatic or peripancreatic fluid collections or inflammatory changes. Spleen: Unremarkable. Adrenals/Urinary Tract: Bilateral adrenal glands in the right kidney are normal in appearance. Left kidney is severely atrophic. Left hydroureteronephrosis which terminates in the distal third of  the left ureter, suggesting a chronic left ureteral stricture. Urinary bladder is normal in appearance. Stomach/Bowel: Normal appearance of the stomach. No pathologic dilatation of small bowel or colon. Numerous colonic diverticulae are noted, without surrounding inflammatory changes to suggest an acute diverticulitis at this time. The appendix is not confidently identified and may be surgically absent. Regardless, there are no inflammatory changes noted adjacent to the cecum to suggest the presence of an acute appendicitis at this time. Vascular/Lymphatic: Aortic atherosclerosis with fusiform infrarenal abdominal aortic aneurysm measuring up to 3.1 x 3.3 cm in diameter ( axial image 131 of series 7). Vascular findings and measurements pertinent to potential TAVR procedure, as detailed below. No pathologically enlarged abdominal or pelvic lymph nodes. Reproductive: Status post hysterectomy. Ovaries are not confidently identified may be surgically absent or atrophic Other: No significant volume of ascites.  No pneumoperitoneum. Musculoskeletal: There are no aggressive appearing lytic or blastic lesions noted in the visualized portions of the skeleton. VASCULAR MEASUREMENTS PERTINENT  TO TAVR: AORTA: Minimal Aortic Diameter-21 x 18 mm Severity of Aortic Calcification-moderate RIGHT PELVIS: Right Common Iliac Artery - Minimal Diameter-8.6 x 9.0 mm Tortuosity-mild Calcification-mild-to-moderate Right External Iliac Artery - Minimal Diameter-6.8 x 6.1 mm Tortuosity - mild Calcification-none Right Common Femoral Artery - Minimal Diameter-6.8 x 6.2 mm Tortuosity - mild Calcification-mild-to-moderate LEFT PELVIS: Left Common Iliac Artery - Minimal Diameter-9.4 x 9.1 mm Tortuosity - mild Calcification-mild-to-moderate Left External Iliac Artery - Minimal Diameter-6.5 x 6.0 mm Tortuosity - mild Calcification-none Left Common Femoral Artery - Minimal Diameter-6.4 x 6.1 mm Tortuosity - mild Calcification-mild-to-moderate Review of the MIP images confirms the above findings. IMPRESSION: 1. Vascular findings and measurements pertinent to potential TAVR procedure, as detailed above. 2. Severe thickening calcification of the aortic valve, compatible with reported clinical history of severe aortic stenosis. 3. Small pulmonary nodules in the lungs measuring 4 mm or less in size, nonspecific, but statistically benign. No follow-up needed if patient is low-risk (and has no known or suspected primary neoplasm). Non-contrast chest CT can be considered in 12 months if patient is high-risk. This recommendation follows the consensus statement: Guidelines for Management of Incidental Pulmonary Nodules Detected on CT Images: From the Fleischner Society 2017; Radiology 2017; 284:228-243. 4. Fusiform infrarenal abdominal aortic aneurysm measuring up to 3.1 x 3.3 cm in diameter. Recommend followup by ultrasound in 3 years. This recommendation follows ACR consensus guidelines: White Paper of the ACR Incidental Findings Committee II on Vascular Findings. J Am Coll Radiol 2013; 10:789-794. 5. Colonic diverticulosis without evidence of acute diverticulitis at this time. 6. Additional incidental findings, as above.  Electronically Signed   By: Vinnie Langton M.D.   On: 10/19/2019 14:40   Disposition   Pt is being discharged home today in good condition.  Follow-up Plans & Appointments     Follow-up Information    Eileen Stanford, PA-C. Go on 11/02/2019.   Specialties: Cardiology, Radiology Why: @ 1:30pm, please arrive at least 10 minutes early.  Contact information: [address] [place] [address] [phone]                Discharge Medications   Allergies as of 10/26/2019      Reactions   Nsaids    unkn   Oxybutynin Other (See Comments)   dizziness dizziness   Requip [ropinirole Hcl]    Ropinirole Other (See Comments)   Other reaction(s): Hypotension (ALLERGY/intolerance) Other reaction(s): Hypotension (ALLERGY/intolerance)      Medication List    TAKE  these medications   antiseptic oral rinse Liqd 15 mLs by Mouth Rinse route as needed for dry mouth. Mouth spray   aspirin 81 MG chewable tablet Chew 1 tablet (81 mg total) by mouth daily. Notes to patient: **NEW** To prevent clot in new valve   CALCIUM 600+D3 PO Take 1 capsule by mouth daily.   clopidogrel 75 MG tablet Commonly known as: PLAVIX Take 1 tablet (75 mg total) by mouth daily. Notes to patient: **NEW** To prevent clot in new valve   furosemide 20 MG tablet Commonly known as: Lasix Take  20 mg Lasix on Monday, Wednesday and Friday.   hydroxychloroquine 200 MG tablet Commonly known as: PLAQUENIL Take 200 mg by mouth daily.   levothyroxine 50 MCG tablet Commonly known as: SYNTHROID Take 50 mcg by mouth daily before breakfast.   lovastatin 20 MG tablet Commonly known as: MEVACOR TAKE 1 TABLET BY MOUTH EVERY DAY   Magnesium Gluconate 250 MG Tabs Take 250 mg by mouth daily.   Pataday 0.1 % ophthalmic solution Generic drug: olopatadine Place 1 drop into both eyes 2 (two) times daily.   potassium gluconate 595 (99 K) MG Tabs tablet Take 595 mg by mouth daily.     predniSONE 5 MG tablet Commonly known as: DELTASONE Take 5 mg by mouth daily.   Replens Gel Place 1 application vaginally See admin instructions. Every 3 days   traMADol 50 MG tablet Commonly known as: ULTRAM Take 50 mg by mouth at bedtime.           Outstanding Labs/Studies   none  Duration of Discharge Encounter   Greater than 30 minutes including physician time.  Mable Fill, PA-C 10/26/2019, 10:50 AM [phone]

## 2019-10-27 ENCOUNTER — Telehealth: Payer: Self-pay

## 2019-10-27 NOTE — Telephone Encounter (Signed)
Daughter, Marcie Bal, contacted regarding discharge from [facility] on 10/26/2019.  Patient understands to follow up with provider Nell Range PA-C on 11/02/2019 at 1:30 PM at [facility] office. Patient understands discharge instructions? yes Patient understands medications and regiment? yes Patient understands to bring all medications to this visit? yes

## 2019-11-01 NOTE — Progress Notes (Signed)
[facility]                                       Cardiology Office Note    Date:  11/02/2019   ID:  Carmen Blackwell, DOB Jun 14, 1930, MRN [id number]  PCP:  Algis Greenhouse, MD  Cardiologist: Dr. Bettina Gavia / Dr. Angelena Form & Roxy Manns (TAVR)  CC: TOC s/p TAVR  History of Present Illness:  Carmen Blackwell is a 86 y.o. female with a history of HTN, HLD, BPPV, MALT lymphoma in remission,Sjogren's syndrome on low-dose chronic steroids, chronic leukopenia and thrombocytopenia secondary to Sjogren's syndrome, GERD, CKD stage IIIa and severe AS s/p TAVR (10/25/19) who presents to clinic for follow up.   Patient states that she was first noted to have a heart murmur on physical exam4-5 years ago.Echocardiogram revealed findings consistent with moderate aortic stenosis with preserved left ventricular systolic function and she has been followed ever since by Dr. Bettina Gavia. Recent follow-up echocardiogram revealed significant progression of disease with peak velocity across aortic valve measured 4.7 m/s corresponding to mean transvalvular gradient estimated 61 mmHg and aortic valve area calculated only 0.60 cm with DVI reported only 0.19. Left ventricular systolic function remains preserved with ejection fraction estimated 55 to 60%. Diagnostic cardiac catheterization performed October 12, 2019 revealed mild nonobstructive coronary artery disease. The aortic valve was not crossed to measure gradient. Right-sided pressures were normal.   He was evaluated by the multidisciplinary valve team and underwent successful TAVR with a10mm Edwards Sapien 3 UltraTHV via the TF approach on6/29/21. Post operative echo showed normal LV function with normally functioning TAVR with a mean gradient of 5.5 mm Hg and no PVL. She had a slight fever during admission with no localizing symptoms.  She was discharged on Asprin and Plavix.  Today she present to clinic for follow up. She  has had excessive bruising and self stopped the plavix. LE edema resolved and stopped lasix. No CP or SOB. No LE edema, orthopnea or PND. No dizziness or syncope. No blood in stool or urine. No palpitations. Having back pain that it causes her to loose her breath. She thinks it was related to the way a nurse positioned her in the bed. She feels like she needs massage therapy. Still has some mild fevers that she is watching.    Past Medical History:  Diagnosis Date   Acquired hypothyroidism 04/09/2015   Aortic stenosis    Atrophic vaginitis 12/30/2017   Formatting of this note might be different from the original. 2019: ERT vaginally   Bacterial food poisoning 07/20/2019   Formatting of this note might be different from the original. 2021   Benign positional vertigo 04/12/2019   Formatting of this note might be different from the original. 2916, 2020   Chronic kidney disease, stage III (moderate) 04/09/2015   Chronic right-sided low back pain without sciatica 08/07/2017   Cough 08/28/2015   Essential hypertension 08/28/2015   GERD (gastroesophageal reflux disease) 04/09/2015   HLD (hyperlipidemia)    Lumbosacral radiculopathy at L5 06/18/2018   Formatting of this note might be different from the original. 2020   MALT lymphoma ([place]) 10/10/2015   Osteopenia 06/02/2017   Pancytopenia ([place]) 10/10/2015   Primary osteoarthritis involving multiple joints 05/07/2018   Raynaud's disease without gangrene 07/20/2019   Formatting of this note might be different from the original. 2020: onset  Risk for falls 07/14/2016   S/P TAVR (transcatheter aortic valve replacement) 10/25/2019   s/p TAVR with a 26 mm Edwards S3U via the TF approach by Drs Angelena Form & Roxy Manns    Sciatica 06/02/2017   Severe aortic stenosis    Sjogren's syndrome ([place])    Throat burning 12/16/2017   Formatting of this note might be different from the original. 2019: chronic   Urge incontinence 05/06/2016    Past Surgical  History:  Procedure Laterality Date   ABDOMINAL HYSTERECTOMY     APPENDECTOMY     LUMBAR DISC SURGERY     RIGHT/LEFT HEART CATH AND CORONARY ANGIOGRAPHY N/A 10/12/2019   Procedure: RIGHT/LEFT HEART CATH AND CORONARY ANGIOGRAPHY;  Surgeon: Jettie Booze, MD;  Location: [facility];  Service: Cardiovascular;  Laterality: N/A;   TEE WITHOUT CARDIOVERSION N/A 10/25/2019   Procedure: TRANSESOPHAGEAL ECHOCARDIOGRAM (TEE);  Surgeon: Burnell Blanks, MD;  Location: [facility];  Service: Open Heart Surgery;  Laterality: N/A;   TONSILLECTOMY     TRANSCATHETER AORTIC VALVE REPLACEMENT, TRANSFEMORAL N/A 10/25/2019   Procedure: TRANSCATHETER AORTIC VALVE REPLACEMENT, TRANSFEMORAL;  Surgeon: Burnell Blanks, MD;  Location: [facility];  Service: Open Heart Surgery;  Laterality: N/A;    Current Medications: Outpatient Medications Prior to Visit  Medication Sig Dispense Refill   antiseptic oral rinse (BIOTENE) LIQD 15 mLs by Mouth Rinse route as needed for dry mouth. Mouth spray     aspirin 81 MG chewable tablet Chew 1 tablet (81 mg total) by mouth daily. 90 tablet 3   Calcium Carb-Cholecalciferol (CALCIUM 600+D3 PO) Take 1 capsule by mouth daily.     hydroxychloroquine (PLAQUENIL) 200 MG tablet Take 200 mg by mouth daily.      levothyroxine (SYNTHROID, LEVOTHROID) 50 MCG tablet Take 50 mcg by mouth daily before breakfast.      lovastatin (MEVACOR) 20 MG tablet TAKE 1 TABLET BY MOUTH EVERY DAY 90 tablet 1   Magnesium Gluconate 250 MG TABS Take 250 mg by mouth daily.      olopatadine (PATADAY) 0.1 % ophthalmic solution Place 1 drop into both eyes 2 (two) times daily.     potassium gluconate 595 (99 K) MG TABS tablet Take 595 mg by mouth daily.      predniSONE (DELTASONE) 5 MG tablet Take 5 mg by mouth daily.      traMADol (ULTRAM) 50 MG tablet Take 50 mg by mouth at bedtime.      Vaginal Lubricant (REPLENS) GEL Place 1 application vaginally See admin instructions. Every  3 days     clopidogrel (PLAVIX) 75 MG tablet Take 1 tablet (75 mg total) by mouth daily. (Patient not taking: Reported on 11/02/2019) 90 tablet 1   furosemide (LASIX) 20 MG tablet Take  20 mg Lasix on Monday, Wednesday and Friday. (Patient not taking: Reported on 11/02/2019) 30 tablet 1   No facility-administered medications prior to visit.     Allergies:   Nsaids, Oxybutynin, Requip [ropinirole hcl], and Ropinirole   Social History   Socioeconomic History   Marital status: Divorced    Spouse name: Not on file   Number of children: 1   Years of education: 13   Highest education level: Not on file  Occupational History   Occupation: Real Estate Agent-Retired  Tobacco Use   Smoking status: Never Smoker   Smokeless tobacco: Never Used  Scientific laboratory technician Use: Never used  Substance and Sexual Activity   Alcohol use: No   Drug  use: No   Sexual activity: Not on file  Other Topics Concern   Not on file  Social History Narrative   Lives alone in a 2 story home.  Has one child.  Retired.  Education: some college.    Social Determinants of Health   Financial Resource Strain:    Difficulty of Paying Living Expenses:   Food Insecurity:    Worried About Charity fundraiser in the Last Year:    Arboriculturist in the Last Year:   Transportation Needs:    Film/video editor (Medical):    Lack of Transportation (Non-Medical):   Physical Activity:    Days of Exercise per Week:    Minutes of Exercise per Session:   Stress:    Feeling of Stress :   Social Connections:    Frequency of Communication with Friends and Family:    Frequency of Social Gatherings with Friends and Family:    Attends Religious Services:    Active Member of Clubs or Organizations:    Attends Archivist Meetings:    Marital Status:      Family History:  The patient's family history includes Heart failure in her brother; Other in her mother.     ROS:   Please see the  history of present illness.    ROS All other systems reviewed and are negative.   PHYSICAL EXAM:   VS:  BP 108/60    Pulse 76    Ht 5' 7.5" (1.715 m)    Wt 107 lb 3.2 oz (48.6 kg)    BMI 16.54 kg/m    GEN: Well nourished, well developed, in no acute distress HEENT: normal Neck: no JVD or masses Cardiac: RRR; no murmurs, rubs, or gallops,no edema  Respiratory:  clear to auscultation bilaterally, normal work of breathing GI: soft, nontender, nondistended, + BS MS: no deformity or atrophy Skin: warm and dry, no rash.  Groin sites clear without hematoma. Diffuse ecchymosis down left thigh and on arms and legs.  Neuro:  Alert and Oriented x 3, Strength and sensation are intact Psych: euthymic mood, full affect   Wt Readings from Last 3 Encounters:  11/02/19 107 lb 3.2 oz (48.6 kg)  10/26/19 105 lb 4.8 oz (47.8 kg)  10/21/19 105 lb 13.1 oz (48 kg)      Studies/Labs Reviewed:   EKG:  EKG is ordered today.  The ekg ordered today demonstrates sinus HR 76  Recent Labs: 08/16/2019: NT-Pro BNP 481; TSH 2.340 10/21/2019: ALT 12; B Natriuretic Peptide 77.0 10/26/2019: BUN 15; Creatinine, Ser 0.98; Hemoglobin 10.9; Magnesium 2.0; Platelets 74; Potassium 4.1; Sodium 140   Lipid Panel    Component Value Date/Time   CHOL 170 08/16/2019 1357   TRIG 71 08/16/2019 1357   HDL 89 08/16/2019 1357   CHOLHDL 1.9 08/16/2019 1357   LDLCALC 68 08/16/2019 1357    Additional studies/ records that were reviewed today include:  TAVR OPERATIVE NOTE   Date of Procedure:10/25/2019  Preoperative Diagnosis:Severe Aortic Stenosis   Postoperative Diagnosis:Same   Procedure:   Transcatheter Aortic Valve Replacement - PercutaneousRightTransfemoral Approach Edwards Sapien 3 Ultra THV (size 3mm, model # 9750TFX, serial # [id number])  Co-Surgeons:Clarence H. Roxy Manns, MD and Lauree Chandler,  MD  Anesthesiologist:Thomas Fransisco Beau, MD  Echocardiographer:Mihai Croitoru, MD  Pre-operative Echo Findings: ? Severe aortic stenosis ? Normalleft ventricular systolic function  Post-operative Echo Findings: ? Noparavalvular leak ? Normalleft ventricular systolic function  ____________________   Echo6/30/21:  IMPRESSIONS  1.  The left ventricle has normal function. Elevated left atrial pressure.  2. There is no perivalvular leak. There is a 26 mm Edwards Sapien  prosthetic (TAVR) valve present in the aortic position. Procedure Date:  10/25/2019. Echo findings are consistent with normal structure and  function of the aortic valve prosthesis. Aortic  valve mean gradient measures 5.5 mmHg. Aortic valve Vmax measures 1.64  m/s.  3. There is normal pulmonary artery systolic pressure.  4. The inferior vena cava is dilated in size with >50% respiratory  variability, suggesting right atrial pressure of 8 mmHg.   Comparison(s): No significant change from prior study. Prior images  reviewed side by side.    ASSESSMENT & PLAN:   Severe AS s/p TAVR:groin sites healing well. ECG with no HAVB. She has been on aspirin and plavix, but discontinue plavix due to excessive bruising. Continue on aspirin indefinitely. SBE prophylaxis discussed; the patient is edentulous and does not go to the dentist. She has follow up with Dr. Bettina Gavia and echo in early August.   HTN: BP well controlled today. No changes made. Lasix changed to PRN as she has not needed it at all and self discontinued.   Incidental findings:   Small pulmonary nodules in the lungs measuring 4 mm or less in size, nonspecific, but statistically benign. No follow-up needed if patient is low-risk (and has no known or suspected primary neoplasm). Non-contrast chest CT can be considered in 12 months if patient is high-risk. She is a never smoker but does have a history of lymphoma. Will defer  to PCP on whether any follow up is recommended.   Fusiform infrarenal abdominal aortic aneurysm measuring up to 3.1 x 3.3 cm in diameter. Recommend followup by ultrasound in 3 years. This will be followed by her primary cardiologist. w   Medication Adjustments/Labs and Tests Ordered: Current medicines are reviewed at length with the patient today.  Concerns regarding medicines are outlined above.  Medication changes, Labs and Tests ordered today are listed in the Patient Instructions below. Patient Instructions  Medication Instructions:  1) DISCONTINUE Plavix 2) Take Furosemide 20mg  as needed with swelling  *If you need a refill on your cardiac medications before your next appointment, please call your pharmacy*   Lab Work: None If you have labs (blood work) drawn today and your tests are completely normal, you will receive your results only by:  [place] (if you have MyChart) OR  A paper copy in the mail If you have any lab test that is abnormal or we need to change your treatment, we will call you to review the results.   Testing/Procedures: None   Follow-Up: Keep currently scheduled follow up appointment with Dr. Bettina Gavia.    Other Instructions      Signed, Angelena Form, PA-C  11/02/2019 2:10 PM    [facility] [place], [place], [place]  [address] Phone: [phone]; Fax: [phone]

## 2019-11-02 ENCOUNTER — Encounter: Payer: Self-pay | Admitting: Physician Assistant

## 2019-11-02 ENCOUNTER — Other Ambulatory Visit: Payer: Self-pay

## 2019-11-02 ENCOUNTER — Ambulatory Visit (INDEPENDENT_AMBULATORY_CARE_PROVIDER_SITE_OTHER): Payer: Medicare HMO | Admitting: Physician Assistant

## 2019-11-02 VITALS — BP 108/60 | HR 76 | Ht 67.5 in | Wt 107.2 lb

## 2019-11-02 DIAGNOSIS — Z952 Presence of prosthetic heart valve: Secondary | ICD-10-CM | POA: Diagnosis not present

## 2019-11-02 DIAGNOSIS — I10 Essential (primary) hypertension: Secondary | ICD-10-CM

## 2019-11-02 DIAGNOSIS — I714 Abdominal aortic aneurysm, without rupture, unspecified: Secondary | ICD-10-CM

## 2019-11-02 DIAGNOSIS — R918 Other nonspecific abnormal finding of lung field: Secondary | ICD-10-CM | POA: Diagnosis not present

## 2019-11-02 MED ORDER — FUROSEMIDE 20 MG PO TABS
20.0000 mg | ORAL_TABLET | Freq: Every day | ORAL | 1 refills | Status: DC | PRN
Start: 2019-11-02 — End: 2020-01-13

## 2019-11-02 NOTE — Patient Instructions (Signed)
Medication Instructions:  1) DISCONTINUE Plavix 2) Take Furosemide 20mg  as needed with swelling  *If you need a refill on your cardiac medications before your next appointment, please call your pharmacy*   Lab Work: None If you have labs (blood work) drawn today and your tests are completely normal, you will receive your results only by: [facility] MyChart Message (if you have MyChart) OR . A paper copy in the mail If you have any lab test that is abnormal or we need to change your treatment, we will call you to review the results.   Testing/Procedures: None   Follow-Up: Keep currently scheduled follow up appointment with Dr. Bettina Gavia.    Other Instructions

## 2019-11-14 ENCOUNTER — Telehealth: Payer: Self-pay | Admitting: Physician Assistant

## 2019-11-14 NOTE — Telephone Encounter (Signed)
Reviewed meds with patient. Reiterated to her she was instructed to STOP PLAVIX at her last visit due to bruising and that Lasix is to be taken 20 mg once daily as needed for swelling. She was grateful for assistance.

## 2019-11-14 NOTE — Telephone Encounter (Signed)
Pt c/o medication issue:  1. Name of Medication:  furosemide (LASIX) 20 MG tablet plavix  2. How are you currently taking this medication (dosage and times per day)? Not currently taking  3. Are you having a reaction (difficulty breathing--STAT)? no  4. What is your medication issue? Patient would like a call back today about whether she is supposed to be taking both medications. She states she was given the plavix in the hospital.

## 2019-11-21 ENCOUNTER — Telehealth: Payer: Self-pay | Admitting: Cardiology

## 2019-11-21 NOTE — Telephone Encounter (Signed)
Called patient. Informed her she can increase her lasix to 20 mg twice daily until her appointment next Monday if she would like to. Advised her to let us know if something changes or gets worse. Patient verbally understood. No further questions.

## 2019-11-21 NOTE — Telephone Encounter (Signed)
Pt c/o swelling: STAT is pt has developed SOB within 24 hours  1) How much weight have you gained and in what time span? No weight gain   2) If swelling, where is the swelling located? Both legs (mainly the left leg)  3) Are you currently taking a fluid pill? Yes  4) Are you currently SOB? No  5) Do you have a log of your daily weights (if so, list)? No  6) Have you gained 3 pounds in a day or 5 pounds in a week? No  7) Have you traveled recently? No

## 2019-11-21 NOTE — Telephone Encounter (Signed)
I reviewed her records it says that her office visit 11/02/2019 that she stopped furosemide, says on a phone call afterward she takes it as needed.  She should be taking 20 mg daily if she is not she should start if she has had to go to twice daily until she is seen by me in the office as scheduled 11/28/2019

## 2019-11-21 NOTE — Telephone Encounter (Signed)
Called and spoke to patient. She repots she has had bilateral leg swelling for a few weeks now that isn't getting better. She denies pain in her legs but does report they are heavy. Temperature goes back and forth she says from cold to hot. She is currently taking lasix 20 mg daily. She denies shortness of breath or weight gain. She would like to be seen. Will consult with Dr. Bettina Gavia and see how soon he would like her to be scheduled.

## 2019-11-24 ENCOUNTER — Telehealth: Payer: Self-pay | Admitting: Physician Assistant

## 2019-11-24 NOTE — Telephone Encounter (Signed)
Carmen Blackwell is calling requesting to speak with Curt Bears in regards to starting the physical therapy they were previously discussing due to her having a hard time getting around. Please advise.

## 2019-11-24 NOTE — Telephone Encounter (Signed)
Spoke with the patient, who requests home PT for strengthening since she is having trouble "getting around." She will talk with Dr. Bettina Gavia on Monday about PT in her area. If he will not order for her, Nell Range will order. She was grateful for call.

## 2019-11-27 NOTE — Progress Notes (Addendum)
Cardiology Office Note:    Date:  11/28/2019   ID:  Carmen Blackwell, DOB [birth year], MRN [id number]  PCP:  Algis Greenhouse, MD  Cardiologist:  Shirlee More, MD    Referring MD: Algis Greenhouse, MD    ASSESSMENT:    1. S/P TAVR (transcatheter aortic valve replacement)   2. Hypertensive heart disease with chronic diastolic congestive heart failure (HCC)   3. Stage 3 chronic kidney disease, unspecified whether stage 3a or 3b CKD   4. Pure hypercholesterolemia    PLAN:    In order of problems listed above:  1. She is struggling to recover after TAVR. She has NYHA class II symptoms.  I think her greatest problem now is generalized weakness and frailty we will go ahead and get a home health aide out to help her. 2. Presently not taking a diuretic no fluid overload stable 3. Recheck labs today 4. Continue her low intensity statin 5. And with chronic venous insufficiency I asked her to wear support hose again   Next appointment: 4 weeks   Medication Adjustments/Labs and Tests Ordered: Current medicines are reviewed at length with the patient today.  Concerns regarding medicines are outlined above.  Orders Placed This Encounter  Procedures  . CBC  . Basic metabolic panel  . Pro b natriuretic peptide (BNP)  . TSH   No orders of the defined types were placed in this encounter.   Chief Complaint  Patient presents with  . Follow-up    After TAVR    History of Present Illness:    Carmen Blackwell is a 86 y.o. female with a hx of aortic stenosis with hypertension and stage III CKD and recent TAVR 10/25/2019.  She was last seen by me 09/12/2019.  Since TAVR she has had trouble with edema and heart failure.  She had an echocardiogram performed by the structural heart team 10/26/2019 that I independently reviewed that shows normal TAVR function normal left ventricular size and function and normal pulmonary artery pressures. Compliance with diet, lifestyle and medications: Yes  She  does not feel well her daughter has left she feels incapable of taking care of her self and tells me she is disappointed in me because she was given the impression I had been arranging home health for her and has not occurred..  Is the first time of her to this issue.  She complains of feeling weak she has diffuse ecchymoses and she has chronic venous insufficiency.  She is not taking furosemide.  She is taking low-dose aspirin.  No shortness of breath chest pain or palpitation. Past Medical History:  Diagnosis Date  . Acquired hypothyroidism 04/09/2015  . Aortic stenosis   . Atrophic vaginitis 12/30/2017   Formatting of this note might be different from the original. 2019: ERT vaginally  . Bacterial food poisoning 07/20/2019   Formatting of this note might be different from the original. 2021  . Benign positional vertigo 04/12/2019   Formatting of this note might be different from the original. 2916, 2020  . Chronic kidney disease, stage III (moderate) 04/09/2015  . Chronic right-sided low back pain without sciatica 08/07/2017  . Cough 08/28/2015  . Essential hypertension 08/28/2015  . GERD (gastroesophageal reflux disease) 04/09/2015  . HLD (hyperlipidemia)   . Lumbosacral radiculopathy at L5 06/18/2018   Formatting of this note might be different from the original. 2020  . MALT lymphoma ([place]) 10/10/2015  . Osteopenia 06/02/2017  . Pancytopenia ([place]) 10/10/2015  . Primary osteoarthritis involving  multiple joints 05/07/2018  . Raynaud's disease without gangrene 07/20/2019   Formatting of this note might be different from the original. 2020: onset  . Risk for falls 07/14/2016  . S/P TAVR (transcatheter aortic valve replacement) 10/25/2019   s/p TAVR with a 26 mm Edwards S3U via the TF approach by Drs Angelena Form & Roxy Manns   . Sciatica 06/02/2017  . Severe aortic stenosis   . Sjogren's syndrome ([place])   . Throat burning 12/16/2017   Formatting of this note might be different from the original. 2019: chronic    . Urge incontinence 05/06/2016    Past Surgical History:  Procedure Laterality Date  . ABDOMINAL HYSTERECTOMY    . APPENDECTOMY    . LUMBAR DISC SURGERY    . RIGHT/LEFT HEART CATH AND CORONARY ANGIOGRAPHY N/A 10/12/2019   Procedure: RIGHT/LEFT HEART CATH AND CORONARY ANGIOGRAPHY;  Surgeon: Jettie Booze, MD;  Location: [facility];  Service: Cardiovascular;  Laterality: N/A;  . TEE WITHOUT CARDIOVERSION N/A 10/25/2019   Procedure: TRANSESOPHAGEAL ECHOCARDIOGRAM (TEE);  Surgeon: Burnell Blanks, MD;  Location: [facility];  Service: Open Heart Surgery;  Laterality: N/A;  . TONSILLECTOMY    . TRANSCATHETER AORTIC VALVE REPLACEMENT, TRANSFEMORAL N/A 10/25/2019   Procedure: TRANSCATHETER AORTIC VALVE REPLACEMENT, TRANSFEMORAL;  Surgeon: Burnell Blanks, MD;  Location: [facility];  Service: Open Heart Surgery;  Laterality: N/A;    Current Medications: Current Meds  Medication Sig  . antiseptic oral rinse (BIOTENE) LIQD 15 mLs by Mouth Rinse route as needed for dry mouth. Mouth spray  . aspirin 81 MG chewable tablet Chew 1 tablet (81 mg total) by mouth daily.  . Calcium Carb-Cholecalciferol (CALCIUM 600+D3 PO) Take 1 capsule by mouth daily.  . furosemide (LASIX) 20 MG tablet Take 1 tablet (20 mg total) by mouth daily as needed.  . hydroxychloroquine (PLAQUENIL) 200 MG tablet Take 200 mg by mouth daily.   Marland Kitchen levothyroxine (SYNTHROID, LEVOTHROID) 50 MCG tablet Take 50 mcg by mouth daily before breakfast.   . lovastatin (MEVACOR) 20 MG tablet TAKE 1 TABLET BY MOUTH EVERY DAY  . Magnesium Gluconate 250 MG TABS Take 250 mg by mouth daily.   Marland Kitchen olopatadine (PATADAY) 0.1 % ophthalmic solution Place 1 drop into both eyes 2 (two) times daily.  . potassium gluconate 595 (99 K) MG TABS tablet Take 595 mg by mouth daily.   . predniSONE (DELTASONE) 5 MG tablet Take 5 mg by mouth daily.   . traMADol (ULTRAM) 50 MG tablet Take 50 mg by mouth at bedtime.   . Vaginal Lubricant (REPLENS) GEL  Place 1 application vaginally See admin instructions. Every 3 days     Allergies:   Nsaids, Oxybutynin, Requip [ropinirole hcl], and Ropinirole   Social History   Socioeconomic History  . Marital status: Divorced    Spouse name: Not on file  . Number of children: 1  . Years of education: 35  . Highest education level: Not on file  Occupational History  . Occupation: Real Estate Agent-Retired  Tobacco Use  . Smoking status: Never Smoker  . Smokeless tobacco: Never Used  Vaping Use  . Vaping Use: Never used  Substance and Sexual Activity  . Alcohol use: No  . Drug use: No  . Sexual activity: Not on file  Other Topics Concern  . Not on file  Social History Narrative   Lives alone in a 2 story home.  Has one child.  Retired.  Education: some college.    Social  Determinants of Health   Financial Resource Strain:   . Difficulty of Paying Living Expenses:   Food Insecurity:   . Worried About Charity fundraiser in the Last Year:   . Arboriculturist in the Last Year:   Transportation Needs:   . Film/video editor (Medical):   Marland Kitchen Lack of Transportation (Non-Medical):   Physical Activity:   . Days of Exercise per Week:   . Minutes of Exercise per Session:   Stress:   . Feeling of Stress :   Social Connections:   . Frequency of Communication with Friends and Family:   . Frequency of Social Gatherings with Friends and Family:   . Attends Religious Services:   . Active Member of Clubs or Organizations:   . Attends Archivist Meetings:   Marland Kitchen Marital Status:      Family History: The patient's family history includes Heart failure in her brother; Other in her mother. There is no history of CAD, Heart attack, or Heart disease. ROS:   Please see the history of present illness.    All other systems reviewed and are negative.  EKGs/Labs/Other Studies Reviewed:    The following studies were reviewed today:    Recent Labs: 08/16/2019: NT-Pro BNP 481; TSH  2.340 10/21/2019: ALT 12; B Natriuretic Peptide 77.0 10/26/2019: BUN 15; Creatinine, Ser 0.98; Hemoglobin 10.9; Magnesium 2.0; Platelets 74; Potassium 4.1; Sodium 140  Recent Lipid Panel    Component Value Date/Time   CHOL 170 08/16/2019 1357   TRIG 71 08/16/2019 1357   HDL 89 08/16/2019 1357   CHOLHDL 1.9 08/16/2019 1357   LDLCALC 68 08/16/2019 1357    Physical Exam:    VS:  BP 120/70 (BP Location: Left Arm, Patient Position: Sitting, Cuff Size: Small)   Pulse 75   Ht 5' 7.5" (1.715 m)   Wt 105 lb (47.6 kg)   SpO2 97%   BMI 16.20 kg/m     Wt Readings from Last 3 Encounters:  11/28/19 105 lb (47.6 kg)  11/02/19 107 lb 3.2 oz (48.6 kg)  10/26/19 105 lb 4.8 oz (47.8 kg)     GEN: She looks very frail well nourished, well developed in no acute distress HEENT: Normal NECK: No JVD; No carotid bruits LYMPHATICS: No lymphadenopathy CARDIAC: RRR, no murmurs, rubs, gallops RESPIRATORY:  Clear to auscultation without rales, wheezing or rhonchi  ABDOMEN: Soft, non-tender, non-distended MUSCULOSKELETAL:  No edema; No deformity she has marked ecchymoses lower extremities and arms and dependent rubor, the feet are warm there are no ischemic SKIN: Warm and dry NEUROLOGIC:  Alert and oriented x 3 PSYCHIATRIC:  Normal affect    Signed, Shirlee More, MD  11/28/2019 1:42 PM    [facility]

## 2019-11-28 ENCOUNTER — Encounter: Payer: Self-pay | Admitting: Cardiology

## 2019-11-28 ENCOUNTER — Ambulatory Visit (INDEPENDENT_AMBULATORY_CARE_PROVIDER_SITE_OTHER): Payer: Medicare HMO

## 2019-11-28 ENCOUNTER — Other Ambulatory Visit: Payer: Self-pay

## 2019-11-28 ENCOUNTER — Ambulatory Visit (INDEPENDENT_AMBULATORY_CARE_PROVIDER_SITE_OTHER): Payer: Medicare HMO | Admitting: Cardiology

## 2019-11-28 VITALS — BP 120/70 | HR 75 | Ht 67.5 in | Wt 105.0 lb

## 2019-11-28 DIAGNOSIS — I110 Hypertensive heart disease with heart failure: Secondary | ICD-10-CM | POA: Diagnosis not present

## 2019-11-28 DIAGNOSIS — I5032 Chronic diastolic (congestive) heart failure: Secondary | ICD-10-CM

## 2019-11-28 DIAGNOSIS — Z952 Presence of prosthetic heart valve: Secondary | ICD-10-CM

## 2019-11-28 DIAGNOSIS — N183 Chronic kidney disease, stage 3 unspecified: Secondary | ICD-10-CM

## 2019-11-28 DIAGNOSIS — E7800 Pure hypercholesterolemia, unspecified: Secondary | ICD-10-CM | POA: Diagnosis not present

## 2019-11-28 LAB — ECHOCARDIOGRAM COMPLETE
AR max vel: 1.01 cm2
AV Area VTI: 1.18 cm2
AV Area mean vel: 1.16 cm2
AV Mean grad: 12 mmHg
AV Peak grad: 22.5 mmHg
Ao pk vel: 2.37 m/s
Area-P 1/2: 2.26 cm2
Height: 67.5 in
S' Lateral: 2.1 cm
Weight: 1680 oz

## 2019-11-28 NOTE — Progress Notes (Signed)
Complete echocardiogram performed.  Jimmy Tersea Aulds RDCS, RVT  

## 2019-11-28 NOTE — Patient Instructions (Signed)
Medication Instructions:  Your physician recommends that you continue on your current medications as directed. Please refer to the Current Medication list given to you today.  *If you need a refill on your cardiac medications before your next appointment, please call your pharmacy*   Lab Work: Your physician recommends that you return for lab work in: TODAY CBC, BMP, TSH, ProBNP If you have labs (blood work) drawn today and your tests are completely normal, you will receive your results only by: Marland Kitchen MyChart Message (if you have MyChart) OR . A paper copy in the mail If you have any lab test that is abnormal or we need to change your treatment, we will call you to review the results.   Testing/Procedures: None   Follow-Up: At [facility], you and your health needs are our priority.  As part of our continuing mission to provide you with exceptional heart care, we have created designated Provider Care Teams.  These Care Teams include your primary Cardiologist (physician) and Advanced Practice Providers (APPs -  Physician Assistants and Nurse Practitioners) who all work together to provide you with the care you need, when you need it.  We recommend signing up for the patient portal called "MyChart".  Sign up information is provided on this After Visit Summary.  MyChart is used to connect with patients for Virtual Visits (Telemedicine).  Patients are able to view lab/test results, encounter notes, upcoming appointments, etc.  Non-urgent messages can be sent to your provider as well.   To learn more about what you can do with MyChart, go to [email or web address].    Your next appointment:   4 week(s)  The format for your next appointment:   In Person  Provider:   Shirlee More, MD   Other Instructions

## 2019-11-29 ENCOUNTER — Telehealth: Payer: Self-pay | Admitting: Cardiology

## 2019-11-29 ENCOUNTER — Telehealth: Payer: Self-pay

## 2019-11-29 LAB — BASIC METABOLIC PANEL
BUN/Creatinine Ratio: 20 (ref 12–28)
BUN: 22 mg/dL (ref 8–27)
CO2: 25 mmol/L (ref 20–29)
Calcium: 9.5 mg/dL (ref 8.7–10.3)
Chloride: 97 mmol/L (ref 96–106)
Creatinine, Ser: 1.09 mg/dL — ABNORMAL HIGH (ref 0.57–1.00)
GFR calc Af Amer: 53 mL/min/{1.73_m2} — ABNORMAL LOW (ref >59)
GFR calc non Af Amer: 46 mL/min/{1.73_m2} — ABNORMAL LOW (ref >59)
Glucose: 94 mg/dL (ref 65–99)
Potassium: 5 mmol/L (ref 3.5–5.2)
Sodium: 137 mmol/L (ref 134–144)

## 2019-11-29 LAB — CBC
Hematocrit: 32.9 % — ABNORMAL LOW (ref 34.0–46.6)
Hemoglobin: 11.2 g/dL (ref 11.1–15.9)
MCH: 30.4 pg (ref 26.6–33.0)
MCHC: 34 g/dL (ref 31.5–35.7)
MCV: 89 fL (ref 79–97)
Platelets: 91 10*3/uL — CL (ref 150–450)
RBC: 3.69 x10E6/uL — ABNORMAL LOW (ref 3.77–5.28)
RDW: 11.8 % (ref 11.7–15.4)
WBC: 5.7 10*3/uL (ref 3.4–10.8)

## 2019-11-29 LAB — PRO B NATRIURETIC PEPTIDE: NT-Pro BNP: 467 pg/mL (ref 0–738)

## 2019-11-29 LAB — TSH: TSH: 1.45 u[IU]/mL (ref 0.450–4.500)

## 2019-11-29 NOTE — Telephone Encounter (Signed)
Spoke with patient regarding results and recommendation.  Patient verbalizes understanding and is agreeable to plan of care. Advised patient to call back with any issues or concerns.  

## 2019-11-29 NOTE — Telephone Encounter (Signed)
    Tillie Rung from [facility] calling. Transferred to [employer]

## 2019-11-29 NOTE — Telephone Encounter (Signed)
-----   Message from Richardo Priest, MD sent at 11/29/2019  7:26 AM EDT ----- Normal or stable result  No changes

## 2019-11-30 ENCOUNTER — Telehealth: Payer: Self-pay | Admitting: Cardiology

## 2019-11-30 NOTE — Telephone Encounter (Signed)
Carmen Blackwell from [facility] returning Carmen Blackwell's call regarding home health orders. Transferred call to [facility].

## 2019-11-30 NOTE — Telephone Encounter (Signed)
I spoke with [place].  They received referral from Dr Bettina Gavia.  Patient is going to have a therapy evaluation.  Christy reports nursing would see patient three times per month for 2 months.  Alyse Low needs a call back with verbal orders if this plan is OK with Dr Bettina Gavia

## 2019-11-30 NOTE — Telephone Encounter (Signed)
Christy from [facility] calling for verbal nursing orders for 3 times a month for 2 months.

## 2019-11-30 NOTE — Telephone Encounter (Signed)
I spoke with Carmen Blackwell and gave her verbal orders from Dr Bettina Gavia

## 2019-11-30 NOTE — Telephone Encounter (Signed)
Yes please ask him to start immediately she is socially isolated and I appreciate them assisting her

## 2019-11-30 NOTE — Telephone Encounter (Signed)
Left message to call back  

## 2019-12-07 ENCOUNTER — Telehealth: Payer: Self-pay | Admitting: Cardiology

## 2019-12-07 ENCOUNTER — Other Ambulatory Visit: Payer: Self-pay | Admitting: Physician Assistant

## 2019-12-07 NOTE — Telephone Encounter (Signed)
Follow Up:    Home Health nurse is checking on the status of the order. She was at the pt's home.

## 2019-12-07 NOTE — Telephone Encounter (Signed)
Laveda Abbe from [facility] called. He wanted to get approval for two visits weekly for 2 weeks then one visit weekly for the following 6 weeks for gait training and balance, transfers training, Home Health Education and Safety Education. Please let Laveda Abbe know what Dr. Bettina Gavia decides

## 2019-12-08 ENCOUNTER — Telehealth: Payer: Self-pay | Admitting: Cardiology

## 2019-12-08 NOTE — Telephone Encounter (Signed)
Left a message for Carmen Blackwell letting him know that per Dr. Bettina Gavia these orders were fine. I also gave him our call back number to call back with any questions or concerns as well.

## 2019-12-08 NOTE — Telephone Encounter (Signed)
Carmen Blackwell, from [facility] is calling to get verbal orders for home PT for this patient.

## 2019-12-08 NOTE — Telephone Encounter (Signed)
Verbal order given for PT for 2 times weekly for 2 weeks then 1 time weekly for 5 weeks .Adonis Housekeeper

## 2019-12-08 NOTE — Telephone Encounter (Signed)
Yes

## 2019-12-16 ENCOUNTER — Telehealth: Payer: Self-pay | Admitting: Cardiology

## 2019-12-16 NOTE — Telephone Encounter (Signed)
Forms re-faxed at this time.

## 2019-12-16 NOTE — Telephone Encounter (Signed)
New message:      Maudie Mercury from [facility] calling they need form sent back it is missing doctors information need ASAP.  Number [phone]

## 2019-12-23 ENCOUNTER — Other Ambulatory Visit: Payer: Self-pay | Admitting: Cardiology

## 2019-12-26 ENCOUNTER — Ambulatory Visit: Payer: Medicare HMO | Admitting: Cardiology

## 2019-12-27 DIAGNOSIS — E785 Hyperlipidemia, unspecified: Secondary | ICD-10-CM | POA: Insufficient documentation

## 2019-12-27 DIAGNOSIS — I350 Nonrheumatic aortic (valve) stenosis: Secondary | ICD-10-CM | POA: Insufficient documentation

## 2019-12-28 ENCOUNTER — Other Ambulatory Visit: Payer: Self-pay

## 2019-12-28 ENCOUNTER — Encounter: Payer: Self-pay | Admitting: Cardiology

## 2019-12-28 ENCOUNTER — Ambulatory Visit: Payer: Medicare HMO | Admitting: Cardiology

## 2019-12-28 VITALS — BP 111/54 | HR 74 | Ht 67.5 in | Wt 105.4 lb

## 2019-12-28 DIAGNOSIS — Z952 Presence of prosthetic heart valve: Secondary | ICD-10-CM

## 2019-12-28 DIAGNOSIS — E7800 Pure hypercholesterolemia, unspecified: Secondary | ICD-10-CM

## 2019-12-28 DIAGNOSIS — I5032 Chronic diastolic (congestive) heart failure: Secondary | ICD-10-CM

## 2019-12-28 DIAGNOSIS — N183 Chronic kidney disease, stage 3 unspecified: Secondary | ICD-10-CM

## 2019-12-28 DIAGNOSIS — D696 Thrombocytopenia, unspecified: Secondary | ICD-10-CM

## 2019-12-28 DIAGNOSIS — I110 Hypertensive heart disease with heart failure: Secondary | ICD-10-CM

## 2019-12-28 NOTE — Patient Instructions (Signed)
Medication Instructions:  Your physician recommends that you continue on your current medications as directed. Please refer to the Current Medication list given to you today.  *If you need a refill on your cardiac medications before your next appointment, please call your pharmacy*   Lab Work: None If you have labs (blood work) drawn today and your tests are completely normal, you will receive your results only by: Marland Kitchen MyChart Message (if you have MyChart) OR . A paper copy in the mail If you have any lab test that is abnormal or we need to change your treatment, we will call you to review the results.   Testing/Procedures: None   Follow-Up: At [facility], you and your health needs are our priority.  As part of our continuing mission to provide you with exceptional heart care, we have created designated Provider Care Teams.  These Care Teams include your primary Cardiologist (physician) and Advanced Practice Providers (APPs -  Physician Assistants and Nurse Practitioners) who all work together to provide you with the care you need, when you need it.  We recommend signing up for the patient portal called "MyChart".  Sign up information is provided on this After Visit Summary.  MyChart is used to connect with patients for Virtual Visits (Telemedicine).  Patients are able to view lab/test results, encounter notes, upcoming appointments, etc.  Non-urgent messages can be sent to your provider as well.   To learn more about what you can do with MyChart, go to [email or web address].    Your next appointment:   3 month(s)  The format for your next appointment:   In Person  Provider:   Shirlee More, MD   Other Instructions Please use support hose!

## 2019-12-28 NOTE — Progress Notes (Signed)
Cardiology Office Note:    Date:  12/28/2019   ID:  Carmen Blackwell, DOB November 22, 1930, MRN [id number]  PCP:  Patient, No Pcp Per  Cardiologist:  Shirlee More, MD    Referring MD: Algis Greenhouse, MD    ASSESSMENT:    1. S/P TAVR (transcatheter aortic valve replacement)   2. Hypertensive heart disease with chronic diastolic congestive heart failure (HCC)   3. Stage 3 chronic kidney disease, unspecified whether stage 3a or 3b CKD   4. Pure hypercholesterolemia   5. Thrombocytopenia ([place])    PLAN:    In order of problems listed above:  1. Stable from a cardiac perspective but not doing well there are a lot of issues including living in isolated fashion and overall debilitation and frailty.  She will continue with PT and OT.  She inquired about a wheelchair scooter and I told her it is outside the scope of cardiology. 2. Stable plan encouraged her not to take diuretic as I think it makes her Raynaud's worse. 3. Stable kidney disease recent labs 4. She takes a low intensity statin recommended in patients with a yes 5. Stable and mild recent labs   Next appointment: I will see her in the office in 3 months   Medication Adjustments/Labs and Tests Ordered: Current medicines are reviewed at length with the patient today.  Concerns regarding medicines are outlined above.  No orders of the defined types were placed in this encounter.  No orders of the defined types were placed in this encounter.   No chief complaint on file.   History of Present Illness:    Carmen Blackwell is a 86 y.o. female with a hx of aortic stenosis with hypertension and stage III CKD and recent TAVR 10/25/2019 last seen 11/28/2019 both her cardiology follow-up and what was transition of care visit arranging for home health care.. Her preoperative CT imaging of the aorta showed a very small abdominal aortic aneurysm 31 x 33 mm.  Post TAVR she had edema despite a proBNP level that was not elevated.  Other labs show  normal hemoglobin 11.2 but she has chronic thrombocytopenia platelets 91,000 and CKD her last GFR 46 cc creatinine 1.09 stage III. Compliance with diet, lifestyle and medications:  Yes  She is seen in follow-up and has home health arranged still finds her self to be weak and is a bit desponded and disappointed she is not getting support from her daughter.  She has decided not to accept Covid vaccine I tried to give her education and she is firm in her conviction.  She asked me about having serology checked and I told her that I think it was any benefit to her.  She is taking a diuretic as needed still has blood pressures of 100 210 at home but I would not put her on alpha agonist with her Raynaud's.  She complains bitterly of cold feet she has strong pulses dorsalis pedis posterior tibial bilaterally and has no ischemia.  She is cool from the mid calf down and has stasis changes.  I encouraged her only to take the diuretic when absolutely necessary.  No chest pain orthopnea syncope. Past Medical History:  Diagnosis Date  . Acquired hypothyroidism 04/09/2015  . Aortic stenosis   . Atrophic vaginitis 12/30/2017   Formatting of this note might be different from the original. 2019: ERT vaginally  . Bacterial food poisoning 07/20/2019   Formatting of this note might be different from the original. 2021  .  Benign positional vertigo 04/12/2019   Formatting of this note might be different from the original. 2916, 2020  . Chronic kidney disease, stage III (moderate) 04/09/2015  . Chronic right-sided low back pain without sciatica 08/07/2017  . Cough 08/28/2015  . Essential hypertension 08/28/2015  . GERD (gastroesophageal reflux disease) 04/09/2015  . HLD (hyperlipidemia)   . Lumbosacral radiculopathy at L5 06/18/2018   Formatting of this note might be different from the original. 2020  . MALT lymphoma ([place]) 10/10/2015  . Osteopenia 06/02/2017  . Pancytopenia ([place]) 10/10/2015  . Primary osteoarthritis involving  multiple joints 05/07/2018  . Raynaud's disease without gangrene 07/20/2019   Formatting of this note might be different from the original. 2020: onset  . Risk for falls 07/14/2016  . S/P TAVR (transcatheter aortic valve replacement) 10/25/2019   s/p TAVR with a 26 mm Edwards S3U via the TF approach by Drs Angelena Form & Roxy Manns   . Sciatica 06/02/2017  . Severe aortic stenosis   . Sjogren's syndrome ([place])   . Throat burning 12/16/2017   Formatting of this note might be different from the original. 2019: chronic  . Urge incontinence 05/06/2016    Past Surgical History:  Procedure Laterality Date  . ABDOMINAL HYSTERECTOMY    . APPENDECTOMY    . LUMBAR DISC SURGERY    . RIGHT/LEFT HEART CATH AND CORONARY ANGIOGRAPHY N/A 10/12/2019   Procedure: RIGHT/LEFT HEART CATH AND CORONARY ANGIOGRAPHY;  Surgeon: Jettie Booze, MD;  Location: [place] [facility];  Service: Cardiovascular;  Laterality: N/A;  . TEE WITHOUT CARDIOVERSION N/A 10/25/2019   Procedure: TRANSESOPHAGEAL ECHOCARDIOGRAM (TEE);  Surgeon: Burnell Blanks, MD;  Location: [place];  Service: Open Heart Surgery;  Laterality: N/A;  . TONSILLECTOMY    . TRANSCATHETER AORTIC VALVE REPLACEMENT, TRANSFEMORAL N/A 10/25/2019   Procedure: TRANSCATHETER AORTIC VALVE REPLACEMENT, TRANSFEMORAL;  Surgeon: Burnell Blanks, MD;  Location: [place];  Service: Open Heart Surgery;  Laterality: N/A;    Current Medications: Current Meds  Medication Sig  . antiseptic oral rinse (BIOTENE) LIQD 15 mLs by Mouth Rinse route as needed for dry mouth. Mouth spray  . aspirin 81 MG chewable tablet Chew 1 tablet (81 mg total) by mouth daily.  . Calcium Carb-Cholecalciferol (CALCIUM 600+D3 PO) Take 1 capsule by mouth daily.  . furosemide (LASIX) 20 MG tablet Take 1 tablet (20 mg total) by mouth daily as needed.  . hydroxychloroquine (PLAQUENIL) 200 MG tablet Take 200 mg by mouth daily.   Marland Kitchen levothyroxine (SYNTHROID, LEVOTHROID) 50 MCG tablet Take 50 mcg by  mouth daily before breakfast.   . lovastatin (MEVACOR) 20 MG tablet TAKE 1 TABLET BY MOUTH EVERY DAY  . Magnesium Gluconate 250 MG TABS Take 250 mg by mouth daily.   Marland Kitchen olopatadine (PATADAY) 0.1 % ophthalmic solution Place 1 drop into both eyes 2 (two) times daily.  . potassium gluconate 595 (99 K) MG TABS tablet Take 595 mg by mouth daily. TAKES 1 TABLET DAILY  . predniSONE (DELTASONE) 5 MG tablet Take 5 mg by mouth daily.   . traMADol (ULTRAM) 50 MG tablet Take 50 mg by mouth at bedtime.   . Vaginal Lubricant (REPLENS) GEL Place 1 application vaginally See admin instructions. Every 3 days     Allergies:   Nsaids, Oxybutynin, Requip [ropinirole hcl], and Ropinirole   Social History   Socioeconomic History  . Marital status: Divorced    Spouse name: Not on file  . Number of children: 1  . Years of education:  13  . Highest education level: Not on file  Occupational History  . Occupation: Real Estate Agent-Retired  Tobacco Use  . Smoking status: Never Smoker  . Smokeless tobacco: Never Used  Vaping Use  . Vaping Use: Never used  Substance and Sexual Activity  . Alcohol use: No  . Drug use: No  . Sexual activity: Not on file  Other Topics Concern  . Not on file  Social History Narrative   Lives alone in a 2 story home.  Has one child.  Retired.  Education: some college.    Social Determinants of Health   Financial Resource Strain:   . Difficulty of Paying Living Expenses: Not on file  Food Insecurity:   . Worried About Charity fundraiser in the Last Year: Not on file  . Ran Out of Food in the Last Year: Not on file  Transportation Needs:   . Lack of Transportation (Medical): Not on file  . Lack of Transportation (Non-Medical): Not on file  Physical Activity:   . Days of Exercise per Week: Not on file  . Minutes of Exercise per Session: Not on file  Stress:   . Feeling of Stress : Not on file  Social Connections:   . Frequency of Communication with Friends and Family:  Not on file  . Frequency of Social Gatherings with Friends and Family: Not on file  . Attends Religious Services: Not on file  . Active Member of Clubs or Organizations: Not on file  . Attends Archivist Meetings: Not on file  . Marital Status: Not on file     Family History: The patient's family history includes Heart failure in her brother; Other in her mother. There is no history of CAD, Heart attack, or Heart disease. ROS:   Please see the history of present illness.    All other systems reviewed and are negative.  EKGs/Labs/Other Studies Reviewed:    The following studies were reviewed today:    Recent Labs: 10/21/2019: ALT 12; B Natriuretic Peptide 77.0 10/26/2019: Magnesium 2.0 11/28/2019: BUN 22; Creatinine, Ser 1.09; Hemoglobin 11.2; NT-Pro BNP 467; Platelets 91; Potassium 5.0; Sodium 137; TSH 1.450  Recent Lipid Panel    Component Value Date/Time   CHOL 170 08/16/2019 1357   TRIG 71 08/16/2019 1357   HDL 89 08/16/2019 1357   CHOLHDL 1.9 08/16/2019 1357   LDLCALC 68 08/16/2019 1357    Physical Exam:    VS:  BP (!) 111/54   Pulse 74   Ht 5' 7.5" (1.715 m)   Wt 105 lb 6.4 oz (47.8 kg)   SpO2 99%   BMI 16.26 kg/m     Wt Readings from Last 3 Encounters:  12/28/19 105 lb 6.4 oz (47.8 kg)  11/28/19 105 lb (47.6 kg)  11/02/19 107 lb 3.2 oz (48.6 kg)     GEN: She appears quite frail but is actually improved from her last visit well nourished, well developed in no acute distress HEENT: Normal NECK: No JVD; No carotid bruits LYMPHATICS: No lymphadenopathy CARDIAC: RRR, no murmurs, rubs, gallops RESPIRATORY:  Clear to auscultation without rales, wheezing or rhonchi  ABDOMEN: Soft, non-tender, non-distended MUSCULOSKELETAL:  No edema; No deformity her lower extremities are cool from the mid calf down dusky no ischemic changes and she has strong dorsalis pedis and posterior tibial pulses bilaterally SKIN: Warm and dry NEUROLOGIC:  Alert and oriented x  3 PSYCHIATRIC:  Normal affect    Signed, Shirlee More, MD  12/28/2019  11:19 AM    [facility]

## 2020-01-11 ENCOUNTER — Telehealth: Payer: Self-pay | Admitting: Cardiology

## 2020-01-11 NOTE — Telephone Encounter (Signed)
New Message:   She needs a verbal order for Physical Therapy for 1 time a week for 7 weeks please.

## 2020-01-11 NOTE — Telephone Encounter (Signed)
Yes

## 2020-01-12 NOTE — Telephone Encounter (Signed)
Spoke to Carmen Blackwell just now and let her know that Dr. Bettina Gavia said this was fine. She verbalizes understanding and thanks me for the call back.

## 2020-01-13 ENCOUNTER — Other Ambulatory Visit: Payer: Self-pay | Admitting: Physician Assistant

## 2020-01-13 ENCOUNTER — Telehealth: Payer: Self-pay | Admitting: Cardiology

## 2020-01-13 NOTE — Telephone Encounter (Signed)
Carmen Blackwell is calling to get verbal orders to continue this patients Occupational Therapy. Please advise.

## 2020-01-13 NOTE — Telephone Encounter (Signed)
Spoke to Legrand Como just now and let him know that per Dr. Joya Gaskins verbal order this would be fine. He verbalizes understanding and thanks me for the call back.

## 2020-01-24 ENCOUNTER — Telehealth: Payer: Self-pay | Admitting: Cardiology

## 2020-01-24 NOTE — Telephone Encounter (Signed)
Yes resume

## 2020-01-24 NOTE — Telephone Encounter (Signed)
Pt c/o medication issue:  1. Name of Medication: clopidogrel (PLAVIX) 75 MG tablet   2. How are you currently taking this medication (dosage and times per day)?   3. Are you having a reaction (difficulty breathing--STAT)?   4. What is your medication issue? Has bruising on her arms.  She has not been taking it for the past three days, she wants to know if she should start taking it again?

## 2020-01-24 NOTE — Telephone Encounter (Signed)
Spoke to patient just now and let her know that she can restart her Plavix. She verbalizes understanding and thanks me for the call back.

## 2020-02-06 ENCOUNTER — Telehealth: Payer: Self-pay | Admitting: Cardiology

## 2020-02-06 NOTE — Telephone Encounter (Signed)
Carmen Blackwell from [facility] was calling requesting verbal orders for in home PT once weekly for 9 weeks. She would also like verbal orders for a one time Nurse consult for a Nurse to discuss the patient's medications with her. [facility] had to close one patient chart and open another when the patient's health insurance changed and information does not match.

## 2020-02-06 NOTE — Telephone Encounter (Signed)
OK 

## 2020-02-06 NOTE — Telephone Encounter (Signed)
Spoke to Bertha just now and let her know that per Dr. Bettina Gavia he was agreeable to these orders. She verbalizes understanding and thanks me for the call back.

## 2020-02-09 ENCOUNTER — Telehealth: Payer: Self-pay | Admitting: Cardiology

## 2020-02-09 ENCOUNTER — Telehealth: Payer: Self-pay

## 2020-02-09 NOTE — Telephone Encounter (Signed)
Spoke to [place] just now with [facility]. She let me know that this patient has been having a lot of bruising while being on Plavix 75 mg daily and Aspirin 81 mg daily. Per. Dr. Bettina Gavia the patient is fine to discontinue the Plavix 75 mg daily and will remain on Aspirin 81 mg daily.   She verbalizes understanding and thanks me for the call back.

## 2020-02-09 NOTE — Telephone Encounter (Signed)
Please see previous phone note for further documentation. No difficulty breathing noted. Call was in regards to bruising.

## 2020-02-09 NOTE — Telephone Encounter (Signed)
Pt c/o medication issue:  1. Name of Medication:  Clopidogrel 75 mg   2. How are you currently taking this medication (dosage and times per day)?  1 tablet per day   3. Are you having a reaction (difficulty breathing--STAT)?   Yes - difficulty breathing   4. What is your medication issue?   Not sure if she is still supposed to be taking medication. Connected call to [place] per DOT phrase.

## 2020-02-29 ENCOUNTER — Ambulatory Visit: Payer: Medicare HMO | Admitting: Cardiology

## 2020-03-01 ENCOUNTER — Other Ambulatory Visit: Payer: Self-pay | Admitting: Cardiology

## 2020-03-12 ENCOUNTER — Telehealth: Payer: Self-pay | Admitting: Cardiology

## 2020-03-12 NOTE — Telephone Encounter (Signed)
Daleen Snook from [facility] requesting verbal orders from Dr. Bettina Gavia to treat patients new skin tear.   Called and left Daleen Snook a message with Dr. Oren Binet recommendations below.  Richardo Priest, MD  You 4 minutes ago (1:00 PM)     No I think this needs to be directed to her PCP as this is a new problem unrelated to her cardiac interventions.

## 2020-03-12 NOTE — Telephone Encounter (Signed)
No I think this needs to be directed to her PCP as this is a new problem unrelated to her cardiac interventions.

## 2020-03-12 NOTE — Telephone Encounter (Signed)
New Message:  Carmen Blackwell called and said pt have a nasty skin tear. She would like to get a verbal order for a Nurse to go and evaluate this tear and what treatment she needs please.

## 2020-03-14 NOTE — Progress Notes (Signed)
Cardiology Office Note:    Date:  03/15/2020   ID:  Carmen Blackwell, DOB 1930-09-04, MRN [id number]  PCP:  Patient, No Pcp Per  Cardiologist:  Shirlee More, MD    Referring MD: No ref. provider found    ASSESSMENT:    1. Severe aortic stenosis   2. Essential hypertension   3. Pure hypercholesterolemia   4. Raynaud's disease without gangrene    PLAN:    In order of problems listed above:  1. From a cardiac perspective she has survived TAVR and overall has done well the issues today very much are related to frailty debilitation need for assistance in her home and for mobility. 2. BP at target 3. Takes a minimum dose of low intensity statin will check a lipid profile and a CMP 4. Trial of a minimum dose of a calcium channel blocker I offered referral to vascular surgeons she declines   Next appointment: 6 months   Medication Adjustments/Labs and Tests Ordered: Current medicines are reviewed at length with the patient today.  Concerns regarding medicines are outlined above.  Orders Placed This Encounter  Procedures  . Lipid panel  . Comprehensive metabolic panel  . Ambulatory referral to Vascular Surgery   Meds ordered this encounter  Medications  . verapamil (CALAN-SR) 120 MG CR tablet    Sig: Take 1 tablet (120 mg total) by mouth at bedtime.    Dispense:  90 tablet    Refill:  3    Chief Complaint  Patient presents with  . Follow-up    After TAVR    History of Present Illness:    Carmen Blackwell is a 86 y.o. female with a hx of aortic stenosis hypertension stage III CKD and recent TAVR 10/25/2019 last seen 12/28/2019.  Recently we have had several calls to the office for noncardiac issues.  Her post TAVR echocardiogram showed normal valve function. Compliance with diet, lifestyle and medications: Yes  Several issues: She had a fall skin tear in the left leg being treated by home health Her legs are discolored at times of become purpuric and hurt and she has  dependent edema she has a diagnosis of Raynaud's. She is diffusely weak and struggles with daily activities. She inquires whether I did forms for a wheelchair electric and I told her no that was outside the scope of my practice and referred her to PCP No shortness of breath chest pain palpitation or syncope She has referral to a vascular specialist and then declines I put her on a minimum dose of verapamil for her Raynaud's and she is aware it may make her edema worsened She has had heart failure and is stopped her diuretic completely Past Medical History:  Diagnosis Date  . Acquired hypothyroidism 04/09/2015  . Aortic stenosis   . Atrophic vaginitis 12/30/2017   Formatting of this note might be different from the original. 2019: ERT vaginally  . Bacterial food poisoning 07/20/2019   Formatting of this note might be different from the original. 2021  . Benign positional vertigo 04/12/2019   Formatting of this note might be different from the original. 2916, 2020  . Chronic kidney disease, stage III (moderate) ([place]) 04/09/2015  . Chronic right-sided low back pain without sciatica 08/07/2017  . Cough 08/28/2015  . Essential hypertension 08/28/2015  . GERD (gastroesophageal reflux disease) 04/09/2015  . HLD (hyperlipidemia)   . Lumbosacral radiculopathy at L5 06/18/2018   Formatting of this note might be different from the original. 2020  .  MALT lymphoma ([place]) 10/10/2015  . Osteopenia 06/02/2017  . Pancytopenia ([place]) 10/10/2015  . Primary osteoarthritis involving multiple joints 05/07/2018  . Raynaud's disease without gangrene 07/20/2019   Formatting of this note might be different from the original. 2020: onset  . Risk for falls 07/14/2016  . S/P TAVR (transcatheter aortic valve replacement) 10/25/2019   s/p TAVR with a 26 mm Edwards S3U via the TF approach by Drs Angelena Form & Roxy Manns   . Sciatica 06/02/2017  . Severe aortic stenosis   . Sjogren's syndrome ([place])   . Throat burning 12/16/2017    Formatting of this note might be different from the original. 2019: chronic  . Urge incontinence 05/06/2016    Past Surgical History:  Procedure Laterality Date  . ABDOMINAL HYSTERECTOMY    . APPENDECTOMY    . LUMBAR DISC SURGERY    . RIGHT/LEFT HEART CATH AND CORONARY ANGIOGRAPHY N/A 10/12/2019   Procedure: RIGHT/LEFT HEART CATH AND CORONARY ANGIOGRAPHY;  Surgeon: Jettie Booze, MD;  Location: [place] [facility];  Service: Cardiovascular;  Laterality: N/A;  . TEE WITHOUT CARDIOVERSION N/A 10/25/2019   Procedure: TRANSESOPHAGEAL ECHOCARDIOGRAM (TEE);  Surgeon: Burnell Blanks, MD;  Location: [place];  Service: Open Heart Surgery;  Laterality: N/A;  . TONSILLECTOMY    . TRANSCATHETER AORTIC VALVE REPLACEMENT, TRANSFEMORAL N/A 10/25/2019   Procedure: TRANSCATHETER AORTIC VALVE REPLACEMENT, TRANSFEMORAL;  Surgeon: Burnell Blanks, MD;  Location: [place];  Service: Open Heart Surgery;  Laterality: N/A;    Current Medications: Current Meds  Medication Sig  . antiseptic oral rinse (BIOTENE) LIQD 15 mLs by Mouth Rinse route as needed for dry mouth. Mouth spray  . aspirin 81 MG chewable tablet Chew 1 tablet (81 mg total) by mouth daily.  . Calcium Carb-Cholecalciferol (CALCIUM 600+D3 PO) Take 1 capsule by mouth daily.  . hydroxychloroquine (PLAQUENIL) 200 MG tablet Take 200 mg by mouth daily.   Marland Kitchen levothyroxine (SYNTHROID, LEVOTHROID) 50 MCG tablet Take 50 mcg by mouth daily before breakfast.   . lovastatin (MEVACOR) 20 MG tablet TAKE 1 TABLET BY MOUTH EVERY DAY  . Magnesium Gluconate 250 MG TABS Take 250 mg by mouth daily.   Marland Kitchen olopatadine (PATADAY) 0.1 % ophthalmic solution Place 1 drop into both eyes 2 (two) times daily.  . potassium gluconate 595 (99 K) MG TABS tablet Take 595 mg by mouth daily. TAKES 1 TABLET DAILY  . predniSONE (DELTASONE) 5 MG tablet Take 5 mg by mouth daily.   . traMADol (ULTRAM) 50 MG tablet Take 50 mg by mouth at bedtime.   . Vaginal Lubricant (REPLENS)  GEL Place 1 application vaginally See admin instructions. Every 3 days     Allergies:   Nsaids, Oxybutynin, Requip [ropinirole hcl], and Ropinirole   Social History   Socioeconomic History  . Marital status: Divorced    Spouse name: Not on file  . Number of children: 1  . Years of education: 70  . Highest education level: Not on file  Occupational History  . Occupation: Real Estate Agent-Retired  Tobacco Use  . Smoking status: Never Smoker  . Smokeless tobacco: Never Used  Vaping Use  . Vaping Use: Never used  Substance and Sexual Activity  . Alcohol use: No  . Drug use: No  . Sexual activity: Not on file  Other Topics Concern  . Not on file  Social History Narrative   Lives alone in a 2 story home.  Has one child.  Retired.  Education: some college.  Social Determinants of Health   Financial Resource Strain:   . Difficulty of Paying Living Expenses: Not on file  Food Insecurity:   . Worried About Charity fundraiser in the Last Year: Not on file  . Ran Out of Food in the Last Year: Not on file  Transportation Needs:   . Lack of Transportation (Medical): Not on file  . Lack of Transportation (Non-Medical): Not on file  Physical Activity:   . Days of Exercise per Week: Not on file  . Minutes of Exercise per Session: Not on file  Stress:   . Feeling of Stress : Not on file  Social Connections:   . Frequency of Communication with Friends and Family: Not on file  . Frequency of Social Gatherings with Friends and Family: Not on file  . Attends Religious Services: Not on file  . Active Member of Clubs or Organizations: Not on file  . Attends Archivist Meetings: Not on file  . Marital Status: Not on file     Family History: The patient's family history includes Heart failure in her brother; Other in her mother. There is no history of CAD, Heart attack, or Heart disease. ROS:   Please see the history of present illness.    All other systems reviewed and  are negative.  EKGs/Labs/Other Studies Reviewed:    The following studies were reviewed today:    Recent Labs: 10/21/2019: ALT 12; B Natriuretic Peptide 77.0 10/26/2019: Magnesium 2.0 11/28/2019: BUN 22; Creatinine, Ser 1.09; Hemoglobin 11.2; NT-Pro BNP 467; Platelets 91; Potassium 5.0; Sodium 137; TSH 1.450  Recent Lipid Panel    Component Value Date/Time   CHOL 170 08/16/2019 1357   TRIG 71 08/16/2019 1357   HDL 89 08/16/2019 1357   CHOLHDL 1.9 08/16/2019 1357   LDLCALC 68 08/16/2019 1357    Physical Exam:    VS:  BP (!) 111/58   Pulse 84   Ht 5\' 7"  (1.702 m)   Wt 100 lb (45.4 kg)   SpO2 95%   BMI 15.66 kg/m     Wt Readings from Last 3 Encounters:  03/15/20 100 lb (45.4 kg)  12/28/19 105 lb 6.4 oz (47.8 kg)  11/28/19 105 lb (47.6 kg)     GEN: She is very thin BMI under 16% and appears quite frail  HEENT: Normal NECK: No JVD; No carotid bruits LYMPHATICS: No lymphadenopathy CARDIAC: RRR, no murmurs, rubs, gallops RESPIRATORY:  Clear to auscultation without rales, wheezing or rhonchi  ABDOMEN: Soft, non-tender, non-distended MUSCULOSKELETAL: She has a little dependent pedal edema; No deformity the feet are purpuric SKIN: Warm and dry NEUROLOGIC:  Alert and oriented x 3 PSYCHIATRIC:  Normal affect    Signed, Shirlee More, MD  03/15/2020 10:27 AM    [place]

## 2020-03-15 ENCOUNTER — Other Ambulatory Visit: Payer: Self-pay

## 2020-03-15 ENCOUNTER — Ambulatory Visit (INDEPENDENT_AMBULATORY_CARE_PROVIDER_SITE_OTHER): Payer: Medicare HMO | Admitting: Cardiology

## 2020-03-15 ENCOUNTER — Encounter: Payer: Self-pay | Admitting: Cardiology

## 2020-03-15 VITALS — BP 111/58 | HR 84 | Ht 67.0 in | Wt 100.0 lb

## 2020-03-15 DIAGNOSIS — I350 Nonrheumatic aortic (valve) stenosis: Secondary | ICD-10-CM

## 2020-03-15 DIAGNOSIS — E7800 Pure hypercholesterolemia, unspecified: Secondary | ICD-10-CM

## 2020-03-15 DIAGNOSIS — I10 Essential (primary) hypertension: Secondary | ICD-10-CM | POA: Diagnosis not present

## 2020-03-15 DIAGNOSIS — I7300 Raynaud's syndrome without gangrene: Secondary | ICD-10-CM

## 2020-03-15 LAB — LIPID PANEL
Chol/HDL Ratio: 1.9 ratio (ref 0.0–4.4)
Cholesterol, Total: 168 mg/dL (ref 100–199)
HDL: 87 mg/dL (ref >39)
LDL Chol Calc (NIH): 67 mg/dL (ref 0–99)
Triglycerides: 77 mg/dL (ref 0–149)
VLDL Cholesterol Cal: 14 mg/dL (ref 5–40)

## 2020-03-15 LAB — COMPREHENSIVE METABOLIC PANEL
ALT: 9 IU/L (ref 0–32)
AST: 20 IU/L (ref 0–40)
Albumin/Globulin Ratio: 1.9 (ref 1.2–2.2)
Albumin: 4.1 g/dL (ref 3.6–4.6)
Alkaline Phosphatase: 62 IU/L (ref 44–121)
BUN/Creatinine Ratio: 15 (ref 12–28)
BUN: 18 mg/dL (ref 8–27)
Bilirubin Total: 0.3 mg/dL (ref 0.0–1.2)
CO2: 27 mmol/L (ref 20–29)
Calcium: 9.5 mg/dL (ref 8.7–10.3)
Chloride: 102 mmol/L (ref 96–106)
Creatinine, Ser: 1.21 mg/dL — ABNORMAL HIGH (ref 0.57–1.00)
GFR calc Af Amer: 46 mL/min/{1.73_m2} — ABNORMAL LOW (ref >59)
GFR calc non Af Amer: 40 mL/min/{1.73_m2} — ABNORMAL LOW (ref >59)
Globulin, Total: 2.2 g/dL (ref 1.5–4.5)
Glucose: 66 mg/dL (ref 65–99)
Potassium: 4 mmol/L (ref 3.5–5.2)
Sodium: 142 mmol/L (ref 134–144)
Total Protein: 6.3 g/dL (ref 6.0–8.5)

## 2020-03-15 MED ORDER — VERAPAMIL HCL ER 120 MG PO TBCR: 120.0000 mg | EXTENDED_RELEASE_TABLET | Freq: Every day | ORAL | 3 refills | Status: DC

## 2020-03-15 NOTE — Patient Instructions (Signed)
Medication Instructions:  Your physician has recommended you make the following change in your medication:  START: Verapamil 120 mg take one tablet by mouth daily.  *If you need a refill on your cardiac medications before your next appointment, please call your pharmacy*   Lab Work: Your physician recommends that you return for lab work in: [place], Lipids If you have labs (blood work) drawn today and your tests are completely normal, you will receive your results only by: Marland Kitchen MyChart Message (if you have MyChart) OR . A paper copy in the mail If you have any lab test that is abnormal or we need to change your treatment, we will call you to review the results.   Testing/Procedures: None   Follow-Up: At [facility], you and your health needs are our priority.  As part of our continuing mission to provide you with exceptional heart care, we have created designated Provider Care Teams.  These Care Teams include your primary Cardiologist (physician) and Advanced Practice Providers (APPs -  Physician Assistants and Nurse Practitioners) who all work together to provide you with the care you need, when you need it.  We recommend signing up for the patient portal called "MyChart".  Sign up information is provided on this After Visit Summary.  MyChart is used to connect with patients for Virtual Visits (Telemedicine).  Patients are able to view lab/test results, encounter notes, upcoming appointments, etc.  Non-urgent messages can be sent to your provider as well.   To learn more about what you can do with MyChart, go to [email or web address].    Your next appointment:   6 month(s)  The format for your next appointment:   In Person  Provider:   Shirlee More, MD   Other Instructions

## 2020-04-06 ENCOUNTER — Other Ambulatory Visit: Payer: Self-pay | Admitting: Physician Assistant

## 2020-05-23 ENCOUNTER — Other Ambulatory Visit (HOSPITAL_COMMUNITY): Payer: Medicare HMO

## 2020-05-23 ENCOUNTER — Encounter: Payer: Medicare HMO | Admitting: Vascular Surgery

## 2020-05-31 DIAGNOSIS — Z0100 Encounter for examination of eyes and vision without abnormal findings: Secondary | ICD-10-CM | POA: Diagnosis not present

## 2020-05-31 DIAGNOSIS — H524 Presbyopia: Secondary | ICD-10-CM | POA: Diagnosis not present

## 2020-06-21 DIAGNOSIS — Z7409 Other reduced mobility: Secondary | ICD-10-CM | POA: Diagnosis not present

## 2020-06-21 DIAGNOSIS — E782 Mixed hyperlipidemia: Secondary | ICD-10-CM | POA: Diagnosis not present

## 2020-06-21 DIAGNOSIS — I110 Hypertensive heart disease with heart failure: Secondary | ICD-10-CM | POA: Diagnosis not present

## 2020-06-21 DIAGNOSIS — I7300 Raynaud's syndrome without gangrene: Secondary | ICD-10-CM | POA: Diagnosis not present

## 2020-06-21 DIAGNOSIS — I5032 Chronic diastolic (congestive) heart failure: Secondary | ICD-10-CM | POA: Diagnosis not present

## 2020-06-21 DIAGNOSIS — H8110 Benign paroxysmal vertigo, unspecified ear: Secondary | ICD-10-CM | POA: Diagnosis not present

## 2020-06-21 DIAGNOSIS — C884 Extranodal marginal zone B-cell lymphoma of mucosa-associated lymphoid tissue [MALT-lymphoma]: Secondary | ICD-10-CM | POA: Diagnosis not present

## 2020-06-21 DIAGNOSIS — E039 Hypothyroidism, unspecified: Secondary | ICD-10-CM | POA: Diagnosis not present

## 2020-06-21 DIAGNOSIS — Z7689 Persons encountering health services in other specified circumstances: Secondary | ICD-10-CM | POA: Diagnosis not present

## 2020-06-21 DIAGNOSIS — M3500 Sicca syndrome, unspecified: Secondary | ICD-10-CM | POA: Diagnosis not present

## 2020-07-06 DIAGNOSIS — D696 Thrombocytopenia, unspecified: Secondary | ICD-10-CM | POA: Diagnosis not present

## 2020-07-06 DIAGNOSIS — D61818 Other pancytopenia: Secondary | ICD-10-CM | POA: Diagnosis not present

## 2020-07-06 DIAGNOSIS — I7300 Raynaud's syndrome without gangrene: Secondary | ICD-10-CM | POA: Diagnosis not present

## 2020-07-06 DIAGNOSIS — N1831 Chronic kidney disease, stage 3a: Secondary | ICD-10-CM | POA: Diagnosis not present

## 2020-07-06 DIAGNOSIS — L97922 Non-pressure chronic ulcer of unspecified part of left lower leg with fat layer exposed: Secondary | ICD-10-CM | POA: Diagnosis not present

## 2020-07-06 DIAGNOSIS — I5032 Chronic diastolic (congestive) heart failure: Secondary | ICD-10-CM | POA: Diagnosis not present

## 2020-07-06 DIAGNOSIS — R2689 Other abnormalities of gait and mobility: Secondary | ICD-10-CM | POA: Diagnosis not present

## 2020-07-06 DIAGNOSIS — R195 Other fecal abnormalities: Secondary | ICD-10-CM | POA: Diagnosis not present

## 2020-08-22 ENCOUNTER — Other Ambulatory Visit: Payer: Self-pay | Admitting: Physician Assistant

## 2020-08-22 DIAGNOSIS — Z952 Presence of prosthetic heart valve: Secondary | ICD-10-CM

## 2020-08-29 DIAGNOSIS — C44321 Squamous cell carcinoma of skin of nose: Secondary | ICD-10-CM | POA: Diagnosis not present

## 2020-09-01 ENCOUNTER — Other Ambulatory Visit: Payer: Self-pay | Admitting: Cardiology

## 2020-09-17 ENCOUNTER — Telehealth: Payer: Self-pay | Admitting: Cardiology

## 2020-09-17 NOTE — Telephone Encounter (Signed)
Left message for patient to return call.

## 2020-09-17 NOTE — Telephone Encounter (Signed)
Called and spoke to patient. She reports she has been having swelling in her ankles off and on for the past 3 weeks. In the morning she doesn't notice it but it seems to get worse as the day goes on. No shortness of breath no weight gain. Patient reports she doesn't take lasix  as needed as her chart lists because Dr. Bettina Gavia advised for her to not take since she was having bleeding on arms and legs. Patient reports red splotches on her legs for almost a year. She states this is why Dr. Bettina Gavia advised her not to use lasix as needed anymore. Will check with Dr. Bettina Gavia for clarity and recommendations if needed.

## 2020-09-17 NOTE — Telephone Encounter (Signed)
Pt c/o swelling: STAT is pt has developed SOB within 24 hours  1) How much weight have you gained and in what time span? No, states she weighs the same  2) If swelling, where is the swelling located? ankles  3) Are you currently taking a fluid pill? no  4) Are you currently SOB? no  5) Do you have a log of your daily weights (if so, list)? No   6) Have you gained 3 pounds in a day or 5 pounds in a week? no  7) Have you traveled recently? no

## 2020-09-18 NOTE — Telephone Encounter (Signed)
Spoke to the patient just now and let her know Dr. Joya Gaskins recommendations. She verbalizes understanding and thanks me for calling her back.

## 2020-09-18 NOTE — Telephone Encounter (Signed)
I do not think I told her to stop taking furosemide  She should take it if she is having peripheral edema

## 2020-09-19 DIAGNOSIS — D649 Anemia, unspecified: Secondary | ICD-10-CM | POA: Diagnosis not present

## 2020-09-25 DIAGNOSIS — C44321 Squamous cell carcinoma of skin of nose: Secondary | ICD-10-CM | POA: Diagnosis not present

## 2020-10-04 DIAGNOSIS — M2550 Pain in unspecified joint: Secondary | ICD-10-CM | POA: Diagnosis not present

## 2020-10-04 DIAGNOSIS — Z79899 Other long term (current) drug therapy: Secondary | ICD-10-CM | POA: Diagnosis not present

## 2020-10-04 DIAGNOSIS — M3501 Sicca syndrome with keratoconjunctivitis: Secondary | ICD-10-CM | POA: Diagnosis not present

## 2020-10-17 ENCOUNTER — Other Ambulatory Visit: Payer: Medicare HMO

## 2020-10-24 ENCOUNTER — Ambulatory Visit: Payer: Medicare HMO | Admitting: Cardiology

## 2020-10-24 DIAGNOSIS — C44321 Squamous cell carcinoma of skin of nose: Secondary | ICD-10-CM | POA: Diagnosis not present

## 2020-11-01 DIAGNOSIS — R0781 Pleurodynia: Secondary | ICD-10-CM | POA: Diagnosis not present

## 2020-11-01 DIAGNOSIS — R42 Dizziness and giddiness: Secondary | ICD-10-CM | POA: Diagnosis not present

## 2020-11-01 DIAGNOSIS — W182XXA Fall in (into) shower or empty bathtub, initial encounter: Secondary | ICD-10-CM | POA: Diagnosis not present

## 2020-11-02 DIAGNOSIS — S20219A Contusion of unspecified front wall of thorax, initial encounter: Secondary | ICD-10-CM | POA: Diagnosis not present

## 2020-11-02 DIAGNOSIS — W19XXXA Unspecified fall, initial encounter: Secondary | ICD-10-CM | POA: Diagnosis not present

## 2020-11-07 ENCOUNTER — Ambulatory Visit (INDEPENDENT_AMBULATORY_CARE_PROVIDER_SITE_OTHER): Payer: Medicare HMO

## 2020-11-07 ENCOUNTER — Other Ambulatory Visit: Payer: Self-pay

## 2020-11-07 DIAGNOSIS — Z952 Presence of prosthetic heart valve: Secondary | ICD-10-CM

## 2020-11-07 LAB — ECHOCARDIOGRAM COMPLETE
AR max vel: 0.99 cm2
AV Area VTI: 1.08 cm2
AV Area mean vel: 1.03 cm2
AV Mean grad: 9.5 mmHg
AV Peak grad: 18.5 mmHg
Ao pk vel: 2.15 m/s
Area-P 1/2: 2.3 cm2
S' Lateral: 2.4 cm

## 2020-11-07 NOTE — Progress Notes (Signed)
Complete echocardiogram performed.  Jimmy Early Ord RDCS, RVT  

## 2020-11-14 DIAGNOSIS — M5417 Radiculopathy, lumbosacral region: Secondary | ICD-10-CM | POA: Diagnosis not present

## 2020-11-14 DIAGNOSIS — Z9181 History of falling: Secondary | ICD-10-CM | POA: Diagnosis not present

## 2020-11-14 DIAGNOSIS — R42 Dizziness and giddiness: Secondary | ICD-10-CM | POA: Diagnosis not present

## 2020-11-14 DIAGNOSIS — Z7409 Other reduced mobility: Secondary | ICD-10-CM | POA: Diagnosis not present

## 2020-11-27 ENCOUNTER — Telehealth: Payer: Self-pay | Admitting: Cardiology

## 2020-11-27 NOTE — Telephone Encounter (Signed)
Patient was calling in to see if she can switch her appt on Friday 8/5 to a virtually visit. Patients states she had a real bad fall that she is still trying heal from. Please advise

## 2020-11-29 NOTE — Progress Notes (Addendum)
Virtual Visit via Telephone Note   This visit type was conducted due to national recommendations for restrictions regarding the COVID-19 Pandemic (e.g. social distancing) in an effort to limit this patient's exposure and mitigate transmission in our community.  Due to her co-morbid illnesses, this patient is at least at moderate risk for complications without adequate follow up.  This format is felt to be most appropriate for this patient at this time.  The patient did not have access to video technology/had technical difficulties with video requiring transitioning to audio format only (telephone).  All issues noted in this document were discussed and addressed.  No physical exam could be performed with this format.  Please refer to the patient's chart for her  consent to telehealth for [facility].    Date:  11/30/2020   ID:  Carmen Blackwell, DOB Jan 14, 1931, MRN [id number] The patient was identified using 2 identifiers.  Patient Location: Home Provider Location: Office/Clinic   PCP:  Patient, No Pcp Per (Inactive)   [facility] Providers Cardiologist:  None     Evaluation Performed:  Follow-Up Visit  Chief Complaint: Cardiology follow-up she had TAVR performed 1 year ago other problems include hypertensive heart disease with heart failure frailty and deconditioning  History of Present Illness:    Carmen Blackwell is a 87 y.o. female with severe aortic stenosis she underwent TAVR survive the procedure but has struggled because of generalized debilitation and frailty and the needs for assistance in her home and mobility.  Other problems include hypertension hyperlipidemia and Raynaud's disease without gangrene.  She requested office visits that she has had a recent fall is not capable of getting out of the home.  She was last seen by me 05/16/2019  The patient does not have symptoms concerning for COVID-19 infection (fever, chills, cough, or new shortness of breath).   Recent labs  [facility] 09/19/2020 Hemoglobin 12.2 platelets decreased 99,000 Potassium 4.2 creatinine 1.06  She sees [facility] primary care had a recent office visits her 11/14/2020 with falls vertigo lumbosacral radiculopathy and poor mobility.  At that visit her medications included aspirin clopidogrel furosemide lovastatin and potassium Her blood pressures recorded 116/43 pulse 69 oxygen sat 97%  There is some confusion about medications she stopped both aspirin and clopidogrel and will resume her aspirin. She is improved on a diuretic she has no edema. She has had no chest pain orthopnea palpitation or syncope. Unfortunately she is frail unsteady and has had a fall.  She is scheduled to see orthopedic surgery next week She was unaware and I reviewed the results of her 1 year post TAVR echocardiogram showing normal ejection fraction and normal TAVR function.  She is reassured. Past Medical History:  Diagnosis Date   Acquired hypothyroidism 04/09/2015   Aortic stenosis    Atrophic vaginitis 12/30/2017   Formatting of this note might be different from the original. 2019: ERT vaginally   Bacterial food poisoning 07/20/2019   Formatting of this note might be different from the original. 2021   Benign positional vertigo 04/12/2019   Formatting of this note might be different from the original. 2916, 2020   Chronic kidney disease, stage III (moderate) ([place]) 04/09/2015   Chronic right-sided low back pain without sciatica 08/07/2017   Cough 08/28/2015   Essential hypertension 08/28/2015   GERD (gastroesophageal reflux disease) 04/09/2015   HLD (hyperlipidemia)    Lumbosacral radiculopathy at L5 06/18/2018   Formatting of this note might be different from the original.  2020   MALT lymphoma ([place]) 10/10/2015   Osteopenia 06/02/2017   Pancytopenia ([place]) 10/10/2015   Primary osteoarthritis involving multiple joints 05/07/2018   Raynaud's disease without gangrene 07/20/2019   Formatting of this note  might be different from the original. 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 2019: chronic   Urge incontinence 05/06/2016   Past Surgical History:  Procedure Laterality Date   ABDOMINAL HYSTERECTOMY     APPENDECTOMY     LUMBAR DISC SURGERY     RIGHT/LEFT HEART CATH AND CORONARY ANGIOGRAPHY N/A 10/12/2019   Procedure: RIGHT/LEFT HEART CATH AND CORONARY ANGIOGRAPHY;  Surgeon: Jettie Booze, MD;  Location: [facility];  Service: Cardiovascular;  Laterality: N/A;   TEE WITHOUT CARDIOVERSION N/A 10/25/2019   Procedure: TRANSESOPHAGEAL ECHOCARDIOGRAM (TEE);  Surgeon: Burnell Blanks, MD;  Location: [facility];  Service: Open Heart Surgery;  Laterality: N/A;   TONSILLECTOMY     TRANSCATHETER AORTIC VALVE REPLACEMENT, TRANSFEMORAL N/A 10/25/2019   Procedure: TRANSCATHETER AORTIC VALVE REPLACEMENT, TRANSFEMORAL;  Surgeon: Burnell Blanks, MD;  Location: [facility];  Service: Open Heart Surgery;  Laterality: N/A;     Current Meds  Medication Sig   antiseptic oral rinse (BIOTENE) LIQD 15 mLs by Mouth Rinse route as needed for dry mouth. Mouth spray   aspirin 81 MG chewable tablet Chew 1 tablet (81 mg total) by mouth daily.   Calcium Carb-Cholecalciferol (CALCIUM 600+D3 PO) Take 1 capsule by mouth daily.   furosemide (LASIX) 20 MG tablet TAKE 1 TABLET BY MOUTH EVERY DAY AS NEEDED   hydroxychloroquine (PLAQUENIL) 200 MG tablet Take 200 mg by mouth daily.    levothyroxine (SYNTHROID, LEVOTHROID) 50 MCG tablet Take 50 mcg by mouth daily before breakfast.    lovastatin (MEVACOR) 20 MG tablet TAKE 1 TABLET BY MOUTH EVERY DAY   Magnesium Gluconate 250 MG TABS Take 250 mg by mouth  daily.    olopatadine (PATANOL) 0.1 % ophthalmic solution Place 1 drop into both eyes 2 (two) times daily.   potassium gluconate 595 (99 K) MG TABS tablet Take 595 mg by mouth daily. TAKES 1 TABLET DAILY   predniSONE (DELTASONE) 5 MG tablet Take 5 mg by mouth daily.    traMADol (ULTRAM) 50 MG tablet Take 50 mg by mouth at bedtime.    Vaginal Lubricant (REPLENS) GEL Place 1 application vaginally See admin instructions. Every 3 days     Allergies:   Nsaids, Oxybutynin, Requip [ropinirole hcl], and Ropinirole   Social History   Tobacco Use   Smoking status: Never   Smokeless tobacco: Never  Vaping Use   Vaping Use: Never used  Substance Use Topics   Alcohol use: No   Drug use: No     Family Hx: The patient's family history includes Heart failure in her brother; Other in her mother. There is no history of CAD, Heart attack, or Heart disease.  ROS:   Please see the history of present illness.     All other systems reviewed and are negative.   Prior CV studies:   The following studies were reviewed today:  Echo 11/07/2020:  1. Left  ventricular ejection fraction, by estimation, is 60 to 65%. The  left ventricle has normal function. Left ventricular endocardial border  not optimally defined to evaluate regional wall motion. There is mild  concentric left ventricular  hypertrophy. Left ventricular diastolic parameters are consistent with  Grade I diastolic dysfunction (impaired relaxation).   2. Right ventricular systolic function is normal. The right ventricular  size is normal. There is normal pulmonary artery systolic pressure.   3. The mitral valve is normal in structure. Trivial mitral valve  regurgitation. No evidence of mitral stenosis.   4. Edwards Sapien 3 Ultra THV (size 26 mm, model # 9750TFX, serial #  [id number]). The aortic valve has been repaired/replaced. Aortic valve  regurgitation is not visualized. There is a Sapien prosthetic (TAVR) valve  present in the aortic  position. Procedure   Date: TAVR 10/25/2019 . Echo findings are consistent with normal  structure and function of the aortic valve prosthesis.   5. The inferior vena cava is normal in size with greater than 50%  respiratory variability, suggesting right atrial pressure of 3 mmHg.   Labs/Other Tests and Data Reviewed:    EKG:  No ECG reviewed.  Recent Labs: 03/15/2020: ALT 9; BUN 18; Creatinine, Ser 1.21; Potassium 4.0; Sodium 142   Recent Lipid Panel Lab Results  Component Value Date/Time   CHOL 168 03/15/2020 10:16 AM   TRIG 77 03/15/2020 10:16 AM   HDL 87 03/15/2020 10:16 AM   CHOLHDL 1.9 03/15/2020 10:16 AM   LDLCALC 67 03/15/2020 10:16 AM    Wt Readings from Last 3 Encounters:  11/30/20 100 lb (45.4 kg)  03/15/20 100 lb (45.4 kg)  12/28/19 105 lb 6.4 oz (47.8 kg)     Risk Assessment/Calculations:          Objective:    Vital Signs:  Ht '5\' 7"'$  (1.702 m)   Wt 100 lb (45.4 kg)   BMI 15.66 kg/m    VITAL SIGNS:  reviewed GEN:  no acute distress  ASSESSMENT & PLAN:    Her problems include status post TAVR she is made a good recovery shows normal valve function survive the procedure.  She has NYHA class II symptoms; mostly related to orthopedic issues. She has hypertensive heart disease with heart failure and she will continue her loop diuretic she has stage III CKD and labs are being followed by her primary care physician she will continue her low-dose diuretic Hyperlipidemia on a statin continue lovastatin Stable hypothyroidism on thyroid replacement        COVID-19 Education: The signs and symptoms of COVID-19 were discussed with the patient and how to seek care for testing (follow up with PCP or arrange E-visit).  The importance of social distancing was discussed today.  Time:   Today, I have spent 25 minutes with the patient with telehealth technology discussing the above problems.     Medication Adjustments/Labs and Tests Ordered: Current medicines are  reviewed at length with the patient today.  Concerns regarding medicines are outlined above.   Tests Ordered: No orders of the defined types were placed in this encounter.   Medication Changes: No orders of the defined types were placed in this encounter.   Follow Up:  In Person in 1 year(s)  Signed, Shirlee More, MD  11/30/2020 11:15 AM    [place]

## 2020-11-30 ENCOUNTER — Encounter: Payer: Self-pay | Admitting: Cardiology

## 2020-11-30 ENCOUNTER — Telehealth (INDEPENDENT_AMBULATORY_CARE_PROVIDER_SITE_OTHER): Payer: Medicare HMO | Admitting: Cardiology

## 2020-11-30 ENCOUNTER — Other Ambulatory Visit: Payer: Self-pay

## 2020-11-30 VITALS — Ht 67.0 in | Wt 100.0 lb

## 2020-11-30 DIAGNOSIS — Z952 Presence of prosthetic heart valve: Secondary | ICD-10-CM

## 2020-11-30 DIAGNOSIS — N183 Chronic kidney disease, stage 3 unspecified: Secondary | ICD-10-CM | POA: Diagnosis not present

## 2020-11-30 DIAGNOSIS — E039 Hypothyroidism, unspecified: Secondary | ICD-10-CM | POA: Diagnosis not present

## 2020-11-30 DIAGNOSIS — E7800 Pure hypercholesterolemia, unspecified: Secondary | ICD-10-CM | POA: Diagnosis not present

## 2020-11-30 DIAGNOSIS — I5032 Chronic diastolic (congestive) heart failure: Secondary | ICD-10-CM

## 2020-11-30 DIAGNOSIS — I110 Hypertensive heart disease with heart failure: Secondary | ICD-10-CM

## 2020-11-30 NOTE — Patient Instructions (Signed)

## 2020-12-10 ENCOUNTER — Telehealth: Payer: Self-pay | Admitting: Cardiology

## 2020-12-10 NOTE — Telephone Encounter (Signed)
Spoke to the patient just now and let her know Dr. Terrial Rhodes recommendations. She verbalizes understanding but is upset with this and says "Thank you  Carmen Blackwell, goodbye" and she hung up the phone at this time.

## 2020-12-10 NOTE — Telephone Encounter (Signed)
Pt c/o medication issue:  1. Name of Medication:  Aspirin 81 MG's   2. How are you currently taking this medication (dosage and times per day)? 1 tablet daily   3. Are you having a reaction (difficulty breathing--STAT)? Yes   4. What is your medication issue? Carmen Blackwell called back due to getting disconnected. She states since starting back on aspirin 81 MG's (not the chewable tablets) she has noticed large blood spots on her legs and arms. She reports she cut the bottom of her leg and it almost never stopped bleeding. It is now red around the cut and seems to not be healing right. She is wanting to know if she should still be taking the aspirin due to this. She is requesting to speak with Lilia Pro in regards to this. Please advise.

## 2020-12-10 NOTE — Telephone Encounter (Signed)
Patient was calling to speak with the nurse but then got disconnected. Please advise

## 2021-01-01 DIAGNOSIS — M21371 Foot drop, right foot: Secondary | ICD-10-CM | POA: Diagnosis not present

## 2021-01-01 DIAGNOSIS — M5417 Radiculopathy, lumbosacral region: Secondary | ICD-10-CM | POA: Diagnosis not present

## 2021-01-01 DIAGNOSIS — W19XXXA Unspecified fall, initial encounter: Secondary | ICD-10-CM | POA: Diagnosis not present

## 2021-01-01 DIAGNOSIS — Z9181 History of falling: Secondary | ICD-10-CM | POA: Diagnosis not present

## 2021-01-01 DIAGNOSIS — M5416 Radiculopathy, lumbar region: Secondary | ICD-10-CM | POA: Diagnosis not present

## 2021-01-03 DIAGNOSIS — C884 Extranodal marginal zone B-cell lymphoma of mucosa-associated lymphoid tissue [MALT-lymphoma]: Secondary | ICD-10-CM | POA: Diagnosis not present

## 2021-01-03 DIAGNOSIS — L988 Other specified disorders of the skin and subcutaneous tissue: Secondary | ICD-10-CM | POA: Diagnosis not present

## 2021-01-03 DIAGNOSIS — D696 Thrombocytopenia, unspecified: Secondary | ICD-10-CM | POA: Diagnosis not present

## 2021-01-03 DIAGNOSIS — M3500 Sicca syndrome, unspecified: Secondary | ICD-10-CM | POA: Diagnosis not present

## 2021-01-03 DIAGNOSIS — M5489 Other dorsalgia: Secondary | ICD-10-CM | POA: Diagnosis not present

## 2021-01-03 DIAGNOSIS — Z9989 Dependence on other enabling machines and devices: Secondary | ICD-10-CM | POA: Diagnosis not present

## 2021-01-03 DIAGNOSIS — R5383 Other fatigue: Secondary | ICD-10-CM | POA: Diagnosis not present

## 2021-01-30 DIAGNOSIS — M5417 Radiculopathy, lumbosacral region: Secondary | ICD-10-CM | POA: Diagnosis not present

## 2021-01-30 DIAGNOSIS — Z79899 Other long term (current) drug therapy: Secondary | ICD-10-CM | POA: Diagnosis not present

## 2021-02-19 DIAGNOSIS — M6281 Muscle weakness (generalized): Secondary | ICD-10-CM | POA: Diagnosis not present

## 2021-02-19 DIAGNOSIS — R2689 Other abnormalities of gait and mobility: Secondary | ICD-10-CM | POA: Diagnosis not present

## 2021-02-19 DIAGNOSIS — I130 Hypertensive heart and chronic kidney disease with heart failure and stage 1 through stage 4 chronic kidney disease, or unspecified chronic kidney disease: Secondary | ICD-10-CM | POA: Diagnosis not present

## 2021-02-19 DIAGNOSIS — M5417 Radiculopathy, lumbosacral region: Secondary | ICD-10-CM | POA: Diagnosis not present

## 2021-03-01 ENCOUNTER — Other Ambulatory Visit: Payer: Self-pay | Admitting: Cardiology

## 2021-03-15 ENCOUNTER — Other Ambulatory Visit: Payer: Self-pay | Admitting: Cardiology

## 2021-04-03 DIAGNOSIS — H04123 Dry eye syndrome of bilateral lacrimal glands: Secondary | ICD-10-CM | POA: Diagnosis not present

## 2021-04-05 DIAGNOSIS — M3501 Sicca syndrome with keratoconjunctivitis: Secondary | ICD-10-CM | POA: Diagnosis not present

## 2021-04-05 DIAGNOSIS — M2550 Pain in unspecified joint: Secondary | ICD-10-CM | POA: Diagnosis not present

## 2021-04-05 DIAGNOSIS — Z79899 Other long term (current) drug therapy: Secondary | ICD-10-CM | POA: Diagnosis not present

## 2021-04-05 DIAGNOSIS — Z7952 Long term (current) use of systemic steroids: Secondary | ICD-10-CM | POA: Diagnosis not present

## 2021-04-10 DIAGNOSIS — H04123 Dry eye syndrome of bilateral lacrimal glands: Secondary | ICD-10-CM | POA: Diagnosis not present

## 2021-04-17 DIAGNOSIS — I5032 Chronic diastolic (congestive) heart failure: Secondary | ICD-10-CM | POA: Diagnosis not present

## 2021-04-17 DIAGNOSIS — R2689 Other abnormalities of gait and mobility: Secondary | ICD-10-CM | POA: Diagnosis not present

## 2021-04-17 DIAGNOSIS — I7300 Raynaud's syndrome without gangrene: Secondary | ICD-10-CM | POA: Diagnosis not present

## 2021-04-26 DIAGNOSIS — Z0100 Encounter for examination of eyes and vision without abnormal findings: Secondary | ICD-10-CM | POA: Diagnosis not present

## 2021-04-26 DIAGNOSIS — Z961 Presence of intraocular lens: Secondary | ICD-10-CM | POA: Diagnosis not present

## 2021-05-27 ENCOUNTER — Telehealth: Payer: Self-pay | Admitting: Cardiology

## 2021-05-27 NOTE — Telephone Encounter (Signed)
Patient would like to ask Dr. Loney Hering for some [place]. She states she needs it. She can't get in the bath by herself, she can't get out of the house.  She is dizzy.

## 2021-05-27 NOTE — Telephone Encounter (Signed)
Spoke with pt who is requesting home health services as she cannot care for herself. Pt states she has been having dizziness and she took a Meclizine which has helped. Pt has not been seen since 8/22 and states it is not time for her an appointment. Pt states she has no family or anyone to help her. Pt is begging you to get her some Medicare assisted home health. Pt was informed she needed to see her PCP, Urgent Care, RD or to call 911 for evaluation but she refused stating that this is all she needs.

## 2021-05-28 NOTE — Telephone Encounter (Signed)
Spoke to patient just now and let her know Dr. Joya Gaskins recommendations. She verbalizes understanding and hung up the phone before we could discuss any further.    Encouraged patient to call back with any questions or concerns.

## 2021-06-11 DIAGNOSIS — Z7409 Other reduced mobility: Secondary | ICD-10-CM | POA: Diagnosis not present

## 2021-06-11 DIAGNOSIS — M5417 Radiculopathy, lumbosacral region: Secondary | ICD-10-CM | POA: Diagnosis not present

## 2021-06-13 DIAGNOSIS — E0500 Thyrotoxicosis with diffuse goiter without thyrotoxic crisis or storm: Secondary | ICD-10-CM | POA: Diagnosis not present

## 2021-06-13 DIAGNOSIS — Z90710 Acquired absence of both cervix and uterus: Secondary | ICD-10-CM | POA: Diagnosis not present

## 2021-06-13 DIAGNOSIS — Z9089 Acquired absence of other organs: Secondary | ICD-10-CM | POA: Diagnosis not present

## 2021-06-13 DIAGNOSIS — S81811D Laceration without foreign body, right lower leg, subsequent encounter: Secondary | ICD-10-CM | POA: Diagnosis not present

## 2021-06-13 DIAGNOSIS — M5417 Radiculopathy, lumbosacral region: Secondary | ICD-10-CM | POA: Diagnosis not present

## 2021-06-13 DIAGNOSIS — M79675 Pain in left toe(s): Secondary | ICD-10-CM | POA: Diagnosis not present

## 2021-06-13 DIAGNOSIS — R69 Illness, unspecified: Secondary | ICD-10-CM | POA: Diagnosis not present

## 2021-06-13 DIAGNOSIS — Z87440 Personal history of urinary (tract) infections: Secondary | ICD-10-CM | POA: Diagnosis not present

## 2021-06-13 DIAGNOSIS — M25572 Pain in left ankle and joints of left foot: Secondary | ICD-10-CM | POA: Diagnosis not present

## 2021-06-13 DIAGNOSIS — Z7952 Long term (current) use of systemic steroids: Secondary | ICD-10-CM | POA: Diagnosis not present

## 2021-06-13 DIAGNOSIS — E785 Hyperlipidemia, unspecified: Secondary | ICD-10-CM | POA: Diagnosis not present

## 2021-06-13 DIAGNOSIS — R296 Repeated falls: Secondary | ICD-10-CM | POA: Diagnosis not present

## 2021-06-13 DIAGNOSIS — Z7982 Long term (current) use of aspirin: Secondary | ICD-10-CM | POA: Diagnosis not present

## 2021-06-13 DIAGNOSIS — M8580 Other specified disorders of bone density and structure, unspecified site: Secondary | ICD-10-CM | POA: Diagnosis not present

## 2021-06-13 DIAGNOSIS — Z8571 Personal history of Hodgkin lymphoma: Secondary | ICD-10-CM | POA: Diagnosis not present

## 2021-06-13 DIAGNOSIS — G8929 Other chronic pain: Secondary | ICD-10-CM | POA: Diagnosis not present

## 2021-06-13 DIAGNOSIS — M3501 Sicca syndrome with keratoconjunctivitis: Secondary | ICD-10-CM | POA: Diagnosis not present

## 2021-06-13 DIAGNOSIS — Z7902 Long term (current) use of antithrombotics/antiplatelets: Secondary | ICD-10-CM | POA: Diagnosis not present

## 2021-06-13 DIAGNOSIS — M79674 Pain in right toe(s): Secondary | ICD-10-CM | POA: Diagnosis not present

## 2021-06-13 DIAGNOSIS — Z9181 History of falling: Secondary | ICD-10-CM | POA: Diagnosis not present

## 2021-06-13 DIAGNOSIS — D649 Anemia, unspecified: Secondary | ICD-10-CM | POA: Diagnosis not present

## 2021-06-13 DIAGNOSIS — M419 Scoliosis, unspecified: Secondary | ICD-10-CM | POA: Diagnosis not present

## 2021-06-13 DIAGNOSIS — M25571 Pain in right ankle and joints of right foot: Secondary | ICD-10-CM | POA: Diagnosis not present

## 2021-06-17 DIAGNOSIS — M79675 Pain in left toe(s): Secondary | ICD-10-CM | POA: Diagnosis not present

## 2021-06-17 DIAGNOSIS — Z7952 Long term (current) use of systemic steroids: Secondary | ICD-10-CM | POA: Diagnosis not present

## 2021-06-17 DIAGNOSIS — M25572 Pain in left ankle and joints of left foot: Secondary | ICD-10-CM | POA: Diagnosis not present

## 2021-06-17 DIAGNOSIS — Z9089 Acquired absence of other organs: Secondary | ICD-10-CM | POA: Diagnosis not present

## 2021-06-17 DIAGNOSIS — Z90710 Acquired absence of both cervix and uterus: Secondary | ICD-10-CM | POA: Diagnosis not present

## 2021-06-17 DIAGNOSIS — M5417 Radiculopathy, lumbosacral region: Secondary | ICD-10-CM | POA: Diagnosis not present

## 2021-06-17 DIAGNOSIS — Z9181 History of falling: Secondary | ICD-10-CM | POA: Diagnosis not present

## 2021-06-17 DIAGNOSIS — Z8571 Personal history of Hodgkin lymphoma: Secondary | ICD-10-CM | POA: Diagnosis not present

## 2021-06-17 DIAGNOSIS — G8929 Other chronic pain: Secondary | ICD-10-CM | POA: Diagnosis not present

## 2021-06-17 DIAGNOSIS — M25571 Pain in right ankle and joints of right foot: Secondary | ICD-10-CM | POA: Diagnosis not present

## 2021-06-17 DIAGNOSIS — R296 Repeated falls: Secondary | ICD-10-CM | POA: Diagnosis not present

## 2021-06-17 DIAGNOSIS — M79674 Pain in right toe(s): Secondary | ICD-10-CM | POA: Diagnosis not present

## 2021-06-17 DIAGNOSIS — M3501 Sicca syndrome with keratoconjunctivitis: Secondary | ICD-10-CM | POA: Diagnosis not present

## 2021-06-17 DIAGNOSIS — E785 Hyperlipidemia, unspecified: Secondary | ICD-10-CM | POA: Diagnosis not present

## 2021-06-17 DIAGNOSIS — Z7982 Long term (current) use of aspirin: Secondary | ICD-10-CM | POA: Diagnosis not present

## 2021-06-17 DIAGNOSIS — Z7902 Long term (current) use of antithrombotics/antiplatelets: Secondary | ICD-10-CM | POA: Diagnosis not present

## 2021-06-19 DIAGNOSIS — M79674 Pain in right toe(s): Secondary | ICD-10-CM | POA: Diagnosis not present

## 2021-06-19 DIAGNOSIS — Z9089 Acquired absence of other organs: Secondary | ICD-10-CM | POA: Diagnosis not present

## 2021-06-19 DIAGNOSIS — Z8571 Personal history of Hodgkin lymphoma: Secondary | ICD-10-CM | POA: Diagnosis not present

## 2021-06-19 DIAGNOSIS — Z90710 Acquired absence of both cervix and uterus: Secondary | ICD-10-CM | POA: Diagnosis not present

## 2021-06-19 DIAGNOSIS — G8929 Other chronic pain: Secondary | ICD-10-CM | POA: Diagnosis not present

## 2021-06-19 DIAGNOSIS — M25571 Pain in right ankle and joints of right foot: Secondary | ICD-10-CM | POA: Diagnosis not present

## 2021-06-19 DIAGNOSIS — Z7952 Long term (current) use of systemic steroids: Secondary | ICD-10-CM | POA: Diagnosis not present

## 2021-06-19 DIAGNOSIS — R296 Repeated falls: Secondary | ICD-10-CM | POA: Diagnosis not present

## 2021-06-19 DIAGNOSIS — Z7902 Long term (current) use of antithrombotics/antiplatelets: Secondary | ICD-10-CM | POA: Diagnosis not present

## 2021-06-19 DIAGNOSIS — M79675 Pain in left toe(s): Secondary | ICD-10-CM | POA: Diagnosis not present

## 2021-06-19 DIAGNOSIS — M3501 Sicca syndrome with keratoconjunctivitis: Secondary | ICD-10-CM | POA: Diagnosis not present

## 2021-06-19 DIAGNOSIS — M5417 Radiculopathy, lumbosacral region: Secondary | ICD-10-CM | POA: Diagnosis not present

## 2021-06-19 DIAGNOSIS — M25572 Pain in left ankle and joints of left foot: Secondary | ICD-10-CM | POA: Diagnosis not present

## 2021-06-19 DIAGNOSIS — Z9181 History of falling: Secondary | ICD-10-CM | POA: Diagnosis not present

## 2021-06-19 DIAGNOSIS — E785 Hyperlipidemia, unspecified: Secondary | ICD-10-CM | POA: Diagnosis not present

## 2021-06-19 DIAGNOSIS — Z7982 Long term (current) use of aspirin: Secondary | ICD-10-CM | POA: Diagnosis not present

## 2021-06-20 DIAGNOSIS — M25571 Pain in right ankle and joints of right foot: Secondary | ICD-10-CM | POA: Diagnosis not present

## 2021-06-20 DIAGNOSIS — Z0100 Encounter for examination of eyes and vision without abnormal findings: Secondary | ICD-10-CM | POA: Diagnosis not present

## 2021-06-20 DIAGNOSIS — M5417 Radiculopathy, lumbosacral region: Secondary | ICD-10-CM | POA: Diagnosis not present

## 2021-06-20 DIAGNOSIS — G8929 Other chronic pain: Secondary | ICD-10-CM | POA: Diagnosis not present

## 2021-06-21 DIAGNOSIS — Z7902 Long term (current) use of antithrombotics/antiplatelets: Secondary | ICD-10-CM | POA: Diagnosis not present

## 2021-06-21 DIAGNOSIS — M25571 Pain in right ankle and joints of right foot: Secondary | ICD-10-CM | POA: Diagnosis not present

## 2021-06-21 DIAGNOSIS — Z8571 Personal history of Hodgkin lymphoma: Secondary | ICD-10-CM | POA: Diagnosis not present

## 2021-06-21 DIAGNOSIS — Z9181 History of falling: Secondary | ICD-10-CM | POA: Diagnosis not present

## 2021-06-21 DIAGNOSIS — Z9089 Acquired absence of other organs: Secondary | ICD-10-CM | POA: Diagnosis not present

## 2021-06-21 DIAGNOSIS — Z90710 Acquired absence of both cervix and uterus: Secondary | ICD-10-CM | POA: Diagnosis not present

## 2021-06-21 DIAGNOSIS — M25572 Pain in left ankle and joints of left foot: Secondary | ICD-10-CM | POA: Diagnosis not present

## 2021-06-21 DIAGNOSIS — E785 Hyperlipidemia, unspecified: Secondary | ICD-10-CM | POA: Diagnosis not present

## 2021-06-21 DIAGNOSIS — M5417 Radiculopathy, lumbosacral region: Secondary | ICD-10-CM | POA: Diagnosis not present

## 2021-06-21 DIAGNOSIS — M3501 Sicca syndrome with keratoconjunctivitis: Secondary | ICD-10-CM | POA: Diagnosis not present

## 2021-06-21 DIAGNOSIS — Z7982 Long term (current) use of aspirin: Secondary | ICD-10-CM | POA: Diagnosis not present

## 2021-06-21 DIAGNOSIS — G8929 Other chronic pain: Secondary | ICD-10-CM | POA: Diagnosis not present

## 2021-06-21 DIAGNOSIS — Z7952 Long term (current) use of systemic steroids: Secondary | ICD-10-CM | POA: Diagnosis not present

## 2021-06-21 DIAGNOSIS — M79675 Pain in left toe(s): Secondary | ICD-10-CM | POA: Diagnosis not present

## 2021-06-21 DIAGNOSIS — R296 Repeated falls: Secondary | ICD-10-CM | POA: Diagnosis not present

## 2021-06-21 DIAGNOSIS — M79674 Pain in right toe(s): Secondary | ICD-10-CM | POA: Diagnosis not present

## 2021-06-22 DIAGNOSIS — Z9181 History of falling: Secondary | ICD-10-CM | POA: Diagnosis not present

## 2021-06-22 DIAGNOSIS — M25572 Pain in left ankle and joints of left foot: Secondary | ICD-10-CM | POA: Diagnosis not present

## 2021-06-22 DIAGNOSIS — M25571 Pain in right ankle and joints of right foot: Secondary | ICD-10-CM | POA: Diagnosis not present

## 2021-06-22 DIAGNOSIS — Z90710 Acquired absence of both cervix and uterus: Secondary | ICD-10-CM | POA: Diagnosis not present

## 2021-06-22 DIAGNOSIS — M3501 Sicca syndrome with keratoconjunctivitis: Secondary | ICD-10-CM | POA: Diagnosis not present

## 2021-06-22 DIAGNOSIS — G8929 Other chronic pain: Secondary | ICD-10-CM | POA: Diagnosis not present

## 2021-06-22 DIAGNOSIS — M5417 Radiculopathy, lumbosacral region: Secondary | ICD-10-CM | POA: Diagnosis not present

## 2021-06-22 DIAGNOSIS — E785 Hyperlipidemia, unspecified: Secondary | ICD-10-CM | POA: Diagnosis not present

## 2021-06-22 DIAGNOSIS — Z7902 Long term (current) use of antithrombotics/antiplatelets: Secondary | ICD-10-CM | POA: Diagnosis not present

## 2021-06-22 DIAGNOSIS — Z7952 Long term (current) use of systemic steroids: Secondary | ICD-10-CM | POA: Diagnosis not present

## 2021-06-22 DIAGNOSIS — R296 Repeated falls: Secondary | ICD-10-CM | POA: Diagnosis not present

## 2021-06-22 DIAGNOSIS — M79674 Pain in right toe(s): Secondary | ICD-10-CM | POA: Diagnosis not present

## 2021-06-22 DIAGNOSIS — Z8571 Personal history of Hodgkin lymphoma: Secondary | ICD-10-CM | POA: Diagnosis not present

## 2021-06-22 DIAGNOSIS — Z9089 Acquired absence of other organs: Secondary | ICD-10-CM | POA: Diagnosis not present

## 2021-06-22 DIAGNOSIS — Z7982 Long term (current) use of aspirin: Secondary | ICD-10-CM | POA: Diagnosis not present

## 2021-06-22 DIAGNOSIS — M79675 Pain in left toe(s): Secondary | ICD-10-CM | POA: Diagnosis not present

## 2021-06-24 DIAGNOSIS — Z9089 Acquired absence of other organs: Secondary | ICD-10-CM | POA: Diagnosis not present

## 2021-06-24 DIAGNOSIS — Z9181 History of falling: Secondary | ICD-10-CM | POA: Diagnosis not present

## 2021-06-24 DIAGNOSIS — M25571 Pain in right ankle and joints of right foot: Secondary | ICD-10-CM | POA: Diagnosis not present

## 2021-06-24 DIAGNOSIS — M5417 Radiculopathy, lumbosacral region: Secondary | ICD-10-CM | POA: Diagnosis not present

## 2021-06-24 DIAGNOSIS — Z90710 Acquired absence of both cervix and uterus: Secondary | ICD-10-CM | POA: Diagnosis not present

## 2021-06-24 DIAGNOSIS — Z7952 Long term (current) use of systemic steroids: Secondary | ICD-10-CM | POA: Diagnosis not present

## 2021-06-24 DIAGNOSIS — E785 Hyperlipidemia, unspecified: Secondary | ICD-10-CM | POA: Diagnosis not present

## 2021-06-24 DIAGNOSIS — Z8571 Personal history of Hodgkin lymphoma: Secondary | ICD-10-CM | POA: Diagnosis not present

## 2021-06-24 DIAGNOSIS — Z7982 Long term (current) use of aspirin: Secondary | ICD-10-CM | POA: Diagnosis not present

## 2021-06-24 DIAGNOSIS — G8929 Other chronic pain: Secondary | ICD-10-CM | POA: Diagnosis not present

## 2021-06-24 DIAGNOSIS — M79674 Pain in right toe(s): Secondary | ICD-10-CM | POA: Diagnosis not present

## 2021-06-24 DIAGNOSIS — R296 Repeated falls: Secondary | ICD-10-CM | POA: Diagnosis not present

## 2021-06-24 DIAGNOSIS — M79675 Pain in left toe(s): Secondary | ICD-10-CM | POA: Diagnosis not present

## 2021-06-24 DIAGNOSIS — M25572 Pain in left ankle and joints of left foot: Secondary | ICD-10-CM | POA: Diagnosis not present

## 2021-06-24 DIAGNOSIS — M3501 Sicca syndrome with keratoconjunctivitis: Secondary | ICD-10-CM | POA: Diagnosis not present

## 2021-06-24 DIAGNOSIS — Z7902 Long term (current) use of antithrombotics/antiplatelets: Secondary | ICD-10-CM | POA: Diagnosis not present

## 2021-06-25 DIAGNOSIS — Z90710 Acquired absence of both cervix and uterus: Secondary | ICD-10-CM | POA: Diagnosis not present

## 2021-06-25 DIAGNOSIS — Z9089 Acquired absence of other organs: Secondary | ICD-10-CM | POA: Diagnosis not present

## 2021-06-25 DIAGNOSIS — E785 Hyperlipidemia, unspecified: Secondary | ICD-10-CM | POA: Diagnosis not present

## 2021-06-25 DIAGNOSIS — M25571 Pain in right ankle and joints of right foot: Secondary | ICD-10-CM | POA: Diagnosis not present

## 2021-06-25 DIAGNOSIS — M5417 Radiculopathy, lumbosacral region: Secondary | ICD-10-CM | POA: Diagnosis not present

## 2021-06-25 DIAGNOSIS — G8929 Other chronic pain: Secondary | ICD-10-CM | POA: Diagnosis not present

## 2021-06-25 DIAGNOSIS — Z7902 Long term (current) use of antithrombotics/antiplatelets: Secondary | ICD-10-CM | POA: Diagnosis not present

## 2021-06-25 DIAGNOSIS — Z9181 History of falling: Secondary | ICD-10-CM | POA: Diagnosis not present

## 2021-06-25 DIAGNOSIS — Z8571 Personal history of Hodgkin lymphoma: Secondary | ICD-10-CM | POA: Diagnosis not present

## 2021-06-25 DIAGNOSIS — M79674 Pain in right toe(s): Secondary | ICD-10-CM | POA: Diagnosis not present

## 2021-06-25 DIAGNOSIS — Z7952 Long term (current) use of systemic steroids: Secondary | ICD-10-CM | POA: Diagnosis not present

## 2021-06-25 DIAGNOSIS — M25572 Pain in left ankle and joints of left foot: Secondary | ICD-10-CM | POA: Diagnosis not present

## 2021-06-25 DIAGNOSIS — R296 Repeated falls: Secondary | ICD-10-CM | POA: Diagnosis not present

## 2021-06-25 DIAGNOSIS — Z7982 Long term (current) use of aspirin: Secondary | ICD-10-CM | POA: Diagnosis not present

## 2021-06-25 DIAGNOSIS — M79675 Pain in left toe(s): Secondary | ICD-10-CM | POA: Diagnosis not present

## 2021-06-25 DIAGNOSIS — M3501 Sicca syndrome with keratoconjunctivitis: Secondary | ICD-10-CM | POA: Diagnosis not present

## 2021-06-26 DIAGNOSIS — E785 Hyperlipidemia, unspecified: Secondary | ICD-10-CM | POA: Diagnosis not present

## 2021-06-26 DIAGNOSIS — R296 Repeated falls: Secondary | ICD-10-CM | POA: Diagnosis not present

## 2021-06-26 DIAGNOSIS — Z8571 Personal history of Hodgkin lymphoma: Secondary | ICD-10-CM | POA: Diagnosis not present

## 2021-06-26 DIAGNOSIS — Z9089 Acquired absence of other organs: Secondary | ICD-10-CM | POA: Diagnosis not present

## 2021-06-26 DIAGNOSIS — M25572 Pain in left ankle and joints of left foot: Secondary | ICD-10-CM | POA: Diagnosis not present

## 2021-06-26 DIAGNOSIS — M79674 Pain in right toe(s): Secondary | ICD-10-CM | POA: Diagnosis not present

## 2021-06-26 DIAGNOSIS — M5417 Radiculopathy, lumbosacral region: Secondary | ICD-10-CM | POA: Diagnosis not present

## 2021-06-26 DIAGNOSIS — M3501 Sicca syndrome with keratoconjunctivitis: Secondary | ICD-10-CM | POA: Diagnosis not present

## 2021-06-26 DIAGNOSIS — Z7982 Long term (current) use of aspirin: Secondary | ICD-10-CM | POA: Diagnosis not present

## 2021-06-26 DIAGNOSIS — Z7902 Long term (current) use of antithrombotics/antiplatelets: Secondary | ICD-10-CM | POA: Diagnosis not present

## 2021-06-26 DIAGNOSIS — Z9181 History of falling: Secondary | ICD-10-CM | POA: Diagnosis not present

## 2021-06-26 DIAGNOSIS — Z90710 Acquired absence of both cervix and uterus: Secondary | ICD-10-CM | POA: Diagnosis not present

## 2021-06-26 DIAGNOSIS — M25571 Pain in right ankle and joints of right foot: Secondary | ICD-10-CM | POA: Diagnosis not present

## 2021-06-26 DIAGNOSIS — G8929 Other chronic pain: Secondary | ICD-10-CM | POA: Diagnosis not present

## 2021-06-26 DIAGNOSIS — Z7952 Long term (current) use of systemic steroids: Secondary | ICD-10-CM | POA: Diagnosis not present

## 2021-06-26 DIAGNOSIS — M79675 Pain in left toe(s): Secondary | ICD-10-CM | POA: Diagnosis not present

## 2021-07-01 DIAGNOSIS — Z7982 Long term (current) use of aspirin: Secondary | ICD-10-CM | POA: Diagnosis not present

## 2021-07-01 DIAGNOSIS — M25571 Pain in right ankle and joints of right foot: Secondary | ICD-10-CM | POA: Diagnosis not present

## 2021-07-01 DIAGNOSIS — Z8571 Personal history of Hodgkin lymphoma: Secondary | ICD-10-CM | POA: Diagnosis not present

## 2021-07-01 DIAGNOSIS — R296 Repeated falls: Secondary | ICD-10-CM | POA: Diagnosis not present

## 2021-07-01 DIAGNOSIS — M5417 Radiculopathy, lumbosacral region: Secondary | ICD-10-CM | POA: Diagnosis not present

## 2021-07-01 DIAGNOSIS — Z7902 Long term (current) use of antithrombotics/antiplatelets: Secondary | ICD-10-CM | POA: Diagnosis not present

## 2021-07-01 DIAGNOSIS — M25572 Pain in left ankle and joints of left foot: Secondary | ICD-10-CM | POA: Diagnosis not present

## 2021-07-01 DIAGNOSIS — E785 Hyperlipidemia, unspecified: Secondary | ICD-10-CM | POA: Diagnosis not present

## 2021-07-01 DIAGNOSIS — M3501 Sicca syndrome with keratoconjunctivitis: Secondary | ICD-10-CM | POA: Diagnosis not present

## 2021-07-01 DIAGNOSIS — M79674 Pain in right toe(s): Secondary | ICD-10-CM | POA: Diagnosis not present

## 2021-07-01 DIAGNOSIS — Z7952 Long term (current) use of systemic steroids: Secondary | ICD-10-CM | POA: Diagnosis not present

## 2021-07-01 DIAGNOSIS — M79675 Pain in left toe(s): Secondary | ICD-10-CM | POA: Diagnosis not present

## 2021-07-01 DIAGNOSIS — G8929 Other chronic pain: Secondary | ICD-10-CM | POA: Diagnosis not present

## 2021-07-01 DIAGNOSIS — Z90710 Acquired absence of both cervix and uterus: Secondary | ICD-10-CM | POA: Diagnosis not present

## 2021-07-01 DIAGNOSIS — Z9181 History of falling: Secondary | ICD-10-CM | POA: Diagnosis not present

## 2021-07-01 DIAGNOSIS — Z9089 Acquired absence of other organs: Secondary | ICD-10-CM | POA: Diagnosis not present

## 2021-07-03 DIAGNOSIS — M79675 Pain in left toe(s): Secondary | ICD-10-CM | POA: Diagnosis not present

## 2021-07-03 DIAGNOSIS — Z90710 Acquired absence of both cervix and uterus: Secondary | ICD-10-CM | POA: Diagnosis not present

## 2021-07-03 DIAGNOSIS — M79674 Pain in right toe(s): Secondary | ICD-10-CM | POA: Diagnosis not present

## 2021-07-03 DIAGNOSIS — Z7952 Long term (current) use of systemic steroids: Secondary | ICD-10-CM | POA: Diagnosis not present

## 2021-07-03 DIAGNOSIS — M3501 Sicca syndrome with keratoconjunctivitis: Secondary | ICD-10-CM | POA: Diagnosis not present

## 2021-07-03 DIAGNOSIS — Z9181 History of falling: Secondary | ICD-10-CM | POA: Diagnosis not present

## 2021-07-03 DIAGNOSIS — G8929 Other chronic pain: Secondary | ICD-10-CM | POA: Diagnosis not present

## 2021-07-03 DIAGNOSIS — M25572 Pain in left ankle and joints of left foot: Secondary | ICD-10-CM | POA: Diagnosis not present

## 2021-07-03 DIAGNOSIS — M5417 Radiculopathy, lumbosacral region: Secondary | ICD-10-CM | POA: Diagnosis not present

## 2021-07-03 DIAGNOSIS — M25571 Pain in right ankle and joints of right foot: Secondary | ICD-10-CM | POA: Diagnosis not present

## 2021-07-03 DIAGNOSIS — Z7902 Long term (current) use of antithrombotics/antiplatelets: Secondary | ICD-10-CM | POA: Diagnosis not present

## 2021-07-03 DIAGNOSIS — R296 Repeated falls: Secondary | ICD-10-CM | POA: Diagnosis not present

## 2021-07-03 DIAGNOSIS — E785 Hyperlipidemia, unspecified: Secondary | ICD-10-CM | POA: Diagnosis not present

## 2021-07-03 DIAGNOSIS — Z9089 Acquired absence of other organs: Secondary | ICD-10-CM | POA: Diagnosis not present

## 2021-07-03 DIAGNOSIS — Z7982 Long term (current) use of aspirin: Secondary | ICD-10-CM | POA: Diagnosis not present

## 2021-07-03 DIAGNOSIS — Z8571 Personal history of Hodgkin lymphoma: Secondary | ICD-10-CM | POA: Diagnosis not present

## 2021-07-10 DIAGNOSIS — Z9181 History of falling: Secondary | ICD-10-CM | POA: Diagnosis not present

## 2021-07-10 DIAGNOSIS — E785 Hyperlipidemia, unspecified: Secondary | ICD-10-CM | POA: Diagnosis not present

## 2021-07-10 DIAGNOSIS — Z7902 Long term (current) use of antithrombotics/antiplatelets: Secondary | ICD-10-CM | POA: Diagnosis not present

## 2021-07-10 DIAGNOSIS — M25571 Pain in right ankle and joints of right foot: Secondary | ICD-10-CM | POA: Diagnosis not present

## 2021-07-10 DIAGNOSIS — M25572 Pain in left ankle and joints of left foot: Secondary | ICD-10-CM | POA: Diagnosis not present

## 2021-07-10 DIAGNOSIS — M3501 Sicca syndrome with keratoconjunctivitis: Secondary | ICD-10-CM | POA: Diagnosis not present

## 2021-07-10 DIAGNOSIS — Z7982 Long term (current) use of aspirin: Secondary | ICD-10-CM | POA: Diagnosis not present

## 2021-07-10 DIAGNOSIS — Z90710 Acquired absence of both cervix and uterus: Secondary | ICD-10-CM | POA: Diagnosis not present

## 2021-07-10 DIAGNOSIS — G8929 Other chronic pain: Secondary | ICD-10-CM | POA: Diagnosis not present

## 2021-07-10 DIAGNOSIS — M79674 Pain in right toe(s): Secondary | ICD-10-CM | POA: Diagnosis not present

## 2021-07-10 DIAGNOSIS — Z7952 Long term (current) use of systemic steroids: Secondary | ICD-10-CM | POA: Diagnosis not present

## 2021-07-10 DIAGNOSIS — R296 Repeated falls: Secondary | ICD-10-CM | POA: Diagnosis not present

## 2021-07-10 DIAGNOSIS — Z9089 Acquired absence of other organs: Secondary | ICD-10-CM | POA: Diagnosis not present

## 2021-07-10 DIAGNOSIS — M79675 Pain in left toe(s): Secondary | ICD-10-CM | POA: Diagnosis not present

## 2021-07-10 DIAGNOSIS — Z8571 Personal history of Hodgkin lymphoma: Secondary | ICD-10-CM | POA: Diagnosis not present

## 2021-07-10 DIAGNOSIS — M5417 Radiculopathy, lumbosacral region: Secondary | ICD-10-CM | POA: Diagnosis not present

## 2021-07-11 DIAGNOSIS — M25571 Pain in right ankle and joints of right foot: Secondary | ICD-10-CM | POA: Diagnosis not present

## 2021-07-11 DIAGNOSIS — M5417 Radiculopathy, lumbosacral region: Secondary | ICD-10-CM | POA: Diagnosis not present

## 2021-07-11 DIAGNOSIS — M3501 Sicca syndrome with keratoconjunctivitis: Secondary | ICD-10-CM | POA: Diagnosis not present

## 2021-07-11 DIAGNOSIS — Z7902 Long term (current) use of antithrombotics/antiplatelets: Secondary | ICD-10-CM | POA: Diagnosis not present

## 2021-07-11 DIAGNOSIS — G8929 Other chronic pain: Secondary | ICD-10-CM | POA: Diagnosis not present

## 2021-07-11 DIAGNOSIS — Z7952 Long term (current) use of systemic steroids: Secondary | ICD-10-CM | POA: Diagnosis not present

## 2021-07-11 DIAGNOSIS — Z9181 History of falling: Secondary | ICD-10-CM | POA: Diagnosis not present

## 2021-07-11 DIAGNOSIS — R296 Repeated falls: Secondary | ICD-10-CM | POA: Diagnosis not present

## 2021-07-11 DIAGNOSIS — Z90710 Acquired absence of both cervix and uterus: Secondary | ICD-10-CM | POA: Diagnosis not present

## 2021-07-11 DIAGNOSIS — Z9089 Acquired absence of other organs: Secondary | ICD-10-CM | POA: Diagnosis not present

## 2021-07-11 DIAGNOSIS — M79674 Pain in right toe(s): Secondary | ICD-10-CM | POA: Diagnosis not present

## 2021-07-11 DIAGNOSIS — M25572 Pain in left ankle and joints of left foot: Secondary | ICD-10-CM | POA: Diagnosis not present

## 2021-07-11 DIAGNOSIS — M79675 Pain in left toe(s): Secondary | ICD-10-CM | POA: Diagnosis not present

## 2021-07-11 DIAGNOSIS — Z8571 Personal history of Hodgkin lymphoma: Secondary | ICD-10-CM | POA: Diagnosis not present

## 2021-07-11 DIAGNOSIS — E785 Hyperlipidemia, unspecified: Secondary | ICD-10-CM | POA: Diagnosis not present

## 2021-07-11 DIAGNOSIS — Z7982 Long term (current) use of aspirin: Secondary | ICD-10-CM | POA: Diagnosis not present

## 2021-07-12 DIAGNOSIS — M79674 Pain in right toe(s): Secondary | ICD-10-CM | POA: Diagnosis not present

## 2021-07-12 DIAGNOSIS — Z7982 Long term (current) use of aspirin: Secondary | ICD-10-CM | POA: Diagnosis not present

## 2021-07-12 DIAGNOSIS — Z7902 Long term (current) use of antithrombotics/antiplatelets: Secondary | ICD-10-CM | POA: Diagnosis not present

## 2021-07-12 DIAGNOSIS — M25572 Pain in left ankle and joints of left foot: Secondary | ICD-10-CM | POA: Diagnosis not present

## 2021-07-12 DIAGNOSIS — E785 Hyperlipidemia, unspecified: Secondary | ICD-10-CM | POA: Diagnosis not present

## 2021-07-12 DIAGNOSIS — Z9089 Acquired absence of other organs: Secondary | ICD-10-CM | POA: Diagnosis not present

## 2021-07-12 DIAGNOSIS — M25571 Pain in right ankle and joints of right foot: Secondary | ICD-10-CM | POA: Diagnosis not present

## 2021-07-12 DIAGNOSIS — M79675 Pain in left toe(s): Secondary | ICD-10-CM | POA: Diagnosis not present

## 2021-07-12 DIAGNOSIS — M5417 Radiculopathy, lumbosacral region: Secondary | ICD-10-CM | POA: Diagnosis not present

## 2021-07-12 DIAGNOSIS — R296 Repeated falls: Secondary | ICD-10-CM | POA: Diagnosis not present

## 2021-07-12 DIAGNOSIS — G8929 Other chronic pain: Secondary | ICD-10-CM | POA: Diagnosis not present

## 2021-07-12 DIAGNOSIS — Z7952 Long term (current) use of systemic steroids: Secondary | ICD-10-CM | POA: Diagnosis not present

## 2021-07-12 DIAGNOSIS — Z90710 Acquired absence of both cervix and uterus: Secondary | ICD-10-CM | POA: Diagnosis not present

## 2021-07-12 DIAGNOSIS — M3501 Sicca syndrome with keratoconjunctivitis: Secondary | ICD-10-CM | POA: Diagnosis not present

## 2021-07-12 DIAGNOSIS — Z9181 History of falling: Secondary | ICD-10-CM | POA: Diagnosis not present

## 2021-07-12 DIAGNOSIS — Z8571 Personal history of Hodgkin lymphoma: Secondary | ICD-10-CM | POA: Diagnosis not present

## 2021-07-17 DIAGNOSIS — M25571 Pain in right ankle and joints of right foot: Secondary | ICD-10-CM | POA: Diagnosis not present

## 2021-07-17 DIAGNOSIS — M3501 Sicca syndrome with keratoconjunctivitis: Secondary | ICD-10-CM | POA: Diagnosis not present

## 2021-07-17 DIAGNOSIS — Z9181 History of falling: Secondary | ICD-10-CM | POA: Diagnosis not present

## 2021-07-17 DIAGNOSIS — Z7952 Long term (current) use of systemic steroids: Secondary | ICD-10-CM | POA: Diagnosis not present

## 2021-07-17 DIAGNOSIS — M5417 Radiculopathy, lumbosacral region: Secondary | ICD-10-CM | POA: Diagnosis not present

## 2021-07-17 DIAGNOSIS — R296 Repeated falls: Secondary | ICD-10-CM | POA: Diagnosis not present

## 2021-07-17 DIAGNOSIS — E785 Hyperlipidemia, unspecified: Secondary | ICD-10-CM | POA: Diagnosis not present

## 2021-07-17 DIAGNOSIS — M79675 Pain in left toe(s): Secondary | ICD-10-CM | POA: Diagnosis not present

## 2021-07-17 DIAGNOSIS — M25572 Pain in left ankle and joints of left foot: Secondary | ICD-10-CM | POA: Diagnosis not present

## 2021-07-17 DIAGNOSIS — Z7902 Long term (current) use of antithrombotics/antiplatelets: Secondary | ICD-10-CM | POA: Diagnosis not present

## 2021-07-17 DIAGNOSIS — Z90710 Acquired absence of both cervix and uterus: Secondary | ICD-10-CM | POA: Diagnosis not present

## 2021-07-17 DIAGNOSIS — M79674 Pain in right toe(s): Secondary | ICD-10-CM | POA: Diagnosis not present

## 2021-07-17 DIAGNOSIS — Z7982 Long term (current) use of aspirin: Secondary | ICD-10-CM | POA: Diagnosis not present

## 2021-07-17 DIAGNOSIS — G8929 Other chronic pain: Secondary | ICD-10-CM | POA: Diagnosis not present

## 2021-07-17 DIAGNOSIS — Z8571 Personal history of Hodgkin lymphoma: Secondary | ICD-10-CM | POA: Diagnosis not present

## 2021-07-17 DIAGNOSIS — Z9089 Acquired absence of other organs: Secondary | ICD-10-CM | POA: Diagnosis not present

## 2021-07-22 DIAGNOSIS — Z7952 Long term (current) use of systemic steroids: Secondary | ICD-10-CM | POA: Diagnosis not present

## 2021-07-22 DIAGNOSIS — M3501 Sicca syndrome with keratoconjunctivitis: Secondary | ICD-10-CM | POA: Diagnosis not present

## 2021-07-22 DIAGNOSIS — M25571 Pain in right ankle and joints of right foot: Secondary | ICD-10-CM | POA: Diagnosis not present

## 2021-07-22 DIAGNOSIS — R296 Repeated falls: Secondary | ICD-10-CM | POA: Diagnosis not present

## 2021-07-22 DIAGNOSIS — M79675 Pain in left toe(s): Secondary | ICD-10-CM | POA: Diagnosis not present

## 2021-07-22 DIAGNOSIS — Z7902 Long term (current) use of antithrombotics/antiplatelets: Secondary | ICD-10-CM | POA: Diagnosis not present

## 2021-07-22 DIAGNOSIS — M5417 Radiculopathy, lumbosacral region: Secondary | ICD-10-CM | POA: Diagnosis not present

## 2021-07-22 DIAGNOSIS — Z9089 Acquired absence of other organs: Secondary | ICD-10-CM | POA: Diagnosis not present

## 2021-07-22 DIAGNOSIS — Z90710 Acquired absence of both cervix and uterus: Secondary | ICD-10-CM | POA: Diagnosis not present

## 2021-07-22 DIAGNOSIS — M79674 Pain in right toe(s): Secondary | ICD-10-CM | POA: Diagnosis not present

## 2021-07-22 DIAGNOSIS — Z7982 Long term (current) use of aspirin: Secondary | ICD-10-CM | POA: Diagnosis not present

## 2021-07-22 DIAGNOSIS — Z9181 History of falling: Secondary | ICD-10-CM | POA: Diagnosis not present

## 2021-07-22 DIAGNOSIS — G8929 Other chronic pain: Secondary | ICD-10-CM | POA: Diagnosis not present

## 2021-07-22 DIAGNOSIS — Z8571 Personal history of Hodgkin lymphoma: Secondary | ICD-10-CM | POA: Diagnosis not present

## 2021-07-22 DIAGNOSIS — M25572 Pain in left ankle and joints of left foot: Secondary | ICD-10-CM | POA: Diagnosis not present

## 2021-07-22 DIAGNOSIS — E785 Hyperlipidemia, unspecified: Secondary | ICD-10-CM | POA: Diagnosis not present

## 2021-07-30 DIAGNOSIS — D485 Neoplasm of uncertain behavior of skin: Secondary | ICD-10-CM | POA: Diagnosis not present

## 2021-08-15 DIAGNOSIS — S0570XA Avulsion of unspecified eye, initial encounter: Secondary | ICD-10-CM | POA: Diagnosis not present

## 2021-08-16 ENCOUNTER — Other Ambulatory Visit: Payer: Self-pay | Admitting: Cardiology

## 2021-08-16 DIAGNOSIS — Z79891 Long term (current) use of opiate analgesic: Secondary | ICD-10-CM | POA: Diagnosis not present

## 2021-08-16 DIAGNOSIS — S6992XA Unspecified injury of left wrist, hand and finger(s), initial encounter: Secondary | ICD-10-CM | POA: Diagnosis not present

## 2021-08-16 DIAGNOSIS — Z741 Need for assistance with personal care: Secondary | ICD-10-CM | POA: Diagnosis not present

## 2021-08-16 DIAGNOSIS — J449 Chronic obstructive pulmonary disease, unspecified: Secondary | ICD-10-CM | POA: Diagnosis not present

## 2021-08-16 DIAGNOSIS — S39012A Strain of muscle, fascia and tendon of lower back, initial encounter: Secondary | ICD-10-CM | POA: Diagnosis not present

## 2021-08-16 DIAGNOSIS — E0590 Thyrotoxicosis, unspecified without thyrotoxic crisis or storm: Secondary | ICD-10-CM | POA: Diagnosis not present

## 2021-08-16 DIAGNOSIS — S62002A Unspecified fracture of navicular [scaphoid] bone of left wrist, initial encounter for closed fracture: Secondary | ICD-10-CM | POA: Diagnosis not present

## 2021-08-16 DIAGNOSIS — Z888 Allergy status to other drugs, medicaments and biological substances status: Secondary | ICD-10-CM | POA: Diagnosis not present

## 2021-08-16 DIAGNOSIS — S62025A Nondisplaced fracture of middle third of navicular [scaphoid] bone of left wrist, initial encounter for closed fracture: Secondary | ICD-10-CM | POA: Diagnosis not present

## 2021-08-16 DIAGNOSIS — Z7952 Long term (current) use of systemic steroids: Secondary | ICD-10-CM | POA: Diagnosis not present

## 2021-08-16 DIAGNOSIS — M8588 Other specified disorders of bone density and structure, other site: Secondary | ICD-10-CM | POA: Diagnosis not present

## 2021-08-16 DIAGNOSIS — D649 Anemia, unspecified: Secondary | ICD-10-CM | POA: Diagnosis not present

## 2021-08-16 DIAGNOSIS — Z7982 Long term (current) use of aspirin: Secondary | ICD-10-CM | POA: Diagnosis not present

## 2021-08-16 DIAGNOSIS — E860 Dehydration: Secondary | ICD-10-CM | POA: Diagnosis not present

## 2021-08-16 DIAGNOSIS — M545 Low back pain, unspecified: Secondary | ICD-10-CM | POA: Diagnosis not present

## 2021-08-16 DIAGNOSIS — R69 Illness, unspecified: Secondary | ICD-10-CM | POA: Diagnosis not present

## 2021-08-16 DIAGNOSIS — W19XXXA Unspecified fall, initial encounter: Secondary | ICD-10-CM | POA: Diagnosis not present

## 2021-08-16 DIAGNOSIS — W01190A Fall on same level from slipping, tripping and stumbling with subsequent striking against furniture, initial encounter: Secondary | ICD-10-CM | POA: Diagnosis not present

## 2021-08-16 DIAGNOSIS — R339 Retention of urine, unspecified: Secondary | ICD-10-CM | POA: Diagnosis not present

## 2021-08-16 DIAGNOSIS — Z79899 Other long term (current) drug therapy: Secondary | ICD-10-CM | POA: Diagnosis not present

## 2021-08-16 DIAGNOSIS — R531 Weakness: Secondary | ICD-10-CM | POA: Diagnosis not present

## 2021-08-16 DIAGNOSIS — N179 Acute kidney failure, unspecified: Secondary | ICD-10-CM | POA: Diagnosis not present

## 2021-08-16 DIAGNOSIS — Z2821 Immunization not carried out because of patient refusal: Secondary | ICD-10-CM | POA: Diagnosis not present

## 2021-08-16 DIAGNOSIS — M810 Age-related osteoporosis without current pathological fracture: Secondary | ICD-10-CM | POA: Diagnosis not present

## 2021-08-16 DIAGNOSIS — R609 Edema, unspecified: Secondary | ICD-10-CM | POA: Diagnosis not present

## 2021-08-16 DIAGNOSIS — E7800 Pure hypercholesterolemia, unspecified: Secondary | ICD-10-CM | POA: Diagnosis not present

## 2021-08-17 DIAGNOSIS — J449 Chronic obstructive pulmonary disease, unspecified: Secondary | ICD-10-CM | POA: Diagnosis not present

## 2021-08-17 DIAGNOSIS — S62002A Unspecified fracture of navicular [scaphoid] bone of left wrist, initial encounter for closed fracture: Secondary | ICD-10-CM | POA: Diagnosis not present

## 2021-08-17 DIAGNOSIS — R531 Weakness: Secondary | ICD-10-CM | POA: Diagnosis not present

## 2021-08-17 DIAGNOSIS — S39012A Strain of muscle, fascia and tendon of lower back, initial encounter: Secondary | ICD-10-CM | POA: Diagnosis not present

## 2021-08-17 DIAGNOSIS — N179 Acute kidney failure, unspecified: Secondary | ICD-10-CM | POA: Diagnosis not present

## 2021-08-18 DIAGNOSIS — N179 Acute kidney failure, unspecified: Secondary | ICD-10-CM | POA: Diagnosis not present

## 2021-08-18 DIAGNOSIS — S39012A Strain of muscle, fascia and tendon of lower back, initial encounter: Secondary | ICD-10-CM | POA: Diagnosis not present

## 2021-08-18 DIAGNOSIS — S62002A Unspecified fracture of navicular [scaphoid] bone of left wrist, initial encounter for closed fracture: Secondary | ICD-10-CM | POA: Diagnosis not present

## 2021-08-19 DIAGNOSIS — N179 Acute kidney failure, unspecified: Secondary | ICD-10-CM | POA: Diagnosis not present

## 2021-08-19 DIAGNOSIS — S39012A Strain of muscle, fascia and tendon of lower back, initial encounter: Secondary | ICD-10-CM | POA: Diagnosis not present

## 2021-08-19 DIAGNOSIS — S62002A Unspecified fracture of navicular [scaphoid] bone of left wrist, initial encounter for closed fracture: Secondary | ICD-10-CM | POA: Diagnosis not present

## 2021-08-20 DIAGNOSIS — N179 Acute kidney failure, unspecified: Secondary | ICD-10-CM | POA: Diagnosis not present

## 2021-08-20 DIAGNOSIS — S39012A Strain of muscle, fascia and tendon of lower back, initial encounter: Secondary | ICD-10-CM | POA: Diagnosis not present

## 2021-08-20 DIAGNOSIS — S62002A Unspecified fracture of navicular [scaphoid] bone of left wrist, initial encounter for closed fracture: Secondary | ICD-10-CM | POA: Diagnosis not present

## 2021-08-21 DIAGNOSIS — R2689 Other abnormalities of gait and mobility: Secondary | ICD-10-CM | POA: Diagnosis not present

## 2021-08-21 DIAGNOSIS — M11232 Other chondrocalcinosis, left wrist: Secondary | ICD-10-CM | POA: Diagnosis not present

## 2021-08-21 DIAGNOSIS — R6339 Other feeding difficulties: Secondary | ICD-10-CM | POA: Diagnosis not present

## 2021-08-21 DIAGNOSIS — R69 Illness, unspecified: Secondary | ICD-10-CM | POA: Diagnosis not present

## 2021-08-21 DIAGNOSIS — R339 Retention of urine, unspecified: Secondary | ICD-10-CM | POA: Diagnosis not present

## 2021-08-21 DIAGNOSIS — S62002A Unspecified fracture of navicular [scaphoid] bone of left wrist, initial encounter for closed fracture: Secondary | ICD-10-CM | POA: Diagnosis not present

## 2021-08-21 DIAGNOSIS — R278 Other lack of coordination: Secondary | ICD-10-CM | POA: Diagnosis not present

## 2021-08-21 DIAGNOSIS — D649 Anemia, unspecified: Secondary | ICD-10-CM | POA: Diagnosis not present

## 2021-08-21 DIAGNOSIS — M8580 Other specified disorders of bone density and structure, unspecified site: Secondary | ICD-10-CM | POA: Diagnosis not present

## 2021-08-21 DIAGNOSIS — M419 Scoliosis, unspecified: Secondary | ICD-10-CM | POA: Diagnosis not present

## 2021-08-21 DIAGNOSIS — I7143 Infrarenal abdominal aortic aneurysm, without rupture: Secondary | ICD-10-CM | POA: Diagnosis not present

## 2021-08-21 DIAGNOSIS — Z7401 Bed confinement status: Secondary | ICD-10-CM | POA: Diagnosis not present

## 2021-08-21 DIAGNOSIS — N179 Acute kidney failure, unspecified: Secondary | ICD-10-CM | POA: Diagnosis not present

## 2021-08-21 DIAGNOSIS — S39012A Strain of muscle, fascia and tendon of lower back, initial encounter: Secondary | ICD-10-CM | POA: Diagnosis not present

## 2021-08-21 DIAGNOSIS — E785 Hyperlipidemia, unspecified: Secondary | ICD-10-CM | POA: Diagnosis not present

## 2021-08-21 DIAGNOSIS — R55 Syncope and collapse: Secondary | ICD-10-CM | POA: Diagnosis not present

## 2021-08-21 DIAGNOSIS — R262 Difficulty in walking, not elsewhere classified: Secondary | ICD-10-CM | POA: Diagnosis not present

## 2021-08-21 DIAGNOSIS — R279 Unspecified lack of coordination: Secondary | ICD-10-CM | POA: Diagnosis not present

## 2021-08-21 DIAGNOSIS — S62001D Unspecified fracture of navicular [scaphoid] bone of right wrist, subsequent encounter for fracture with routine healing: Secondary | ICD-10-CM | POA: Diagnosis not present

## 2021-08-21 DIAGNOSIS — S39012D Strain of muscle, fascia and tendon of lower back, subsequent encounter: Secondary | ICD-10-CM | POA: Diagnosis not present

## 2021-08-21 DIAGNOSIS — E0500 Thyrotoxicosis with diffuse goiter without thyrotoxic crisis or storm: Secondary | ICD-10-CM | POA: Diagnosis not present

## 2021-08-21 DIAGNOSIS — M545 Low back pain, unspecified: Secondary | ICD-10-CM | POA: Diagnosis not present

## 2021-08-21 DIAGNOSIS — Z741 Need for assistance with personal care: Secondary | ICD-10-CM | POA: Diagnosis not present

## 2021-08-21 DIAGNOSIS — I700 Atherosclerosis of aorta: Secondary | ICD-10-CM | POA: Diagnosis not present

## 2021-09-05 DIAGNOSIS — L89151 Pressure ulcer of sacral region, stage 1: Secondary | ICD-10-CM | POA: Diagnosis not present

## 2021-09-12 DIAGNOSIS — L89151 Pressure ulcer of sacral region, stage 1: Secondary | ICD-10-CM | POA: Diagnosis not present

## 2021-09-12 DIAGNOSIS — S81811D Laceration without foreign body, right lower leg, subsequent encounter: Secondary | ICD-10-CM | POA: Diagnosis not present

## 2021-09-19 DIAGNOSIS — S81811D Laceration without foreign body, right lower leg, subsequent encounter: Secondary | ICD-10-CM | POA: Diagnosis not present

## 2021-09-19 DIAGNOSIS — S62002A Unspecified fracture of navicular [scaphoid] bone of left wrist, initial encounter for closed fracture: Secondary | ICD-10-CM | POA: Diagnosis not present

## 2021-09-19 DIAGNOSIS — L89151 Pressure ulcer of sacral region, stage 1: Secondary | ICD-10-CM | POA: Diagnosis not present

## 2021-09-25 DIAGNOSIS — S81802D Unspecified open wound, left lower leg, subsequent encounter: Secondary | ICD-10-CM | POA: Diagnosis not present

## 2021-09-25 DIAGNOSIS — N3281 Overactive bladder: Secondary | ICD-10-CM | POA: Diagnosis not present

## 2021-09-28 DIAGNOSIS — R6883 Chills (without fever): Secondary | ICD-10-CM | POA: Diagnosis not present

## 2021-09-28 DIAGNOSIS — E7800 Pure hypercholesterolemia, unspecified: Secondary | ICD-10-CM | POA: Diagnosis not present

## 2021-09-28 DIAGNOSIS — R609 Edema, unspecified: Secondary | ICD-10-CM | POA: Diagnosis not present

## 2021-09-28 DIAGNOSIS — D509 Iron deficiency anemia, unspecified: Secondary | ICD-10-CM | POA: Diagnosis not present

## 2021-09-28 DIAGNOSIS — N179 Acute kidney failure, unspecified: Secondary | ICD-10-CM | POA: Diagnosis not present

## 2021-09-28 DIAGNOSIS — L03116 Cellulitis of left lower limb: Secondary | ICD-10-CM | POA: Diagnosis not present

## 2021-09-28 DIAGNOSIS — Z888 Allergy status to other drugs, medicaments and biological substances status: Secondary | ICD-10-CM | POA: Diagnosis not present

## 2021-09-28 DIAGNOSIS — E039 Hypothyroidism, unspecified: Secondary | ICD-10-CM | POA: Diagnosis not present

## 2021-09-28 DIAGNOSIS — F419 Anxiety disorder, unspecified: Secondary | ICD-10-CM | POA: Diagnosis not present

## 2021-09-28 DIAGNOSIS — D696 Thrombocytopenia, unspecified: Secondary | ICD-10-CM | POA: Diagnosis not present

## 2021-09-28 DIAGNOSIS — R079 Chest pain, unspecified: Secondary | ICD-10-CM | POA: Diagnosis not present

## 2021-09-28 DIAGNOSIS — I21A1 Myocardial infarction type 2: Secondary | ICD-10-CM | POA: Diagnosis not present

## 2021-09-28 DIAGNOSIS — L03818 Cellulitis of other sites: Secondary | ICD-10-CM | POA: Diagnosis not present

## 2021-09-28 DIAGNOSIS — Z952 Presence of prosthetic heart valve: Secondary | ICD-10-CM | POA: Diagnosis not present

## 2021-09-28 DIAGNOSIS — S81852A Open bite, left lower leg, initial encounter: Secondary | ICD-10-CM | POA: Diagnosis not present

## 2021-09-28 DIAGNOSIS — Z743 Need for continuous supervision: Secondary | ICD-10-CM | POA: Diagnosis not present

## 2021-09-28 DIAGNOSIS — I10 Essential (primary) hypertension: Secondary | ICD-10-CM | POA: Diagnosis not present

## 2021-09-28 DIAGNOSIS — A280 Pasteurellosis: Secondary | ICD-10-CM | POA: Diagnosis not present

## 2021-09-28 DIAGNOSIS — M549 Dorsalgia, unspecified: Secondary | ICD-10-CM | POA: Diagnosis not present

## 2021-09-28 DIAGNOSIS — Z7982 Long term (current) use of aspirin: Secondary | ICD-10-CM | POA: Diagnosis not present

## 2021-09-28 DIAGNOSIS — A419 Sepsis, unspecified organism: Secondary | ICD-10-CM | POA: Diagnosis not present

## 2021-09-28 DIAGNOSIS — Z792 Long term (current) use of antibiotics: Secondary | ICD-10-CM | POA: Diagnosis not present

## 2021-09-28 DIAGNOSIS — Z79899 Other long term (current) drug therapy: Secondary | ICD-10-CM | POA: Diagnosis not present

## 2021-09-28 DIAGNOSIS — R0902 Hypoxemia: Secondary | ICD-10-CM | POA: Diagnosis not present

## 2021-09-28 DIAGNOSIS — L89152 Pressure ulcer of sacral region, stage 2: Secondary | ICD-10-CM | POA: Diagnosis not present

## 2021-09-28 DIAGNOSIS — R6520 Severe sepsis without septic shock: Secondary | ICD-10-CM | POA: Diagnosis not present

## 2021-09-28 DIAGNOSIS — N390 Urinary tract infection, site not specified: Secondary | ICD-10-CM | POA: Diagnosis not present

## 2021-09-28 DIAGNOSIS — R410 Disorientation, unspecified: Secondary | ICD-10-CM | POA: Diagnosis not present

## 2021-09-28 DIAGNOSIS — W5501XA Bitten by cat, initial encounter: Secondary | ICD-10-CM | POA: Diagnosis not present

## 2021-10-04 DIAGNOSIS — L89151 Pressure ulcer of sacral region, stage 1: Secondary | ICD-10-CM | POA: Diagnosis not present

## 2021-10-04 DIAGNOSIS — S81811D Laceration without foreign body, right lower leg, subsequent encounter: Secondary | ICD-10-CM | POA: Diagnosis not present

## 2021-10-06 DIAGNOSIS — W5501XA Bitten by cat, initial encounter: Secondary | ICD-10-CM | POA: Diagnosis not present

## 2021-10-06 DIAGNOSIS — M79662 Pain in left lower leg: Secondary | ICD-10-CM | POA: Diagnosis not present

## 2021-10-06 DIAGNOSIS — R600 Localized edema: Secondary | ICD-10-CM | POA: Diagnosis not present

## 2021-10-06 DIAGNOSIS — S8012XA Contusion of left lower leg, initial encounter: Secondary | ICD-10-CM | POA: Diagnosis not present

## 2021-10-09 DIAGNOSIS — S81811D Laceration without foreign body, right lower leg, subsequent encounter: Secondary | ICD-10-CM | POA: Diagnosis not present

## 2021-10-09 DIAGNOSIS — L89151 Pressure ulcer of sacral region, stage 1: Secondary | ICD-10-CM | POA: Diagnosis not present

## 2021-10-15 DIAGNOSIS — Z9181 History of falling: Secondary | ICD-10-CM | POA: Diagnosis not present

## 2021-10-15 DIAGNOSIS — I110 Hypertensive heart disease with heart failure: Secondary | ICD-10-CM | POA: Diagnosis not present

## 2021-10-15 DIAGNOSIS — G8921 Chronic pain due to trauma: Secondary | ICD-10-CM | POA: Diagnosis not present

## 2021-10-15 DIAGNOSIS — Z09 Encounter for follow-up examination after completed treatment for conditions other than malignant neoplasm: Secondary | ICD-10-CM | POA: Diagnosis not present

## 2021-10-15 DIAGNOSIS — M3500 Sicca syndrome, unspecified: Secondary | ICD-10-CM | POA: Diagnosis not present

## 2021-10-15 DIAGNOSIS — L89321 Pressure ulcer of left buttock, stage 1: Secondary | ICD-10-CM | POA: Diagnosis not present

## 2021-10-15 DIAGNOSIS — Z7409 Other reduced mobility: Secondary | ICD-10-CM | POA: Diagnosis not present

## 2021-10-15 DIAGNOSIS — D696 Thrombocytopenia, unspecified: Secondary | ICD-10-CM | POA: Diagnosis not present

## 2021-10-15 DIAGNOSIS — S81801A Unspecified open wound, right lower leg, initial encounter: Secondary | ICD-10-CM | POA: Diagnosis not present

## 2021-10-15 DIAGNOSIS — L97922 Non-pressure chronic ulcer of unspecified part of left lower leg with fat layer exposed: Secondary | ICD-10-CM | POA: Diagnosis not present

## 2021-10-15 DIAGNOSIS — S62102A Fracture of unspecified carpal bone, left wrist, initial encounter for closed fracture: Secondary | ICD-10-CM | POA: Diagnosis not present

## 2021-10-15 DIAGNOSIS — D61818 Other pancytopenia: Secondary | ICD-10-CM | POA: Diagnosis not present

## 2021-10-19 ENCOUNTER — Other Ambulatory Visit: Payer: Self-pay | Admitting: Cardiology

## 2021-10-31 DIAGNOSIS — S62025A Nondisplaced fracture of middle third of navicular [scaphoid] bone of left wrist, initial encounter for closed fracture: Secondary | ICD-10-CM | POA: Diagnosis not present

## 2021-11-07 DIAGNOSIS — Z7409 Other reduced mobility: Secondary | ICD-10-CM | POA: Diagnosis not present

## 2021-11-07 DIAGNOSIS — L89321 Pressure ulcer of left buttock, stage 1: Secondary | ICD-10-CM | POA: Diagnosis not present

## 2021-11-07 DIAGNOSIS — S81801A Unspecified open wound, right lower leg, initial encounter: Secondary | ICD-10-CM | POA: Diagnosis not present

## 2021-11-27 DIAGNOSIS — M62830 Muscle spasm of back: Secondary | ICD-10-CM | POA: Diagnosis not present

## 2021-12-01 DIAGNOSIS — M62830 Muscle spasm of back: Secondary | ICD-10-CM | POA: Diagnosis not present

## 2021-12-01 DIAGNOSIS — M546 Pain in thoracic spine: Secondary | ICD-10-CM | POA: Diagnosis not present

## 2021-12-01 DIAGNOSIS — M47894 Other spondylosis, thoracic region: Secondary | ICD-10-CM | POA: Diagnosis not present

## 2021-12-01 DIAGNOSIS — M47814 Spondylosis without myelopathy or radiculopathy, thoracic region: Secondary | ICD-10-CM | POA: Diagnosis not present

## 2021-12-02 DIAGNOSIS — R0989 Other specified symptoms and signs involving the circulatory and respiratory systems: Secondary | ICD-10-CM | POA: Diagnosis not present

## 2021-12-07 ENCOUNTER — Other Ambulatory Visit: Payer: Self-pay | Admitting: Cardiology

## 2021-12-09 NOTE — Telephone Encounter (Signed)
Furosemide 20 mg # 30 only with message patient needs appointment for future refills sent to  [id number] [place], [place] - [address]

## 2021-12-31 ENCOUNTER — Other Ambulatory Visit: Payer: Self-pay | Admitting: Cardiology

## 2022-01-08 ENCOUNTER — Other Ambulatory Visit: Payer: Self-pay | Admitting: Cardiology

## 2022-01-28 ENCOUNTER — Telehealth: Payer: Self-pay | Admitting: Cardiology

## 2022-01-28 MED ORDER — LOVASTATIN 20 MG PO TABS: 20.0000 mg | ORAL_TABLET | Freq: Every day | ORAL | 0 refills | Status: DC

## 2022-01-28 NOTE — Telephone Encounter (Signed)
*  STAT* If patient is at the pharmacy, call can be transferred to refill team.   1. Which medications need to be refilled? (please list name of each medication and dose if known) lovastatin (MEVACOR) 20 MG tablet  2. Which pharmacy/location (including street and city if local pharmacy) is medication to be sent to? [id number] [place], [place] - [address]  3. Do they need a 30 day or 90 day supply? [address]

## 2022-01-28 NOTE — Telephone Encounter (Signed)
Refill for Lovastatin 20 mg # 90 only sent to [facility] [place], [place]. Message to patient to schedule appointment for future refills also sent

## 2022-01-28 NOTE — Telephone Encounter (Signed)
Patient called to say that she is not longer on hospice. Just wanted to let the dr know. Please advise

## 2022-01-29 ENCOUNTER — Other Ambulatory Visit: Payer: Self-pay

## 2022-02-25 ENCOUNTER — Other Ambulatory Visit: Payer: Self-pay | Admitting: Cardiology

## 2022-02-27 ENCOUNTER — Other Ambulatory Visit: Payer: Self-pay | Admitting: Cardiology

## 2022-02-27 ENCOUNTER — Telehealth: Payer: Self-pay | Admitting: *Deleted

## 2022-02-27 MED ORDER — FUROSEMIDE 20 MG PO TABS: 20.0000 mg | ORAL_TABLET | Freq: Every day | ORAL | 0 refills | Status: DC | PRN

## 2022-02-27 NOTE — Telephone Encounter (Signed)
Spoke with pt who is very frustrated because she has had trouble getting through to someone on the phone. Pt is needing refills of her Furosemide 20 mg once daily but was only given 15 due to her need for an office visit. Pt stated she would not be able to come in so I encouraged her to sign up for my chart so that a virtual visit could be done. Sent pt link to her cell phone to get signed up and is going to request virtual visit via [place].  Sent in 3 months worth of Furosemide to pharmacy per Dr. Oren Binet request.

## 2022-03-06 IMAGING — CT CT HEART MORP W/ CTA COR W/ SCORE W/ CA W/CM &/OR W/O CM
2 of 6 series · 12 of 20 positions shown, 14 images · IV contrast (APPLIED)
Comparison: None.
COMPARISON: None.

Addendum:
EXAM:
OVER-READ INTERPRETATION  CT CHEST

The following report is an over-read performed by radiologist Dr.
Riki Render [REDACTED] on 10/19/2019. This
over-read does not include interpretation of cardiac or coronary
anatomy or pathology. The coronary calcium score/coronary CTA
interpretation by the cardiologist is attached.
CLINICAL DATA: Aortic stenosis
Cardiac TAVR CT
TECHNIQUE: The patient was scanned on a Siemens Force [REDACTED]ice scanner. A 120
kV retrospective scan was triggered in the descending thoracic aorta
at 111 HU's. Gantry rotation speed was 270 msecs and collimation was
.9 mm. No beta blockade or nitro were given. The 3D data set was
reconstructed in 5% intervals of the R-R cycle. Systolic and
diastolic phases were analyzed on a dedicated work station using
MPR, MIP and VRT modes. The patient received 80 cc of contrast.

[Series 5: 0-90% · axial · 0.39mm/px · z∈[-0,+153]mm · 6 of 3560 slices shown, 8 images]
[im 509/3560  vessel]
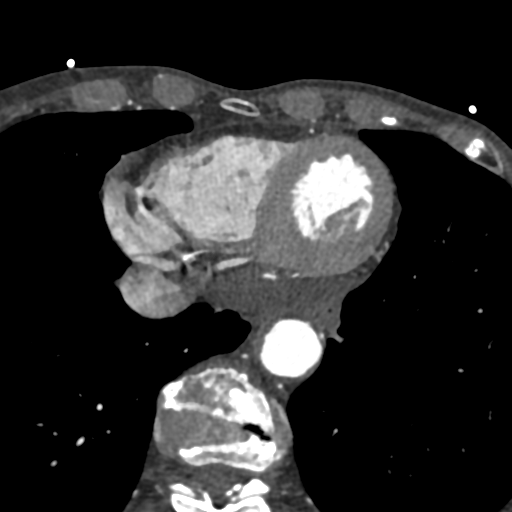
[im 509/3560  lung]
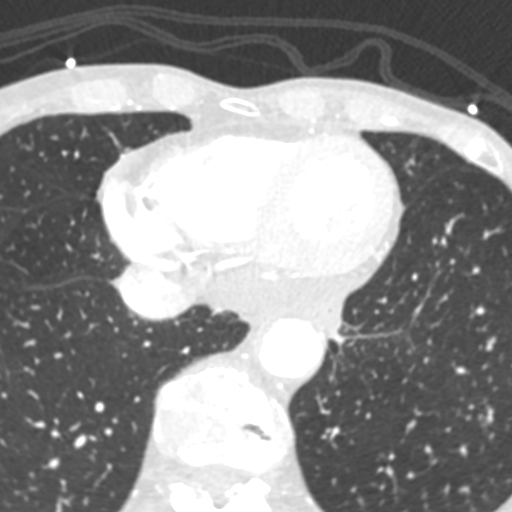
[im 1017/3560  vessel]
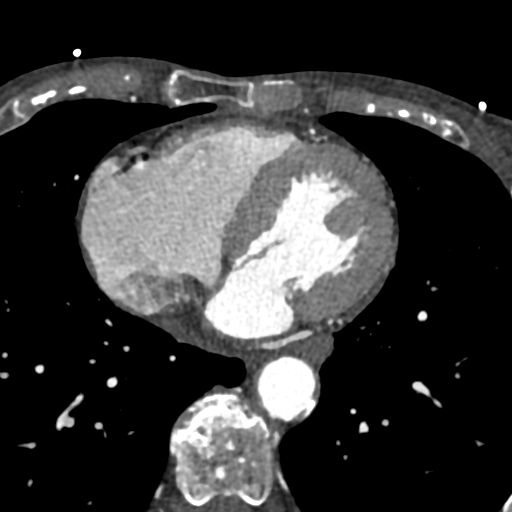
[im 1526/3560  vessel]
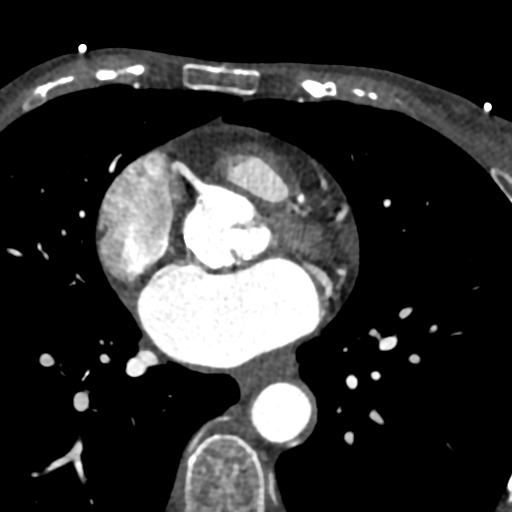
[im 2034/3560  vessel]
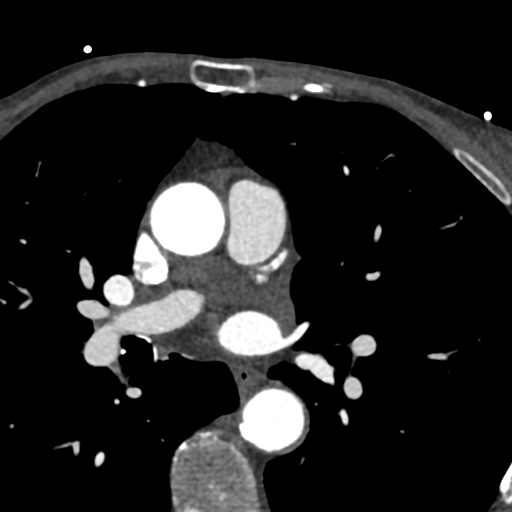
[im 2543/3560  vessel]
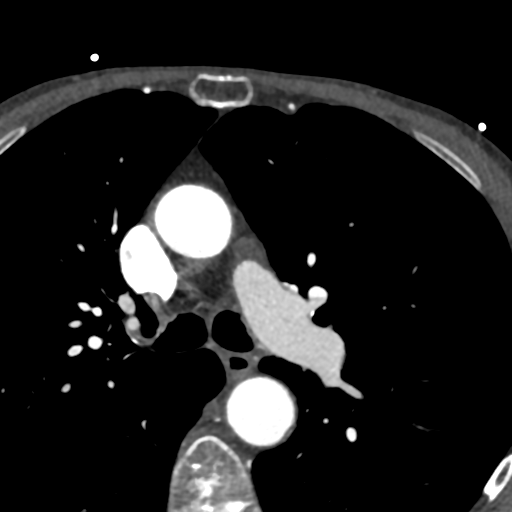
[im 2543/3560  lung]
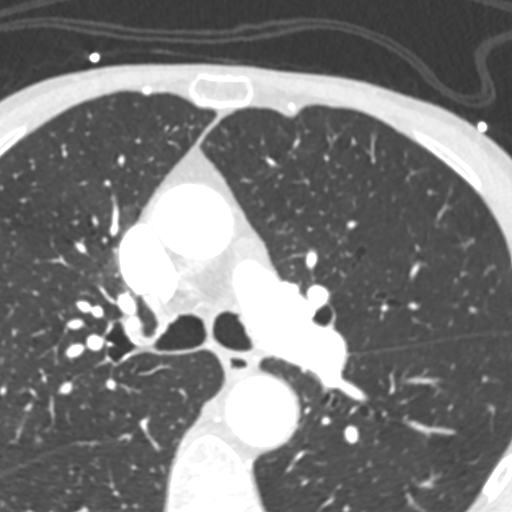
[im 3051/3560  vessel]
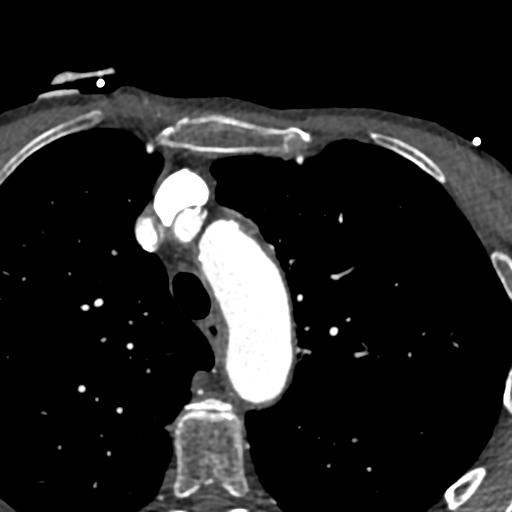

[Series 6: 5-95% · axial · 0.39mm/px · z∈[-0,+153]mm · 6 of 3560 slices shown]
[im 509/3560  vessel]
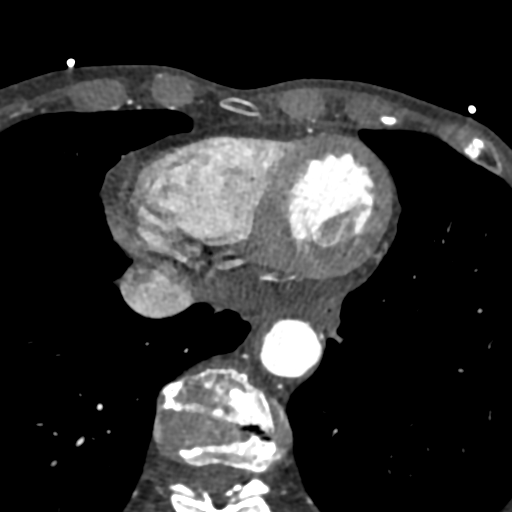
[im 1017/3560  vessel]
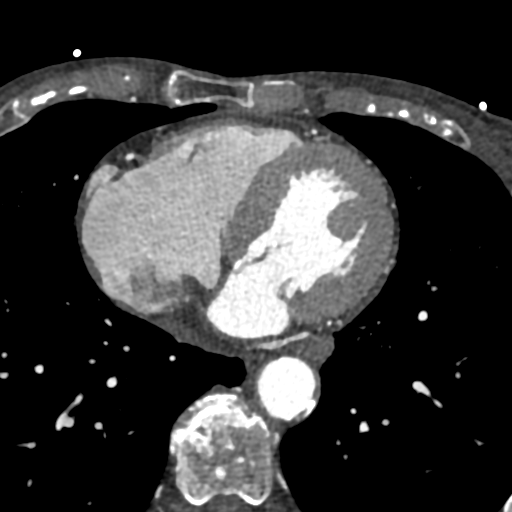
[im 1526/3560  vessel]
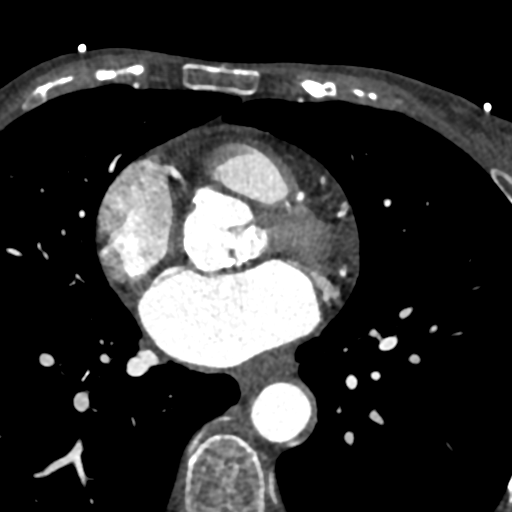
[im 2034/3560  vessel]
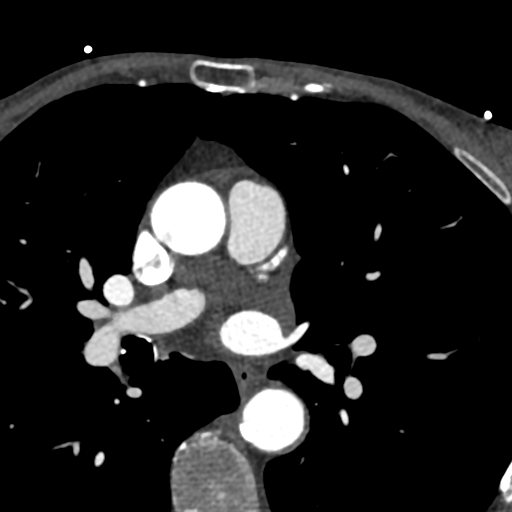
[im 2543/3560  vessel]
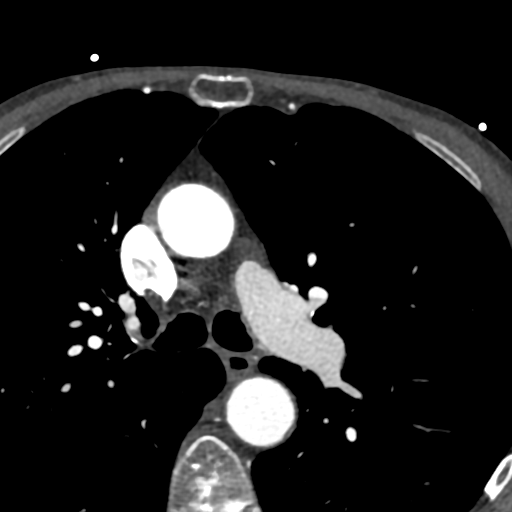
[im 3051/3560  vessel]
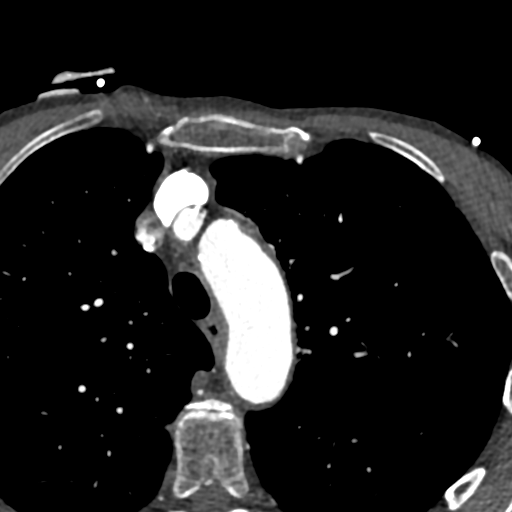

[12 of 20 positions shown; findings below may reference images not displayed]

FINDINGS: Extracardiac findings are described separately under dictation for
contemporaneously obtained CTA of the chest, abdomen and pelvis.
IMPRESSION: Please see separate dictation for contemporaneously obtained CTA
chest, abdomen and pelvis dated 10/19/2019 for full description of
relevant extracardiac findings.
FINDINGS: Aortic Valve: Tri cuspid calcified with restricted leaflet motion

Aorta: Moderate mixed calcific plaque with normal arch vessels

Sinotubular Junction: 24 mm with calcification

Ascending Thoracic Aorta: 29 mm

Aortic Arch: 29 mm

Descending Thoracic Aorta: 20 mm

Sinus of Valsalva Measurements:

Non-coronary: 28.1 mm

Right - coronary: 25.6 mm

Left - coronary: 28.6 mm

Coronary Artery Height above Annulus:

Left Main: 11 mm above annulus

Right Coronary: 14.9 mm above annulus

Virtual Basal Annulus Measurements:

Maximum/Minimum Diameter: 25.9 mm x 21.9 mm

Perimeter: 76 mm

Area: 445 mm 2

Coronary Arteries: Sufficient height above annulus for deployment

Optimum Fluoroscopic Angle for Delivery: LAO 5 Caudal 8 degrees
IMPRESSION: 1. Tri leaflet AV with annular area of 445 mm 2 suitable for a 26 mm
Sapien 3 valve

2.  Coronary arteries suitable height above annulus for deployment

3.  Optimum angiographic angle for deployment LAO 5 Caudal 8 degrees

4.  Normal aortic root 2.9 cm

Rayon Shupe

*** End of Addendum ***
EXAM:
OVER-READ INTERPRETATION  CT CHEST

The following report is an over-read performed by radiologist Dr.
Riki Render [REDACTED] on 10/19/2019. This
over-read does not include interpretation of cardiac or coronary
anatomy or pathology. The coronary calcium score/coronary CTA
interpretation by the cardiologist is attached.
FINDINGS: Extracardiac findings are described separately under dictation for
contemporaneously obtained CTA of the chest, abdomen and pelvis.
IMPRESSION: Please see separate dictation for contemporaneously obtained CTA
chest, abdomen and pelvis dated 10/19/2019 for full description of
relevant extracardiac findings.

## 2022-04-04 ENCOUNTER — Telehealth: Payer: Self-pay | Admitting: Cardiology

## 2022-04-04 ENCOUNTER — Other Ambulatory Visit: Payer: Self-pay | Admitting: Cardiology

## 2022-04-04 ENCOUNTER — Other Ambulatory Visit: Payer: Self-pay

## 2022-04-04 MED ORDER — FUROSEMIDE 20 MG PO TABS: 20.0000 mg | ORAL_TABLET | Freq: Every day | ORAL | 0 refills | Status: DC | PRN

## 2022-04-04 NOTE — Telephone Encounter (Signed)
Called patient and she reported that she was having lower extremity edema in her calves and ankles that comes and goes. She has been taking Lasix PRN when she has the edema but currently it doesn't seem to be working. She is not short of breath and has not been keeping a log of her weights. She would like to know if she can take another Lasix pill to help with the edema, she has already taken one today. Please advise.

## 2022-04-04 NOTE — Telephone Encounter (Signed)
Pt c/o swelling: STAT is pt has developed SOB within 24 hours  How much weight have you gained and in what time span? Doesn't think she's gained weight.   If swelling, where is the swelling located? Was just in calves, but now also in ankles  Are you currently taking a fluid pill? Yes.   Are you currently SOB? No   Do you have a log of your daily weights (if so, list)? No  Have you gained 3 pounds in a day or 5 pounds in a week? No  Have you traveled recently? No   Pt states that she is experiencing more swelling here recently and would like to know if she can increase her fluid pill.

## 2022-04-04 NOTE — Telephone Encounter (Signed)
Called the patient and informed her of Dr. Joya Gaskins response to her request to take an extra Lasix pill because of her edema:  "Yes"  Patient was agreeable with this plan and had no further questions at this time.

## 2022-04-14 ENCOUNTER — Telehealth: Payer: Self-pay | Admitting: Cardiology

## 2022-04-14 NOTE — Telephone Encounter (Signed)
Pt c/o of Chest Pain: STAT if CP now or developed within 24 hours  1. Are you having CP right now?  No, patient denies ever having chest pain Patient describes the sensation as chest pressure, but not currently having symptoms.  2. Are you experiencing any other symptoms (ex. SOB, nausea, vomiting, sweating)?  Soreness in arms/spine, inability to walk--legs wouldn't hold her up   3. How long have you been experiencing CP?  Chest pressure occurred once on Saturday, 12/16, AM  4. Is your CP continuous or coming and going?  One occurrence   5. Have you taken Nitroglycerin?   No ?

## 2022-04-14 NOTE — Telephone Encounter (Signed)
Spoke with pt regarding message left earlier. She stated that she had one episode of chest pressure not associated with any shortness of breath. It subsided, she said she felt "heavy". She was weak and had trouble walking. This has since subsided. She said she is not able get out of her house to see [place] or PCP. She has a CNA caregiver. She reported she has a mechanical valve and is on an '81mg'$  Aspirin.  BP: Sat-118/60 Sun-100/60.

## 2022-04-14 NOTE — Telephone Encounter (Signed)
Hello! Pt is returning call. Transferred to Lattie Haw, Therapist, sports.

## 2022-04-14 NOTE — Telephone Encounter (Signed)
Spoke with pt and relayed Dr. Joya Gaskins recommendations. Pt verbalized understanding and had no further questions.

## 2022-04-30 ENCOUNTER — Other Ambulatory Visit: Payer: Self-pay

## 2022-04-30 ENCOUNTER — Telehealth: Payer: Self-pay | Admitting: Cardiology

## 2022-04-30 NOTE — Telephone Encounter (Signed)
Called patient and she reported that she had developed bruising on her abdomen and her arms. She is currently on Aspirin 81 mg daily. I spoke to Dr. 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 Patient stated that she would do that and had no further questions at this time.

## 2022-04-30 NOTE — Telephone Encounter (Signed)
Pt c/o medication issue:  1. Name of Medication: aspirin 81 MG chewable tablet   2. How are you currently taking this medication (dosage and times per day)? 1 tablet daily  3. Are you having a reaction (difficulty breathing--STAT)? no  4. What is your medication issue? Patient states she has bruises all over her body and is not sure if she should continue taking the aspirin.

## 2022-05-27 ENCOUNTER — Encounter (HOSPITAL_COMMUNITY): Payer: Self-pay

## 2022-05-27 ENCOUNTER — Other Ambulatory Visit: Payer: Self-pay

## 2022-05-27 ENCOUNTER — Emergency Department (HOSPITAL_COMMUNITY): Payer: Medicare HMO

## 2022-05-27 ENCOUNTER — Inpatient Hospital Stay (HOSPITAL_COMMUNITY)
Admission: EM | Admit: 2022-05-27 | Discharge: 2022-05-29 | DRG: 682 | Disposition: A | Discharge status: Home Health | Payer: Medicare HMO | Attending: Internal Medicine | Admitting: Internal Medicine

## 2022-05-27 DIAGNOSIS — I700 Atherosclerosis of aorta: Secondary | ICD-10-CM | POA: Diagnosis present

## 2022-05-27 DIAGNOSIS — Z681 Body mass index (BMI) 19 or less, adult: Secondary | ICD-10-CM | POA: Diagnosis not present

## 2022-05-27 DIAGNOSIS — Z7952 Long term (current) use of systemic steroids: Secondary | ICD-10-CM

## 2022-05-27 DIAGNOSIS — E039 Hypothyroidism, unspecified: Secondary | ICD-10-CM | POA: Diagnosis present

## 2022-05-27 DIAGNOSIS — I4891 Unspecified atrial fibrillation: Secondary | ICD-10-CM | POA: Diagnosis not present

## 2022-05-27 DIAGNOSIS — K219 Gastro-esophageal reflux disease without esophagitis: Secondary | ICD-10-CM | POA: Diagnosis present

## 2022-05-27 DIAGNOSIS — Z7982 Long term (current) use of aspirin: Secondary | ICD-10-CM | POA: Diagnosis not present

## 2022-05-27 DIAGNOSIS — I10 Essential (primary) hypertension: Secondary | ICD-10-CM | POA: Diagnosis present

## 2022-05-27 DIAGNOSIS — N1832 Chronic kidney disease, stage 3b: Secondary | ICD-10-CM | POA: Diagnosis present

## 2022-05-27 DIAGNOSIS — I129 Hypertensive chronic kidney disease with stage 1 through stage 4 chronic kidney disease, or unspecified chronic kidney disease: Secondary | ICD-10-CM | POA: Diagnosis present

## 2022-05-27 DIAGNOSIS — E860 Dehydration: Secondary | ICD-10-CM | POA: Diagnosis present

## 2022-05-27 DIAGNOSIS — Z79890 Hormone replacement therapy (postmenopausal): Secondary | ICD-10-CM | POA: Diagnosis not present

## 2022-05-27 DIAGNOSIS — D696 Thrombocytopenia, unspecified: Secondary | ICD-10-CM | POA: Diagnosis present

## 2022-05-27 DIAGNOSIS — M3500 Sicca syndrome, unspecified: Secondary | ICD-10-CM | POA: Diagnosis present

## 2022-05-27 DIAGNOSIS — E43 Unspecified severe protein-calorie malnutrition: Secondary | ICD-10-CM | POA: Diagnosis present

## 2022-05-27 DIAGNOSIS — Z8572 Personal history of non-Hodgkin lymphomas: Secondary | ICD-10-CM | POA: Diagnosis not present

## 2022-05-27 DIAGNOSIS — N390 Urinary tract infection, site not specified: Secondary | ICD-10-CM

## 2022-05-27 DIAGNOSIS — E785 Hyperlipidemia, unspecified: Secondary | ICD-10-CM | POA: Diagnosis present

## 2022-05-27 DIAGNOSIS — Z90710 Acquired absence of both cervix and uterus: Secondary | ICD-10-CM | POA: Diagnosis not present

## 2022-05-27 DIAGNOSIS — Z1152 Encounter for screening for COVID-19: Secondary | ICD-10-CM | POA: Diagnosis not present

## 2022-05-27 DIAGNOSIS — N3000 Acute cystitis without hematuria: Secondary | ICD-10-CM | POA: Diagnosis present

## 2022-05-27 DIAGNOSIS — Z79899 Other long term (current) drug therapy: Secondary | ICD-10-CM | POA: Diagnosis not present

## 2022-05-27 DIAGNOSIS — E872 Acidosis, unspecified: Secondary | ICD-10-CM | POA: Diagnosis present

## 2022-05-27 DIAGNOSIS — Z8249 Family history of ischemic heart disease and other diseases of the circulatory system: Secondary | ICD-10-CM

## 2022-05-27 DIAGNOSIS — Z952 Presence of prosthetic heart valve: Secondary | ICD-10-CM

## 2022-05-27 DIAGNOSIS — I714 Abdominal aortic aneurysm, without rupture, unspecified: Secondary | ICD-10-CM | POA: Diagnosis not present

## 2022-05-27 DIAGNOSIS — D6959 Other secondary thrombocytopenia: Secondary | ICD-10-CM | POA: Diagnosis present

## 2022-05-27 DIAGNOSIS — D649 Anemia, unspecified: Secondary | ICD-10-CM | POA: Diagnosis present

## 2022-05-27 DIAGNOSIS — N179 Acute kidney failure, unspecified: Secondary | ICD-10-CM | POA: Diagnosis not present

## 2022-05-27 DIAGNOSIS — I480 Paroxysmal atrial fibrillation: Secondary | ICD-10-CM | POA: Diagnosis present

## 2022-05-27 LAB — URINALYSIS, W/ REFLEX TO CULTURE (INFECTION SUSPECTED)
Bilirubin Urine: NEGATIVE
Glucose, UA: NEGATIVE mg/dL
Hgb urine dipstick: NEGATIVE
Ketones, ur: 5 mg/dL — AB
Nitrite: NEGATIVE
Protein, ur: 30 mg/dL — AB
Specific Gravity, Urine: 1.019 (ref 1.005–1.030)
pH: 5 (ref 5.0–8.0)

## 2022-05-27 LAB — BASIC METABOLIC PANEL
Anion gap: 14 (ref 5–15)
BUN: 42 mg/dL — ABNORMAL HIGH (ref 8–23)
CO2: 19 mmol/L — ABNORMAL LOW (ref 22–32)
Calcium: 8.7 mg/dL — ABNORMAL LOW (ref 8.9–10.3)
Chloride: 102 mmol/L (ref 98–111)
Creatinine, Ser: 2.72 mg/dL — ABNORMAL HIGH (ref 0.44–1.00)
GFR, Estimated: 16 mL/min — ABNORMAL LOW (ref >60)
Glucose, Bld: 98 mg/dL (ref 70–99)
Potassium: 4.8 mmol/L (ref 3.5–5.1)
Sodium: 135 mmol/L (ref 135–145)

## 2022-05-27 LAB — CBC
HCT: 31.5 % — ABNORMAL LOW (ref 36.0–46.0)
Hemoglobin: 10 g/dL — ABNORMAL LOW (ref 12.0–15.0)
MCH: 28.4 pg (ref 26.0–34.0)
MCHC: 31.7 g/dL (ref 30.0–36.0)
MCV: 89.5 fL (ref 80.0–100.0)
Platelets: 168 10*3/uL (ref 150–400)
RBC: 3.52 MIL/uL — ABNORMAL LOW (ref 3.87–5.11)
RDW: 14.2 % (ref 11.5–15.5)
WBC: 10.7 10*3/uL — ABNORMAL HIGH (ref 4.0–10.5)
nRBC: 0 % (ref 0.0–0.2)

## 2022-05-27 LAB — URINALYSIS, ROUTINE W REFLEX MICROSCOPIC
Bilirubin Urine: NEGATIVE
Glucose, UA: NEGATIVE mg/dL
Hgb urine dipstick: NEGATIVE
Ketones, ur: 5 mg/dL — AB
Nitrite: NEGATIVE
Protein, ur: 30 mg/dL — AB
Specific Gravity, Urine: 1.02 (ref 1.005–1.030)
pH: 5 (ref 5.0–8.0)

## 2022-05-27 LAB — RESP PANEL BY RT-PCR (RSV, FLU A&B, COVID)  RVPGX2
Influenza A by PCR: NEGATIVE
Influenza B by PCR: NEGATIVE
Resp Syncytial Virus by PCR: NEGATIVE
SARS Coronavirus 2 by RT PCR: NEGATIVE

## 2022-05-27 LAB — CBG MONITORING, ED: Glucose-Capillary: 86 mg/dL (ref 70–99)

## 2022-05-27 MED ORDER — ACETAMINOPHEN 325 MG PO TABS: 650.0000 mg | ORAL_TABLET | Freq: Four times a day (QID) | ORAL | Status: DC | PRN

## 2022-05-27 MED ORDER — LEVOTHYROXINE SODIUM 50 MCG PO TABS
50.0000 ug | ORAL_TABLET | Freq: Every day | ORAL | Status: DC
  Administered 2022-05-28 – 2022-05-29 (×2): 50 ug via ORAL
  Filled 2022-05-27 (×2): qty 1

## 2022-05-27 MED ORDER — SODIUM CHLORIDE 0.9 % IV SOLN
1.0000 g | Freq: Once | INTRAVENOUS | Status: AC
  Administered 2022-05-27: 1 g via INTRAVENOUS
  Filled 2022-05-27: qty 10

## 2022-05-27 MED ORDER — SODIUM CHLORIDE 0.9 % IV SOLN
2.0000 g | INTRAVENOUS | Status: DC
  Administered 2022-05-28: 2 g via INTRAVENOUS
  Filled 2022-05-27: qty 20

## 2022-05-27 MED ORDER — SODIUM CHLORIDE 0.9 % IV SOLN: 2.0000 g | INTRAVENOUS | Status: DC

## 2022-05-27 MED ORDER — SODIUM CHLORIDE 0.9 % IV BOLUS
1000.0000 mL | Freq: Once | INTRAVENOUS | Status: AC
  Administered 2022-05-27: 1000 mL via INTRAVENOUS

## 2022-05-27 MED ORDER — SODIUM CHLORIDE 0.9 % IV SOLN: INTRAVENOUS | Status: DC

## 2022-05-27 MED ORDER — PROCHLORPERAZINE EDISYLATE 10 MG/2ML IJ SOLN
5.0000 mg | Freq: Four times a day (QID) | INTRAMUSCULAR | Status: DC | PRN
  Filled 2022-05-27: qty 2

## 2022-05-27 MED ORDER — PRAVASTATIN SODIUM 40 MG PO TABS
40.0000 mg | ORAL_TABLET | Freq: Every day | ORAL | Status: DC
  Administered 2022-05-28: 40 mg via ORAL
  Filled 2022-05-27: qty 1

## 2022-05-27 MED ORDER — ENOXAPARIN SODIUM 30 MG/0.3ML IJ SOSY
30.0000 mg | PREFILLED_SYRINGE | INTRAMUSCULAR | Status: DC
  Administered 2022-05-28 (×2): 30 mg via SUBCUTANEOUS
  Filled 2022-05-27 (×2): qty 0.3

## 2022-05-27 MED ORDER — MELATONIN 3 MG PO TABS
3.0000 mg | ORAL_TABLET | Freq: Every evening | ORAL | Status: DC | PRN
  Administered 2022-05-28: 3 mg via ORAL
  Filled 2022-05-27: qty 1

## 2022-05-27 NOTE — ED Notes (Signed)
Pt given 6 saltine cracker packs, 2 graham crackers and 2 apple juice cups

## 2022-05-27 NOTE — ED Triage Notes (Signed)
Pt [facility] from home c/o feeling weak and dizzy since starting amoxicillin for a kidney infection 3 days ago. Per [place] initially her HR was 160 but by time they got her in the truck she had converted. Pt also initially was hypotensive in the 90's but now is in the 844'B systolic.

## 2022-05-27 NOTE — ED Provider Triage Note (Signed)
Emergency Medicine Provider Triage Evaluation Note  Carmen Blackwell , a 89 y.o. female  was evaluated in triage.  Pt complains of dizziness for the last 3 days.  She has taken 3 days of amoxicillin to treat a "kidney infection" that was diagnosed by her PCP.  States that she is unsure that a kidney infection is what is really causing her symptoms.  States that dizziness is like a spinning sensation that comes and goes and is associated with weakness all over.  She states that "all my symptoms are side effects of the amoxicillin."  She denies chest pain, shortness of breath, abdominal pain, focal weakness, headache, LOC, syncope, or vision changes. Pt denies history of recurrent UTIs.   Review of Systems  Positive: See HPI Negative: See HPI  Physical Exam  BP 111/71 (BP Location: Right Arm)   Pulse 67   Temp 98.2 F (36.8 C)   Resp 18   Ht '5\' 7"'$  (1.702 m)   Wt 45.4 kg   SpO2 94%   BMI 15.66 kg/m  Gen:   Awake, no distress   Resp:  Normal effort LCTA MSK:   Moves extremities without difficulty  Other:  5/5 strength bilateral UE and LE, alert and oriented, soft and nontender abdomen, easily agitated with interview and making multiple requests instead of answering questions  Medical Decision Making  Medically screening exam initiated at 2:35 PM.  Appropriate orders placed.  Carmen Blackwell was informed that the remainder of the evaluation will be completed by another provider, this initial triage assessment does not replace that evaluation, and the importance of remaining in the ED until their evaluation is complete.     Suzzette Righter, PA-C 05/27/22 1438

## 2022-05-27 NOTE — ED Provider Notes (Signed)
[facility] Provider Note   CSN: [id number] Arrival date & time: 05/27/22  1411     History  Chief Complaint  Patient presents with   Dizziness   Weakness    Carmen Blackwell is a 89 y.o. female history of lymphoma, hyperlipidemia, hypothyroidism, here presenting with dizziness and weakness.  Patient was recently diagnosed with UTI.  She was initially put on Cipro and then urine culture showed Klebsiella.  She was switched to amoxicillin 2 days ago.  Patient states that since taking amoxicillin she has been lightheaded and dizzy.  She had a fever 2 days ago that resolved.  Patient states that she has a hard time walking due to the dizziness.  Also has flank pain as well.  The history is provided by the patient.       Home Medications Prior to Admission medications   Medication Sig Start Date End Date Taking? Authorizing Provider  antiseptic oral rinse (BIOTENE) LIQD 15 mLs by Mouth Rinse route as needed for dry mouth. Mouth spray    [provider]  aspirin 81 MG chewable tablet Chew 1 tablet (81 mg total) by mouth daily. 10/26/19   Eileen Stanford, PA-C  Calcium Carb-Cholecalciferol (CALCIUM 600+D3 PO) Take 1 capsule by mouth daily.    [provider]  furosemide (LASIX) 20 MG tablet Take 1 tablet (20 mg total) by mouth daily as needed for fluid or edema. Pt is to set up Appt thru MyChart for Virtual Visit 04/04/22   Richardo Priest, MD  gabapentin (NEURONTIN) 100 MG capsule Take 100 mg by mouth daily as needed. Patient not taking: Reported on 11/30/2020    [provider]  hydroxychloroquine (PLAQUENIL) 200 MG tablet Take 200 mg by mouth daily.  03/09/17   [provider]  levothyroxine (SYNTHROID, LEVOTHROID) 50 MCG tablet Take 50 mcg by mouth daily before breakfast.  09/08/16   [provider]  lovastatin (MEVACOR) 20 MG tablet Take 1 tablet (20 mg total) by mouth daily. Needs  appointment for future refills / 1st attempt 01/28/22   Richardo Priest, MD  Magnesium Gluconate 250 MG TABS Take 250 mg by mouth daily.     [provider]  olopatadine (PATANOL) 0.1 % ophthalmic solution Place 1 drop into both eyes 2 (two) times daily.    [provider]  potassium gluconate 595 (99 K) MG TABS tablet Take 595 mg by mouth daily. TAKES 1 TABLET DAILY    [provider]  predniSONE (DELTASONE) 5 MG tablet Take 5 mg by mouth daily.  03/09/17   [provider]  traMADol (ULTRAM) 50 MG tablet Take 50 mg by mouth at bedtime.  10/08/16   [provider]  Vaginal Lubricant (REPLENS) GEL Place 1 application vaginally See admin instructions. Every 3 days    [provider]      Allergies    Nsaids, Oxybutynin, Requip [ropinirole hcl], and Ropinirole    Review of Systems   Review of Systems  Neurological:  Positive for dizziness and weakness.  All other systems reviewed and are negative.   Physical Exam Updated Vital Signs BP 120/63   Pulse 85   Temp 98.2 F (36.8 C) (Oral)   Resp 16   Ht '5\' 7"'$  (1.702 m)   Wt 45.4 kg   SpO2 92%   BMI 15.66 kg/m  Physical Exam Vitals and nursing note reviewed.  Constitutional:      Comments: Chronically  ill and dehydrated  HENT:     Head: Normocephalic.     Nose: Nose normal.     Mouth/Throat:     Mouth: Mucous membranes are dry.  Eyes:     Extraocular Movements: Extraocular movements intact.     Pupils: Pupils are equal, round, and reactive to light.  Cardiovascular:     Rate and Rhythm: Normal rate and regular rhythm.     Pulses: Normal pulses.     Heart sounds: Normal heart sounds.  Pulmonary:     Effort: Pulmonary effort is normal.     Breath sounds: Normal breath sounds.  Abdominal:     General: Abdomen is flat.     Palpations: Abdomen is soft.     Comments: Mild bilateral CVA tenderness  Musculoskeletal:        General: Normal range of motion.     Cervical back:  Normal range of motion and neck supple.  Skin:    General: Skin is warm.     Capillary Refill: Capillary refill takes less than 2 seconds.  Neurological:     General: No focal deficit present.     Mental Status: She is alert and oriented to person, place, and time.  Psychiatric:        Mood and Affect: Mood normal.        Behavior: Behavior normal.     ED Results / Procedures / Treatments   Labs (all labs ordered are listed, but only abnormal results are displayed) Labs Reviewed  BASIC METABOLIC PANEL - Abnormal; Notable for the following components:      Result Value   CO2 19 (*)    BUN 42 (*)    Creatinine, Ser 2.72 (*)    Calcium 8.7 (*)    GFR, Estimated 16 (*)    All other components within normal limits  CBC - Abnormal; Notable for the following components:   WBC 10.7 (*)    RBC 3.52 (*)    Hemoglobin 10.0 (*)    HCT 31.5 (*)    All other components within normal limits  URINALYSIS, ROUTINE W REFLEX MICROSCOPIC - Abnormal; Notable for the following components:   APPearance CLOUDY (*)    Ketones, ur 5 (*)    Protein, ur 30 (*)    Leukocytes,Ua TRACE (*)    Bacteria, UA RARE (*)    All other components within normal limits  RESP PANEL BY RT-PCR (RSV, FLU A&B, COVID)  [id number]  URINALYSIS, W/ REFLEX TO CULTURE (INFECTION SUSPECTED)  CBG MONITORING, ED    EKG None  Radiology No results found.  Procedures Procedures    Medications Ordered in ED Medications  cefTRIAXone (ROCEPHIN) 1 g in sodium chloride 0.9 % 100 mL IVPB (1 g Intravenous New Bag/Given 05/27/22 1847)  sodium chloride 0.9 % bolus 1,000 mL (1,000 mLs Intravenous New Bag/Given 05/27/22 1847)    ED Course/ Medical Decision Making/ A&P                             Medical Decision Making Carmen Blackwell is a 89 y.o. female who presented with dizziness and flank pain.  Patient was recently diagnosed with UTI.  Patient was on Cipro and now on amoxicillin.  She thought she had a side effect of  amoxicillin.  Consider dehydration versus renal failure versus sepsis from UTI.  Plan to get CBC and CMP and UA.  7:14 PM Patient has acute renal  failure with creatinine of 2.7.  White blood cell count is 10.  UA showed some bacteria and leukocytes.  Previous urine culture was sensitive to cephalosporins.  I ordered IV fluids and Rocephin.  I also ordered CT renal stone.   Problems Addressed: AKI (acute kidney injury) [facility]): acute illness or injury Urinary tract infection without hematuria, site unspecified: acute illness or injury  Amount and/or Complexity of Data Reviewed Labs: ordered. Decision-making details documented in ED Course. Radiology: ordered and independent interpretation performed. Decision-making details documented in ED Course.  Risk Prescription drug management. Decision regarding hospitalization.    Final Clinical Impression(s) / ED Diagnoses Final diagnoses:  None    Rx / DC Orders ED Discharge Orders     None         Drenda Freeze, MD 05/27/22 2025

## 2022-05-27 NOTE — ED Notes (Signed)
ED TO INPATIENT HANDOFF REPORT  ED Nurse Name and Phone #: Jacqulyn Bath [phone]  S Name/Age/Gender Carmen Blackwell 89 y.o. female Room/Bed: 043C/043C  Code Status   Code Status: Full Code  Home/SNF/Other Home Patient oriented to: self, place, time, and situation Is this baseline? Yes   Triage Complete: Triage complete  Chief Complaint AKI (acute kidney injury) [facility]) [N17.9]  Triage Note Pt BIB [facility] from home c/o feeling weak and dizzy since starting amoxicillin for a kidney infection 3 days ago. Per [place] initially her HR was 160 but by time they got her in the truck she had converted. Pt also initially was hypotensive in the 90's but now is in the 366'Y systolic.        Allergies Allergies  Allergen Reactions   Nsaids     unkn   Oxybutynin Other (See Comments)    dizziness dizziness    Requip [Ropinirole Hcl]    Ropinirole Other (See Comments)    Other reaction(s): Hypotension (ALLERGY/intolerance) Other reaction(s): Hypotension (ALLERGY/intolerance)     Level of Care/Admitting Diagnosis ED Disposition     ED Disposition  Admit   Condition  --   [facility]: [place] [[id number]]  Level of Care: Telemetry Medical [[id number]]  May admit patient to [facility] or [facility] if equivalent level of care is available:: Yes  Covid Evaluation: Asymptomatic - no recent exposure (last 10 days) testing not required  Diagnosis: AKI (acute kidney injury) [facility]) [[id number]]  Admitting Physician: Kayleen Memos [[id number]]  Attending Physician: Kayleen Memos [[id number]]  Certification:: I certify this patient will need inpatient services for at least 2 midnights  Estimated Length of Stay: 2          B Medical/Surgery History Past Medical History:  Diagnosis Date   Acquired hypothyroidism 04/09/2015   Aortic stenosis    Atrophic vaginitis 12/30/2017   Formatting of this note might be different from the original. 2019: ERT  vaginally   Bacterial food poisoning 07/20/2019   Formatting of this note might be different from the original. 2021   Benign positional vertigo 04/12/2019   Formatting of this note might be different from the original. 2916, 2020   Chronic kidney disease, stage III (moderate) ([place]) 04/09/2015   Chronic right-sided low back pain without sciatica 08/07/2017   Cough 08/28/2015   Essential hypertension 08/28/2015   GERD (gastroesophageal reflux disease) 04/09/2015   HLD (hyperlipidemia)    Lumbosacral radiculopathy at L5 06/18/2018   Formatting of this note might be different from the original. 2020   MALT lymphoma ([place]) 10/10/2015   Osteopenia 06/02/2017   Pancytopenia ([place]) 10/10/2015   Primary osteoarthritis involving multiple joints 05/07/2018   Raynaud's disease without gangrene 07/20/2019   Formatting of this note might be different from the original. 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 2019: chronic   Urge incontinence 05/06/2016   Past Surgical History:  Procedure Laterality Date   ABDOMINAL HYSTERECTOMY     APPENDECTOMY     LUMBAR DISC SURGERY     RIGHT/LEFT HEART CATH AND CORONARY ANGIOGRAPHY N/A 10/12/2019  Procedure: RIGHT/LEFT HEART CATH AND CORONARY ANGIOGRAPHY;  Surgeon: Jettie Booze, MD;  Location: [place] [facility];  Service: Cardiovascular;  Laterality: N/A;   TEE WITHOUT CARDIOVERSION N/A 10/25/2019   Procedure: TRANSESOPHAGEAL ECHOCARDIOGRAM (TEE);  Surgeon: Burnell Blanks, MD;  Location: [place];  Service: Open Heart Surgery;  Laterality: N/A;   TONSILLECTOMY     TRANSCATHETER AORTIC VALVE REPLACEMENT, TRANSFEMORAL N/A 10/25/2019   Procedure:  TRANSCATHETER AORTIC VALVE REPLACEMENT, TRANSFEMORAL;  Surgeon: Burnell Blanks, MD;  Location: [place];  Service: Open Heart Surgery;  Laterality: N/A;     A IV Location/Drains/Wounds Patient Lines/Drains/Airways Status     Active Line/Drains/Airways     Name Placement date Placement time Site Days   Peripheral IV 10/25/19 Anterior;Left Forearm 10/25/19  0920  Forearm  945   Peripheral IV 10/25/19 Anterior;Right Forearm 10/25/19  0920  Forearm  945   Peripheral IV 05/27/22 20 G Left;Posterior Forearm 05/27/22  1419  Forearm  less than 1   External Urinary Catheter 05/27/22  2130  --  less than 1   Incision (Closed) 10/25/19 Groin Right 10/25/19  1149  -- 945   Incision (Closed) 10/25/19 Groin Left 10/25/19  1149  -- 945            Intake/Output Last 24 hours No intake or output data in the 24 hours ending 05/27/22 2347  Labs/Imaging Results for orders placed or performed during the hospital encounter of 05/27/22 (from the past 48 hour(s))  Basic metabolic panel     Status: Abnormal   Collection Time: 05/27/22  2:45 PM  Result Value Ref Range   Sodium 135 135 - 145 mmol/L   Potassium 4.8 3.5 - 5.1 mmol/L   Chloride 102 98 - 111 mmol/L   CO2 19 (L) 22 - 32 mmol/L   Glucose, Bld 98 70 - 99 mg/dL    Comment: Glucose reference range applies only to samples taken after fasting for at least 8 hours.   BUN 42 (H) 8 - 23 mg/dL   Creatinine, Ser 2.72 (H) 0.44 - 1.00 mg/dL   Calcium 8.7 (L) 8.9 - 10.3 mg/dL   GFR, Estimated 16 (L) >60 mL/min    Comment: (NOTE) Calculated using the CKD-EPI Creatinine Equation (2021)    Anion gap 14 5 - 15    Comment: Performed at [facility] [address] [place], [place] [address]  CBC     Status: Abnormal   Collection Time: 05/27/22  2:45 PM  Result Value Ref Range   WBC 10.7 (H) 4.0 - 10.5 K/uL   RBC 3.52 (L) 3.87 - 5.11 MIL/uL   Hemoglobin 10.0 (L) 12.0 - 15.0 g/dL   HCT 31.5 (L) 36.0 - 46.0 %   MCV 89.5 80.0 - 100.0 fL    MCH 28.4 26.0 - 34.0 pg   MCHC 31.7 30.0 - 36.0 g/dL   RDW 14.2 11.5 - 15.5 %   Platelets 168 150 - 400 K/uL   nRBC 0.0 0.0 - 0.2 %    Comment: Performed at [facility], [place] [address] [place], [place] [address]  Urinalysis, Routine w reflex microscopic -Urine, Clean Catch     Status: Abnormal   Collection Time: 05/27/22  5:04 PM  Result Value Ref Range   Color, Urine YELLOW YELLOW   APPearance CLOUDY (A) CLEAR   Specific Gravity, Urine 1.020 1.005 - 1.030   pH 5.0 5.0 - 8.0   Glucose, UA NEGATIVE NEGATIVE mg/dL  Hgb urine dipstick NEGATIVE NEGATIVE   Bilirubin Urine NEGATIVE NEGATIVE   Ketones, ur 5 (A) NEGATIVE mg/dL   Protein, ur 30 (A) NEGATIVE mg/dL   Nitrite NEGATIVE NEGATIVE   Leukocytes,Ua TRACE (A) NEGATIVE   RBC / HPF 0-5 0 - 5 RBC/hpf   WBC, UA 11-20 0 - 5 WBC/hpf   Bacteria, UA RARE (A) NONE SEEN   Squamous Epithelial / HPF 11-20 0 - 5 /HPF   Mucus PRESENT    Hyaline Casts, UA PRESENT    Granular Casts, UA PRESENT     Comment: Performed at [facility] [address] [place], [place] [address]  CBG monitoring, ED     Status: None   Collection Time: 05/27/22  5:46 PM  Result Value Ref Range   Glucose-Capillary 86 70 - 99 mg/dL    Comment: Glucose reference range applies only to samples taken after fasting for at least 8 hours.  Urinalysis, w/ Reflex to Culture (Infection Suspected) -Urine, Clean Catch     Status: Abnormal   Collection Time: 05/27/22  6:14 PM  Result Value Ref Range   Specimen Source URINE, CLEAN CATCH    Color, Urine YELLOW YELLOW   APPearance HAZY (A) CLEAR   Specific Gravity, Urine 1.019 1.005 - 1.030   pH 5.0 5.0 - 8.0   Glucose, UA NEGATIVE NEGATIVE mg/dL   Hgb urine dipstick NEGATIVE NEGATIVE   Bilirubin Urine NEGATIVE NEGATIVE   Ketones, ur 5 (A) NEGATIVE mg/dL   Protein, ur 30 (A) NEGATIVE mg/dL   Nitrite NEGATIVE NEGATIVE   Leukocytes,Ua SMALL (A) NEGATIVE   RBC / HPF 0-5 0 - 5 RBC/hpf   WBC, UA 11-20 0 - 5  WBC/hpf    Comment:        Reflex urine culture not performed if WBC <=10, OR if Squamous epithelial cells >5. If Squamous epithelial cells >5 suggest recollection.    Bacteria, UA RARE (A) NONE SEEN   Squamous Epithelial / HPF 6-10 0 - 5 /HPF    Comment: Performed at [facility], [place] [address] [place], [place] [address]  Resp panel by RT-PCR (RSV, Flu A&B, Covid) Anterior Nasal Swab     Status: None   Collection Time: 05/27/22  6:26 PM   Specimen: Anterior Nasal Swab  Result Value Ref Range   SARS Coronavirus 2 by RT PCR NEGATIVE NEGATIVE    Comment: (NOTE) SARS-CoV-2 target nucleic acids are NOT DETECTED.  The SARS-CoV-2 RNA is generally detectable in upper respiratory specimens during the acute phase of infection. The lowest concentration of SARS-CoV-2 viral copies this assay can detect is 138 copies/mL. A negative result does not preclude SARS-Cov-2 infection and should not be used as the sole basis for treatment or other patient management decisions. A negative result may occur with  improper specimen collection/handling, submission of specimen other than nasopharyngeal swab, presence of viral mutation(s) within the areas targeted by this assay, and inadequate number of viral copies(<138 copies/mL). A negative result must be combined with clinical observations, patient history, and epidemiological information. The expected result is Negative.  Fact Sheet for Patients:  EntrepreneurPulse.com.au  Fact Sheet for Healthcare Providers:  IncredibleEmployment.be  This test is no t yet approved or cleared by the [place] FDA and  has been authorized for detection and/or diagnosis of SARS-CoV-2 by FDA under an Emergency Use Authorization (EUA). This EUA will remain  in effect (meaning this test can be used) for the duration of the COVID-19  declaration under Section 564(b)(1) of the Act, 21 U.S.C.section 360bbb-3(b)(1), unless the  authorization is terminated  or revoked sooner.       Influenza A by PCR NEGATIVE NEGATIVE   Influenza B by PCR NEGATIVE NEGATIVE    Comment: (NOTE) The Xpert Xpress SARS-CoV-2/FLU/RSV plus assay is intended as an aid in the diagnosis of influenza from Nasopharyngeal swab specimens and should not be used as a sole basis for treatment. Nasal washings and aspirates are unacceptable for Xpert Xpress SARS-CoV-2/FLU/RSV testing.  Fact Sheet for Patients: EntrepreneurPulse.com.au  Fact Sheet for Healthcare Providers: IncredibleEmployment.be  This test is not yet approved or cleared by the [place] FDA and has been authorized for detection and/or diagnosis of SARS-CoV-2 by FDA under an Emergency Use Authorization (EUA). This EUA will remain in effect (meaning this test can be used) for the duration of the COVID-19 declaration under Section 564(b)(1) of the Act, 21 U.S.C. section 360bbb-3(b)(1), unless the authorization is terminated or revoked.     Resp Syncytial Virus by PCR NEGATIVE NEGATIVE    Comment: (NOTE) Fact Sheet for Patients: EntrepreneurPulse.com.au  Fact Sheet for Healthcare Providers: IncredibleEmployment.be  This test is not yet approved or cleared by the [place] FDA and has been authorized for detection and/or diagnosis of SARS-CoV-2 by FDA under an Emergency Use Authorization (EUA). This EUA will remain in effect (meaning this test can be used) for the duration of the COVID-19 declaration under Section 564(b)(1) of the Act, 21 U.S.C. section 360bbb-3(b)(1), unless the authorization is terminated or revoked.  Performed at [facility], [place] [address], [place], [place] [address]    CT Renal Stone Study  Result Date: 05/27/2022 CLINICAL DATA:  Abdominal/flank pain, stone suspected EXAM: CT ABDOMEN AND PELVIS WITHOUT CONTRAST TECHNIQUE: Multidetector CT imaging of the  abdomen and pelvis was performed following the standard protocol without IV contrast. RADIATION DOSE REDUCTION: This exam was performed according to the departmental dose-optimization program which includes automated exposure control, adjustment of the mA and/or kV according to patient size and/or use of iterative reconstruction technique. COMPARISON:  10/19/2019 FINDINGS: Lower chest: Small pleural effusions right greater than left, new since previous. Subsegmental dependent atelectasis posteriorly at the lung bases. Hepatobiliary: No focal liver abnormality is seen. No gallstones, gallbladder wall thickening, or biliary dilatation. Pancreas: Unremarkable. No pancreatic ductal dilatation or surrounding inflammatory changes. Spleen: Normal in size without focal abnormality. Adrenals/Urinary Tract: No adrenal mass. Right kidney unremarkable. No urolithiasis. Advanced left renal parenchymal loss with persistent moderate presumed chronic hydronephrosis and ureterectasis terminating just below the SI joint as before. Urinary bladder incompletely distended. Stomach/Bowel: Stomach is nondistended, unremarkable. Small bowel decompressed. Appendix not discretely identified. Colon is incompletely distended by gas and fecal material with innumerable sigmoid diverticula. No regional inflammatory change or focal wall thickening identified. Vascular/Lymphatic: Moderate aortoiliac calcified atheromatous plaque. 3.9 cm fusiform abdominal aortic aneurysm (previously 3.3 cm 10/19/2019) which terminates above bifurcation. No evidence of leak or impending rupture. No abdominal or pelvic adenopathy. Reproductive: Status post hysterectomy. No adnexal masses. Other: No ascites.  No free air. Musculoskeletal: Mild lumbar levoscoliosis with multilevel spondylitic change. No acute findings. IMPRESSION: 1. No acute findings. 2. Small bilateral pleural effusions right greater than left. 3. 3.9 cm fusiform abdominal aortic aneurysm. Recommend  follow-up ultrasound every 2 years. This recommendation follows ACR consensus guidelines: White Paper of the ACR Incidental Findings Committee II on Vascular Findings. J Am Coll Radiol 2013; 10:789-794. 4. Sigmoid diverticulosis. 5.  Aortic Atherosclerosis (ICD10-I70.0). Electronically Signed  By: Lucrezia Europe M.D.   On: 05/27/2022 19:30   CT HEAD WO CONTRAST (5MM)  Result Date: 05/27/2022 CLINICAL DATA:  Dizziness EXAM: CT HEAD WITHOUT CONTRAST TECHNIQUE: Contiguous axial images were obtained from the base of the skull through the vertex without intravenous contrast. RADIATION DOSE REDUCTION: This exam was performed according to the departmental dose-optimization program which includes automated exposure control, adjustment of the mA and/or kV according to patient size and/or use of iterative reconstruction technique. COMPARISON:  01/11/2016 FINDINGS: Brain: No evidence of acute infarction, hemorrhage, hydrocephalus, extra-axial collection or mass lesion/mass effect. Global cortical atrophy. Subcortical white matter and periventricular small vessel ischemic changes. Vascular: Intracranial atherosclerosis. Skull: Normal. Negative for fracture or focal lesion. Sinuses/Orbits: The visualized paranasal sinuses are essentially clear. The mastoid air cells are unopacified. Other: None. IMPRESSION: No evidence of acute intracranial abnormality. Atrophy with small vessel ischemic changes. Electronically Signed   By: Julian Hy M.D.   On: 05/27/2022 19:04    Pending Labs Unresulted Labs (From admission, onward)     Start     Ordered   05/28/22 0500  CBC  Tomorrow morning,   R        05/27/22 2032   05/28/22 0500  Comprehensive metabolic panel  Tomorrow morning,   R        05/27/22 2032   05/28/22 0500  Magnesium  Tomorrow morning,   R        05/27/22 2032   05/28/22 0500  Phosphorus  Tomorrow morning,   R        05/27/22 2032   05/27/22 2143  Urinalysis, w/ Reflex to Culture (Infection Suspected)  -Urine, Clean Catch  (Urine Labs)  Once,   R       Question:  Specimen Source  Answer:  Urine, Clean Catch   05/27/22 2142            Vitals/Pain Today's Vitals   05/27/22 1730 05/27/22 1810 05/27/22 1830 05/27/22 2201  BP: 110/79  120/63   Pulse: 87  85   Resp: 18  16   Temp:  98.2 F (36.8 C)  97.9 F (36.6 C)  TempSrc:  Oral    SpO2: 100%  92%   Weight:      Height:      PainSc:        Isolation Precautions No active isolations  Medications Medications  0.9 %  sodium chloride infusion (has no administration in time range)  enoxaparin (LOVENOX) injection 30 mg (has no administration in time range)  acetaminophen (TYLENOL) tablet 650 mg (has no administration in time range)  prochlorperazine (COMPAZINE) injection 5 mg (has no administration in time range)  melatonin tablet 3 mg (has no administration in time range)  levothyroxine (SYNTHROID) tablet 50 mcg (has no administration in time range)  pravastatin (PRAVACHOL) tablet 40 mg (has no administration in time range)  cefTRIAXone (ROCEPHIN) 2 g in sodium chloride 0.9 % 100 mL IVPB (has no administration in time range)  sodium chloride 0.9 % bolus 1,000 mL (0 mLs Intravenous Stopped 05/27/22 2023)  cefTRIAXone (ROCEPHIN) 1 g in sodium chloride 0.9 % 100 mL IVPB (0 g Intravenous Stopped 05/27/22 1959)    Mobility Uses wheelchair at home, states she can normally take a few steps with Rolator from bed to chair.   Focused Assessments Pt has been dizzy and weak since starting ATB for kidney infection a few days ago. Coming from home, EMS reports Afib with RVR on arrival. Once  patient got to ED she was in NSR. EMS reported initial SBP in 90s. SBP 110s on arrival to ED. Pt A&Ox4. Complains of severe pain in bilateral legs with any touch d/t frequent falls at home. Bruising along both legs. Being admitted for AKI and UTI.   R Recommendations: See Admitting Provider Note  Report given to:   Additional Notes:

## 2022-05-27 NOTE — H&P (Addendum)
History and Physical  Carmen Blackwell YKD:[id number] DOB: [birth year] DOA: 05/27/2022  Referring physician: Dr. Darl Householder, [place]  PCP: Patient, No Pcp Per  Outpatient Specialists: Cardiology. Patient coming from: Home through EMS from [place] ED  Chief Complaint: Generalized weakness, dizziness  HPI: Carmen Blackwell is a 89 y.o. female with medical history significant for lymphoma, hyperlipidemia, hypothyroidism, who presented to [facility] from [facility] via EMS due to generalized weakness and dizziness which she thought it was a side effect from amoxicillin.  Recently diagnosed with UTI with hematuria on 05/15/2022.  Initially was started on ciprofloxacin.  After a few days, the antibiotic was switched to amoxicillin after return of urine culture ID and sensitivities.  It grew Klebsiella with resistance to ciprofloxacin.  Shortly after taking amoxicillin she developed generalized weakness and dizziness.  Also complains of flank pain, bilaterally.  In the ED at [facility], a CT renal stone was done which revealed no acute findings.  Small bilateral pleural effusions right greater than left.  3.9 cm fusiform abdominal aortic aneurysm.  Recommend follow-up ultrasound every 2 years.  Sigmoid diverticulosis.  Aortic atherosclerosis.  Lab studies were notable for elevated creatinine above baseline.  From creatinine 1.2 with GFR 40 to 2.72 with GFR 60, respectively.  The patient received IV fluid hydration 1 L NS x 1 as well as a dose of Rocephin.  TRH, hospitalist service was asked to admit.  At the time of this visit, feeling better, and requests to be provided a good meal.  ED Course: Tmax 98.2.  BP 120/63, pulse 85, respiration rate 16, O2 saturation 92% on room air.  Review of Systems: Review of systems as noted in the HPI. All other systems reviewed and are negative.   Past Medical History:  Diagnosis Date   Acquired hypothyroidism 04/09/2015   Aortic stenosis    Atrophic  vaginitis 12/30/2017   Formatting of this note might be different from the original. 2019: ERT vaginally   Bacterial food poisoning 07/20/2019   Formatting of this note might be different from the original. 2021   Benign positional vertigo 04/12/2019   Formatting of this note might be different from the original. 2916, 2020   Chronic kidney disease, stage III (moderate) ([place]) 04/09/2015   Chronic right-sided low back pain without sciatica 08/07/2017   Cough 08/28/2015   Essential hypertension 08/28/2015   GERD (gastroesophageal reflux disease) 04/09/2015   HLD (hyperlipidemia)    Lumbosacral radiculopathy at L5 06/18/2018   Formatting of this note might be different from the original. 2020   MALT lymphoma ([place]) 10/10/2015   Osteopenia 06/02/2017   Pancytopenia ([place]) 10/10/2015   Primary osteoarthritis involving multiple joints 05/07/2018   Raynaud's disease without gangrene 07/20/2019   Formatting of this note might be different from the original. 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 2019: chronic   Urge incontinence 05/06/2016   Past Surgical History:  Procedure Laterality Date   ABDOMINAL HYSTERECTOMY     APPENDECTOMY     LUMBAR DISC SURGERY     RIGHT/LEFT HEART CATH AND CORONARY ANGIOGRAPHY N/A 10/12/2019   Procedure: RIGHT/LEFT HEART  CATH AND CORONARY ANGIOGRAPHY;  Surgeon: Jettie Booze, MD;  Location: [place] [facility];  Service: Cardiovascular;  Laterality: N/A;   TEE WITHOUT CARDIOVERSION N/A 10/25/2019   Procedure: TRANSESOPHAGEAL ECHOCARDIOGRAM (TEE);  Surgeon: Burnell Blanks, MD;  Location: [place];  Service: Open Heart Surgery;  Laterality: N/A;   TONSILLECTOMY      TRANSCATHETER AORTIC VALVE REPLACEMENT, TRANSFEMORAL N/A 10/25/2019   Procedure: TRANSCATHETER AORTIC VALVE REPLACEMENT, TRANSFEMORAL;  Surgeon: Burnell Blanks, MD;  Location: [place];  Service: Open Heart Surgery;  Laterality: N/A;    Social History:  reports that she has never smoked. She has never used smokeless tobacco. She reports that she does not drink alcohol and does not use drugs.   Allergies  Allergen Reactions   Nsaids     unkn   Oxybutynin Other (See Comments)    dizziness dizziness    Requip [Ropinirole Hcl]    Ropinirole Other (See Comments)    Other reaction(s): Hypotension (ALLERGY/intolerance) Other reaction(s): Hypotension (ALLERGY/intolerance)     Family History  Problem Relation Age of Onset   Other Mother        Died at 23   Heart failure Brother        Died at 35   CAD Neg Hx        Negative family history   Heart attack Neg Hx    Heart disease Neg Hx       Prior to Admission medications   Medication Sig Start Date End Date Taking? Authorizing Provider  antiseptic oral rinse (BIOTENE) LIQD 15 mLs by Mouth Rinse route as needed for dry mouth. Mouth spray    [provider]  aspirin 81 MG chewable tablet Chew 1 tablet (81 mg total) by mouth daily. 10/26/19   Eileen Stanford, PA-C  Calcium Carb-Cholecalciferol (CALCIUM 600+D3 PO) Take 1 capsule by mouth daily.    [provider]  furosemide (LASIX) 20 MG tablet Take 1 tablet (20 mg total) by mouth daily as needed for fluid or edema. Pt is to set up Appt thru MyChart for Virtual Visit 04/04/22   Richardo Priest, MD  gabapentin (NEURONTIN) 100 MG capsule Take 100 mg by mouth daily as needed. Patient not taking: Reported on 11/30/2020    [provider]  hydroxychloroquine (PLAQUENIL) 200 MG tablet Take 200 mg by mouth daily.  03/09/17   [provider]  levothyroxine (SYNTHROID, LEVOTHROID) 50 MCG tablet Take 50 mcg by mouth daily before breakfast.  09/08/16    [provider]  lovastatin (MEVACOR) 20 MG tablet Take 1 tablet (20 mg total) by mouth daily. Needs appointment for future refills / 1st attempt 01/28/22   Richardo Priest, MD  Magnesium Gluconate 250 MG TABS Take 250 mg by mouth daily.     [provider]  olopatadine (PATANOL) 0.1 % ophthalmic solution Place 1 drop into both eyes 2 (two) times daily.    [provider]  potassium gluconate 595 (99 K) MG TABS tablet Take 595 mg by mouth daily. TAKES 1 TABLET DAILY    [provider]  predniSONE (DELTASONE) 5 MG tablet Take 5 mg by mouth daily.  03/09/17   [provider]  traMADol (ULTRAM) 50 MG tablet Take 50 mg by mouth at bedtime.  10/08/16   [provider]  Vaginal Lubricant (REPLENS) GEL Place 1 application vaginally See admin instructions. Every 3 days    [provider]    Physical Exam: BP 120/63  Pulse 85   Temp 98.2 F (36.8 C) (Oral)   Resp 16   Ht '5\' 7"'$  (1.702 m)   Wt 45.4 kg   SpO2 92%   BMI 15.66 kg/m   General: 89 y.o. year-old female well developed well nourished in no acute distress.  Alert and oriented x3. Cardiovascular: Regular rate and rhythm with no rubs or gallops.  No thyromegaly or JVD noted.  No lower extremity edema. 2/4 pulses in all 4 extremities. Respiratory: Clear to auscultation with no wheezes or rales. Good inspiratory effort. Abdomen: Soft nontender nondistended with normal bowel sounds x4 quadrants. Muskuloskeletal: No cyanosis, clubbing or edema noted bilaterally Neuro: CN II-XII intact, strength, sensation, reflexes Skin: No ulcerative lesions noted or rashes.  Bruising in lower extremities bilaterally. Psychiatry: Judgement and insight appear normal. Mood is appropriate for condition and setting          Labs on Admission:  Basic Metabolic Panel: Recent Labs  Lab 05/27/22 1445  NA 135  K 4.8  CL 102  CO2 19*  GLUCOSE 98  BUN 42*  CREATININE 2.72*  CALCIUM 8.7*   Liver  Function Tests: No results for input(s): "AST", "ALT", "ALKPHOS", "BILITOT", "PROT", "ALBUMIN" in the last 168 hours. No results for input(s): "LIPASE", "AMYLASE" in the last 168 hours. No results for input(s): "AMMONIA" in the last 168 hours. CBC: Recent Labs  Lab 05/27/22 1445  WBC 10.7*  HGB 10.0*  HCT 31.5*  MCV 89.5  PLT 168   Cardiac Enzymes: No results for input(s): "CKTOTAL", "CKMB", "CKMBINDEX", "TROPONINI" in the last 168 hours.  BNP (last 3 results) No results for input(s): "BNP" in the last 8760 hours.  ProBNP (last 3 results) No results for input(s): "PROBNP" in the last 8760 hours.  CBG: Recent Labs  Lab 05/27/22 1746  GLUCAP 86    Radiological Exams on Admission: CT Renal Stone Study  Result Date: 05/27/2022 CLINICAL DATA:  Abdominal/flank pain, stone suspected EXAM: CT ABDOMEN AND PELVIS WITHOUT CONTRAST TECHNIQUE: Multidetector CT imaging of the abdomen and pelvis was performed following the standard protocol without IV contrast. RADIATION DOSE REDUCTION: This exam was performed according to the departmental dose-optimization program which includes automated exposure control, adjustment of the mA and/or kV according to patient size and/or use of iterative reconstruction technique. COMPARISON:  10/19/2019 FINDINGS: Lower chest: Small pleural effusions right greater than left, new since previous. Subsegmental dependent atelectasis posteriorly at the lung bases. Hepatobiliary: No focal liver abnormality is seen. No gallstones, gallbladder wall thickening, or biliary dilatation. Pancreas: Unremarkable. No pancreatic ductal dilatation or surrounding inflammatory changes. Spleen: Normal in size without focal abnormality. Adrenals/Urinary Tract: No adrenal mass. Right kidney unremarkable. No urolithiasis. Advanced left renal parenchymal loss with persistent moderate presumed chronic hydronephrosis and ureterectasis terminating just below the SI joint as before. Urinary  bladder incompletely distended. Stomach/Bowel: Stomach is nondistended, unremarkable. Small bowel decompressed. Appendix not discretely identified. Colon is incompletely distended by gas and fecal material with innumerable sigmoid diverticula. No regional inflammatory change or focal wall thickening identified. Vascular/Lymphatic: Moderate aortoiliac calcified atheromatous plaque. 3.9 cm fusiform abdominal aortic aneurysm (previously 3.3 cm 10/19/2019) which terminates above bifurcation. No evidence of leak or impending rupture. No abdominal or pelvic adenopathy. Reproductive: Status post hysterectomy. No adnexal masses. Other: No ascites.  No free air. Musculoskeletal: Mild lumbar levoscoliosis with multilevel spondylitic change. No acute findings. IMPRESSION: 1. No acute findings. 2. Small bilateral pleural effusions right greater than left. 3. 3.9 cm fusiform abdominal aortic aneurysm. Recommend follow-up  ultrasound every 2 years. This recommendation follows ACR consensus guidelines: White Paper of the ACR Incidental Findings Committee II on Vascular Findings. J Am Coll Radiol 2013; 10:789-794. 4. Sigmoid diverticulosis. 5.  Aortic Atherosclerosis (ICD10-I70.0). Electronically Signed   By: Lucrezia Europe M.D.   On: 05/27/2022 19:30   CT HEAD WO CONTRAST (5MM)  Result Date: 05/27/2022 CLINICAL DATA:  Dizziness EXAM: CT HEAD WITHOUT CONTRAST TECHNIQUE: Contiguous axial images were obtained from the base of the skull through the vertex without intravenous contrast. RADIATION DOSE REDUCTION: This exam was performed according to the departmental dose-optimization program which includes automated exposure control, adjustment of the mA and/or kV according to patient size and/or use of iterative reconstruction technique. COMPARISON:  01/11/2016 FINDINGS: Brain: No evidence of acute infarction, hemorrhage, hydrocephalus, extra-axial collection or mass lesion/mass effect. Global cortical atrophy. Subcortical white matter  and periventricular small vessel ischemic changes. Vascular: Intracranial atherosclerosis. Skull: Normal. Negative for fracture or focal lesion. Sinuses/Orbits: The visualized paranasal sinuses are essentially clear. The mastoid air cells are unopacified. Other: None. IMPRESSION: No evidence of acute intracranial abnormality. Atrophy with small vessel ischemic changes. Electronically Signed   By: Julian Hy M.D.   On: 05/27/2022 19:04    EKG: I independently viewed the EKG done and my findings are as followed: Normal sinus rhythm rate of 94.  Nonspecific ST-T changes.  QTc 457.  Assessment/Plan Present on Admission:  AKI (acute kidney injury) ([place])  Principal Problem:   AKI (acute kidney injury) ([place])  AKI, suspect prerenal in the setting of dehydration Baseline creatinine appears to be 1.2 with GFR 40. Presented with creatinine of 2.7 with GFR of 16. Avoid nephrotoxic agents, dehydration and hypotension Received 1 L IV fluid NS x 1 in the ED Continue IV fluid maintenance NS 75 cc/h x 2 days Monitor urine output with strict I's and O's Repeat BMP in the morning.  Recently diagnosed Klebsiella UTI, POA Initially on ciprofloxacin, resistant Switched to amoxicillin, sensitive, x 3 days Complains of bilateral flank pain CT renal stone without acute findings Continue Rocephin started in the ED. Repeated UA positive for pyuria Follow in-house urine culture. Analgesics as needed  Mild anion gap metabolic acidosis Serum bicarb 19, anion gap 14 Continue IV fluid hydration Repeat BMP in the morning  Hypothyroidism Resume home levothyroxine  Hyperlipidemia Resume home regimen.  Dizziness/generalized weakness Suspect from dehydration, UTI PT OT assessment Fall precautions  Severe protein calorie malnutrition BMI 15 Severe muscle mass loss Liberalize diet    DVT prophylaxis: Subcu Lovenox daily  Code Status: Full code  Family Communication: Personal CNA at  bedside  Disposition Plan: Admitted to telemetry medical unit  Consults called: None  Admission status: Inpatient status   Status is: Inpatient The patient requires at least 2 midnights for further evaluation and treatment of present condition   Kayleen Memos MD [facility] Pager [phone]  If 7PM-7AM, please contact night-coverage [email or web address] Password [facility]  05/27/2022, 8:28 PM

## 2022-05-28 ENCOUNTER — Inpatient Hospital Stay (HOSPITAL_COMMUNITY): Payer: Medicare HMO

## 2022-05-28 ENCOUNTER — Encounter (HOSPITAL_COMMUNITY): Payer: Self-pay | Admitting: Internal Medicine

## 2022-05-28 DIAGNOSIS — N179 Acute kidney failure, unspecified: Secondary | ICD-10-CM | POA: Diagnosis not present

## 2022-05-28 DIAGNOSIS — N3000 Acute cystitis without hematuria: Secondary | ICD-10-CM | POA: Diagnosis not present

## 2022-05-28 DIAGNOSIS — I480 Paroxysmal atrial fibrillation: Secondary | ICD-10-CM

## 2022-05-28 DIAGNOSIS — I4891 Unspecified atrial fibrillation: Secondary | ICD-10-CM

## 2022-05-28 DIAGNOSIS — I714 Abdominal aortic aneurysm, without rupture, unspecified: Secondary | ICD-10-CM | POA: Insufficient documentation

## 2022-05-28 DIAGNOSIS — N1832 Chronic kidney disease, stage 3b: Secondary | ICD-10-CM | POA: Diagnosis not present

## 2022-05-28 DIAGNOSIS — D649 Anemia, unspecified: Secondary | ICD-10-CM | POA: Insufficient documentation

## 2022-05-28 LAB — ECHOCARDIOGRAM COMPLETE
Area-P 1/2: 3.68 cm2
Calc EF: 59.8 %
Height: 67 in
MV M vel: 4.97 m/s
MV Peak grad: 98.8 mmHg
S' Lateral: 2.5 cm
Single Plane A2C EF: 58.1 %
Single Plane A4C EF: 61.1 %
Weight: 1600 oz

## 2022-05-28 LAB — COMPREHENSIVE METABOLIC PANEL
ALT: 9 U/L (ref 0–44)
AST: 15 U/L (ref 15–41)
Albumin: 2.4 g/dL — ABNORMAL LOW (ref 3.5–5.0)
Alkaline Phosphatase: 57 U/L (ref 38–126)
Anion gap: 12 (ref 5–15)
BUN: 36 mg/dL — ABNORMAL HIGH (ref 8–23)
CO2: 20 mmol/L — ABNORMAL LOW (ref 22–32)
Calcium: 8.2 mg/dL — ABNORMAL LOW (ref 8.9–10.3)
Chloride: 103 mmol/L (ref 98–111)
Creatinine, Ser: 2.36 mg/dL — ABNORMAL HIGH (ref 0.44–1.00)
GFR, Estimated: 19 mL/min — ABNORMAL LOW (ref >60)
Glucose, Bld: 91 mg/dL (ref 70–99)
Potassium: 3.8 mmol/L (ref 3.5–5.1)
Sodium: 135 mmol/L (ref 135–145)
Total Bilirubin: 0.5 mg/dL (ref 0.3–1.2)
Total Protein: 5.5 g/dL — ABNORMAL LOW (ref 6.5–8.1)

## 2022-05-28 LAB — CBC
HCT: 27.2 % — ABNORMAL LOW (ref 36.0–46.0)
Hemoglobin: 8.6 g/dL — ABNORMAL LOW (ref 12.0–15.0)
MCH: 28.2 pg (ref 26.0–34.0)
MCHC: 31.6 g/dL (ref 30.0–36.0)
MCV: 89.2 fL (ref 80.0–100.0)
Platelets: 123 10*3/uL — ABNORMAL LOW (ref 150–400)
RBC: 3.05 MIL/uL — ABNORMAL LOW (ref 3.87–5.11)
RDW: 14.4 % (ref 11.5–15.5)
WBC: 5.5 10*3/uL (ref 4.0–10.5)
nRBC: 0 % (ref 0.0–0.2)

## 2022-05-28 LAB — MAGNESIUM: Magnesium: 2.2 mg/dL (ref 1.7–2.4)

## 2022-05-28 LAB — PHOSPHORUS: Phosphorus: 3.2 mg/dL (ref 2.5–4.6)

## 2022-05-28 LAB — TSH: TSH: 1.353 u[IU]/mL (ref 0.350–4.500)

## 2022-05-28 MED ORDER — METOPROLOL TARTRATE 12.5 MG HALF TABLET
12.5000 mg | ORAL_TABLET | Freq: Two times a day (BID) | ORAL | Status: DC
  Administered 2022-05-28 (×2): 12.5 mg via ORAL
  Filled 2022-05-28 (×3): qty 1

## 2022-05-28 MED ORDER — METOPROLOL TARTRATE 5 MG/5ML IV SOLN: 5.0000 mg | INTRAVENOUS | Status: DC | PRN

## 2022-05-28 NOTE — Assessment & Plan Note (Signed)
-  Stable overall -Continue current antihypertensive agents (Lopressor and Lasix). -Heart healthy/low-sodium diet discussed with patient. 

## 2022-05-28 NOTE — Assessment & Plan Note (Addendum)
-  patient has history of CKD3b. Baseline creat ~ 1.2, eGFR 40 - patient presents with increase in creat >0.3 mg/dL above baseline, creat increase >1.5x baseline presumed to have occurred within past 7 days PTA -Creatinine 2.72 on admission -Patient endorses poor oral intake prior to admission, suspect some hypovolemia contributing; no overt obstruction noted on CT on admission -Creatinine improving at discharge, 1.78

## 2022-05-28 NOTE — Progress Notes (Addendum)
Received a call regarding the patient going into A-fib with RVR. 12 lead EKG confirmed the irregularly irregular rhythm.  Unclear if this is a new diagnosis.  The patient follows with cardiology Dr. Bettina Gavia outpatient.  Will add TSH, and TTE to rule out any acute structural abnormalities.  CHADSVASC score of 4, not on oral anticoagulation prior to admission.    Labs notable for acute drop in hemoglobin from 10.0K to 8.6K this morning.  DOAC on hold due to possible GI source of anemia.  Added FOBT to r/o GI as a cause.  Consider cardiology consult in the morning.  Time 15 minutes.

## 2022-05-28 NOTE — Evaluation (Signed)
Physical Therapy Evaluation Patient Details Name: Carmen Blackwell MRN: [id number] DOB: Jun 10, 1930 Today's Date: 05/28/2022  History of Present Illness  Carmen Blackwell is a 89 y.o. female  who presented to [facility] ED from [facility] via EMS due to generalized weakness and dizziness.  Pt with AKI, recent UTI and metabolic ketoacidosis.  Small bilateral pleural effusions right greater than left.  A 3.9 cm fusiform abdominal aortic aneurysm were seen on scans.    PMH significant for lymphoma, hyperlipidemia, hypothyroidism.   Clinical Impression  Pt admitted with above diagnosis. PTA pt lived at home alone with PCA 5 hrs/day, mod I mobility in house with rollator vs power w/c. Pt reports multi recent falls. Pt currently with functional limitations due to the deficits listed below (see PT Problem List). On eval, pt required min assist bed mobility, and min assist transfers. Pt will benefit from skilled PT to increase their independence and safety with mobility to allow discharge to the venue listed below.  Recommending SNF, but pt declining. If pt has 24-hour assist, home with HHPT is appropriate.        Recommendations for follow up therapy are one component of a multi-disciplinary discharge planning process, led by the attending physician.  Recommendations may be updated based on patient status, additional functional criteria and insurance authorization.  Follow Up Recommendations Skilled nursing-short term rehab (<3 hours/day) Can patient physically be transported by private vehicle: Yes    Assistance Recommended at Discharge Frequent or constant Supervision/Assistance  Patient can return home with the following  A little help with walking and/or transfers;A little help with bathing/dressing/bathroom;Help with stairs or ramp for entrance;Assist for transportation;Assistance with cooking/housework    Equipment Recommendations None recommended by PT  Recommendations for Other  Services       Functional Status Assessment Patient has had a recent decline in their functional status and demonstrates the ability to make significant improvements in function in a reasonable and predictable amount of time.     Precautions / Restrictions Precautions Precautions: Fall Precaution Comments: Pt reports 4 recent falls at home, requring call to EMS to help her up. Restrictions Weight Bearing Restrictions: No      Mobility  Bed Mobility               General bed mobility comments: Pt received in recliner.    Transfers Overall transfer level: Needs assistance Equipment used: Rolling walker (2 wheels) Transfers: Sit to/from Stand Sit to Stand: Min assist   Step pivot transfers: Min assist       General transfer comment: therapist anterior to pt    Ambulation/Gait               General Gait Details: Pt declining, stating "my knees will buckle"  Stairs            Wheelchair Mobility    Modified Rankin (Stroke Patients Only)       Balance Overall balance assessment: Needs assistance Sitting-balance support: Feet supported, No upper extremity supported Sitting balance-Leahy Scale: Fair     Standing balance support: Bilateral upper extremity supported, During functional activity Standing balance-Leahy Scale: Poor Standing balance comment: reliant on external support                             Pertinent Vitals/Pain Pain Assessment Pain Assessment: No/denies pain    Home Living Family/patient expects to be discharged to:: Private residence Living Arrangements: Alone Available Help  at Discharge: Family;Personal care attendant (Hired assist comes in 8-11 am and 5pm-7 or 8 pm. Friends come and see her occassionally but not on a regular basis) Type of Home: House Home Access: Ramped entrance       Home Layout: One level Home Equipment: Rollator (4 wheels);Tub bench;Toilet riser;Wheelchair - power      Prior  Function Prior Level of Function : Independent/Modified Independent             Mobility Comments: uses rollator for mobility in the house but when feeling weak, will use power wheelchair. Pt reports she does not mobilize in community. She does telehealth appts with PCP. ADLs Comments: has aides that assist with bathing and dressing     Hand Dominance   Dominant Hand: Right    Extremity/Trunk Assessment   Upper Extremity Assessment Upper Extremity Assessment: Defer to OT evaluation    Lower Extremity Assessment Lower Extremity Assessment: Generalized weakness    Cervical / Trunk Assessment Cervical / Trunk Assessment: Kyphotic  Communication   Communication: No difficulties  Cognition Arousal/Alertness: Awake/alert Behavior During Therapy: Anxious Overall Cognitive Status: No family/caregiver present to determine baseline cognitive functioning                                 General Comments: A&O x 4. Self limiting, with increased anxiety when encouraged to do more        General Comments General comments (skin integrity, edema, etc.): HR up to 103 during mobility    Exercises     Assessment/Plan    PT Assessment Patient needs continued PT services  PT Problem List Decreased strength;Decreased balance;Decreased mobility;Decreased activity tolerance       PT Treatment Interventions DME instruction;Functional mobility training;Balance training;Patient/family education;Gait training;Therapeutic activities;Therapeutic exercise    PT Goals (Current goals can be found in the Care Plan section)  Acute Rehab PT Goals Patient Stated Goal: home PT Goal Formulation: With patient Time For Goal Achievement: 06/11/22 Potential to Achieve Goals: Fair    Frequency Min 3X/week     Co-evaluation               AM-PAC PT "6 Clicks" Mobility  Outcome Measure Help needed turning from your back to your side while in a flat bed without using bedrails?:  A Little Help needed moving from lying on your back to sitting on the side of a flat bed without using bedrails?: A Little Help needed moving to and from a bed to a chair (including a wheelchair)?: A Little Help needed standing up from a chair using your arms (e.g., wheelchair or bedside chair)?: A Little Help needed to walk in hospital room?: A Lot Help needed climbing 3-5 steps with a railing? : Total 6 Click Score: 15    End of Session Equipment Utilized During Treatment: Gait belt Activity Tolerance: Patient limited by fatigue Patient left: in chair;with call bell/phone within reach;with chair alarm set Nurse Communication: Mobility status PT Visit Diagnosis: Muscle weakness (generalized) (M62.81);Difficulty in walking, not elsewhere classified (R26.2)    Time: 5456-2563 PT Time Calculation (min) (ACUTE ONLY): 15 min   Charges:   PT Evaluation $PT Eval Low Complexity: 1 Low          Lorrin Goodell, PT  Office # [phone] Pager [phone]   Lorriane Shire 05/28/2022, 11:07 AM

## 2022-05-28 NOTE — Assessment & Plan Note (Addendum)
-  Patient unsure if her amoxicillin was playing or Augmentin; urine culture results reviewed and the Kleb appears resistant to pcn alone but sens to augmentin - regardless will continue with rocephin for now and de-escalate to cefadroxil most likely - CT renal study negative for acute findings

## 2022-05-28 NOTE — Progress Notes (Signed)
Echocardiogram 2D Echocardiogram has been performed.  Carmen Blackwell 05/28/2022, 2:00 PM

## 2022-05-28 NOTE — Assessment & Plan Note (Signed)
-  new onset; suspect precipitated by infection as no known history -Hold off on anticoagulation for now as she notes history of bleeding when previously on Plavix -Target rate control for now -Converted back to NSR prior to discharge.  No RWMA on echo.  No further rate control needed prior to discharge as well

## 2022-05-28 NOTE — Assessment & Plan Note (Addendum)
-  per CT on admission: 3.9 cm fusiform abdominal aortic aneurysm. Recommend follow-up ultrasound every 2 years.

## 2022-05-28 NOTE — TOC Initial Note (Signed)
Transition of Care [facility]) - Initial/Assessment Note    Patient Details  Name: Carmen Blackwell MRN: [id number] Date of Birth: 29-Oct-1930  Transition of Care [facility]) CM/SW Contact:    Coralee Pesa, [place] Phone Number: 05/28/2022, 2:18 PM  Clinical Narrative:                  CSW met with pt to discuss PT recommendation for SNF. Pt is adamant she does not want to go to SNF, but will agree to [facility] services. she says she has a daily CNA, and is looking to hire another. Pt states she has a lot of friends who assist her. CSW advised of concern for pt to safely return home, but she stated she had plenty of support. She did not want CSW notifying family, stated she already had. She confirmed her address, says she will need ambulance home. Has all of the DME, but asks if she can get a toilet riser with handles. She said she has used [facility] before and would like to use them again. CSW notified CM of new disposition. TOC will continue to follow for DC needs.  Expected Discharge Plan: [place] Barriers to Discharge: Continued Medical Work up   Patient Goals and CMS Choice Patient states their goals for this hospitalization and ongoing recovery are:: To return home. CMS Medicare.gov Compare Post Acute Care list provided to:: Patient Choice offered to / list presented to : Patient      Expected Discharge Plan and Services   Discharge Planning Services: CM Consult Post Acute Care Choice: [place] arrangements for the past 2 months: Single Family Home                                      Prior Living Arrangements/Services Living arrangements for the past 2 months: Single Family Home Lives with:: Self Patient language and need for interpreter reviewed:: Yes Do you feel safe going back to the place where you live?: Yes      Need for Family Participation in Patient Care: Yes (Comment) Care giver support system in place?: Yes (comment) Current home  services: DME, Homehealth aide Criminal Activity/Legal Involvement Pertinent to Current Situation/Hospitalization: No - Comment as needed  Activities of Daily Living Home Assistive Devices/Equipment: Environmental consultant (specify type), Wheelchair ADL Screening (condition at time of admission) Patient's cognitive ability adequate to safely complete daily activities?: Yes Is the patient deaf or have difficulty hearing?: No Does the patient have difficulty seeing, even when wearing glasses/contacts?: No Does the patient have difficulty concentrating, remembering, or making decisions?: No Patient able to express need for assistance with ADLs?: Yes Does the patient have difficulty dressing or bathing?: Yes Independently performs ADLs?: No Communication: Independent Dressing (OT): Dependent Grooming: Dependent Feeding: Dependent Bathing: Dependent Toileting: Dependent In/Out Bed: Dependent Walks in Home: Dependent Does the patient have difficulty walking or climbing stairs?: Yes Weakness of Legs: Both Weakness of Arms/Hands: Both  Permission Sought/Granted Permission sought to share information with : Family Supports Permission granted to share information with : No              Emotional Assessment Appearance:: Appears stated age Attitude/Demeanor/Rapport: Engaged Affect (typically observed): Appropriate Orientation: : Oriented to Self, Oriented to Place, Oriented to  Time, Oriented to Situation Alcohol / Substance Use: Not Applicable Psych Involvement: No (comment)  Admission diagnosis:  AKI (acute kidney injury) ([place]) [  N17.9] Urinary tract infection without hematuria, site unspecified [N39.0] Patient Active Problem List   Diagnosis Date Noted   AAA (abdominal aortic aneurysm) ([place]) 05/28/2022   Acute cystitis 05/28/2022   Paroxysmal atrial fibrillation with RVR (HCC) 05/28/2022   Normocytic anemia 05/28/2022   Acute renal failure superimposed on stage 3b chronic kidney disease ([place])  05/27/2022   HLD (hyperlipidemia)    Aortic stenosis    S/P TAVR (transcatheter aortic valve replacement) 10/25/2019   Raynaud's disease without gangrene 07/20/2019   Bacterial food poisoning 07/20/2019   Benign positional vertigo 04/12/2019   Chronic vulvitis 11/02/2018   Lumbosacral radiculopathy at L5 06/18/2018   Primary osteoarthritis involving multiple joints 05/07/2018   Atrophic vaginitis 12/30/2017   Throat burning 12/16/2017   Chronic right-sided low back pain without sciatica 08/07/2017   Severe aortic stenosis    Osteopenia 06/02/2017   Sciatica 06/02/2017   Thrombocytopenia ([place]) 03/09/2017   Risk for falls 07/14/2016   Urge incontinence 05/06/2016   MALT lymphoma ([place]) 10/10/2015   Pancytopenia ([place]) 10/10/2015   Cough 08/28/2015   Hyperlipidemia 08/28/2015   Essential hypertension 08/28/2015   Pain in joints 08/28/2015   Sjogren's syndrome ([place]) 08/28/2015   Mixed hyperlipidemia 08/28/2015   Acquired hypothyroidism 04/09/2015   Chronic kidney disease, stage III (moderate) ([place]) 04/09/2015   GERD (gastroesophageal reflux disease) 04/09/2015   PCP:  Patient, No Pcp Per Pharmacy:   [facility] [place] [address] Phone: [phone] Fax: [phone]  [facility] [address] [place] [address] Phone: [phone] Fax: [phone]  [id number] [place] [id number]: [phone]: [phone] [id number] [place], [id number]: [phone]: [phone]   Social Determinants of Health (SDOH) Social History: SDOH Screenings   Tobacco Use: Low Risk  (05/28/2022)   SDOH Interventions:     Readmission Risk Interventions     No data to display

## 2022-05-28 NOTE — Assessment & Plan Note (Addendum)
-  Chronic known history.  Baseline appears to be previously around 100k, but currently higher than baseline although may have been hemoconcentrated on admission - Continue trending PLTC

## 2022-05-28 NOTE — TOC Progression Note (Addendum)
Transition of Care [facility]) - Progression Note    Patient Details  Name: Carmen Blackwell MRN: [id number] Date of Birth: [birth year]  Transition of Care [facility]) CM/SW Contact  Carles Collet, RN Phone Number: 05/28/2022, 2:44 PM  Clinical Narrative:     Ordered toilet seat riser w handles to be delivered to her house through [place]. Referral made to [facility] for PT and OT. Patient will need [place] orders with face to face. PTAR forms filled out in computer, will need printed on day of DC  Update- [employer] does not have toilet seat riser in stock, ordered through [employer] and she will discuss copay w patient.    Expected Discharge Plan: [place] Barriers to Discharge: Continued Medical Work up  Expected Discharge Plan and Services   Discharge Planning Services: CM Consult Post Acute Care Choice: [place] arrangements for the past 2 months: [place]                 DME Arranged:  (toilet seat riser w handles) DME Agency: [employer] Date DME Agency Contacted: 05/28/22 Time DME Agency Contacted: 1440 Representative spoke with at DME Agency: [facility] HH Arranged: PT, OT [place] Agency: [facility] [place] Date [place]: 05/28/22 Time [place]: 1440 Representative spoke with at [place]: [place] ([place]) Interventions SDOH Screenings   Tobacco Use: Low Risk  (05/28/2022)    Readmission Risk Interventions     No data to display

## 2022-05-28 NOTE — Assessment & Plan Note (Signed)
-  Baseline hemoglobin approximately 9 to 10 g/dL - No signs of bleeding - Continue trending CBC

## 2022-05-28 NOTE — Hospital Course (Addendum)
Carmen Blackwell is an 89 year old female with PMH severe AS s/p TAVR, CKD3, GERD, HLD, HTN, MALT lymphoma, Sjogren's thrombocytopenia, sciatica, BPPV, Raynaud's who presented from [facility] via EMS due to generalized weakness, dizziness. She recently was treated outpatient for a Klebsiella UTI.  Initially was started on Cipro which was switched to "amoxicillin" and she had taken approximately 3 days but due to worsening weakness, she presented for further evaluation.  She had also mentioned flank pain during evaluation in the ER. CT renal stone was performed which was negative for acute findings.  Small bilateral pleural effusions were noted and a 3.9 cm fusiform AAA. She was started on Rocephin and prior urine culture results were reviewed on admission as well. PT was also consulted due to her weakness.  After admission, she also developed A-fib with RVR.  She has no known prior history of A-fib.

## 2022-05-28 NOTE — Assessment & Plan Note (Signed)
-  performed 10/25/19 for severe AS

## 2022-05-28 NOTE — Assessment & Plan Note (Signed)
-  TSH normal, 1.353 -Continue Synthroid

## 2022-05-28 NOTE — Plan of Care (Signed)
  Problem: Education: ?Goal: Knowledge of General Education information will improve ?Description: Including pain rating scale, medication(s)/side effects and non-pharmacologic comfort measures ?Outcome: Progressing ?  ?Problem: Health Behavior/Discharge Planning: ?Goal: Ability to manage health-related needs will improve ?Outcome: Progressing ?  ?Problem: Clinical Measurements: ?Goal: Ability to maintain clinical measurements within normal limits will improve ?Outcome: Progressing ?Goal: Will remain free from infection ?Outcome: Progressing ?Goal: Diagnostic test results will improve ?Outcome: Progressing ?Goal: Respiratory complications will improve ?Outcome: Progressing ?Goal: Cardiovascular complication will be avoided ?Outcome: Progressing ?  ?Problem: Activity: ?Goal: Risk for activity intolerance will decrease ?Outcome: Progressing ?  ?Problem: Nutrition: ?Goal: Adequate nutrition will be maintained ?Outcome: Progressing ?  ?Problem: Coping: ?Goal: Level of anxiety will decrease ?Outcome: Progressing ?  ?Problem: Elimination: ?Goal: Will not experience complications related to bowel motility ?Outcome: Progressing ?Goal: Will not experience complications related to urinary retention ?Outcome: Progressing ?  ?Problem: Pain Managment: ?Goal: General experience of comfort will improve ?Outcome: Progressing ?  ?Problem: Safety: ?Goal: Ability to remain free from injury will improve ?Outcome: Progressing ?  ?Problem: Skin Integrity: ?Goal: Risk for impaired skin integrity will decrease ?Outcome: Progressing ?  ?Problem: Education: ?Goal: Knowledge of General Education information will improve ?Description: Including pain rating scale, medication(s)/side effects and non-pharmacologic comfort measures ?Outcome: Progressing ?  ?Problem: Health Behavior/Discharge Planning: ?Goal: Ability to manage health-related needs will improve ?Outcome: Progressing ?  ?Problem: Clinical Measurements: ?Goal: Ability to maintain  clinical measurements within normal limits will improve ?Outcome: Progressing ?Goal: Will remain free from infection ?Outcome: Progressing ?Goal: Diagnostic test results will improve ?Outcome: Progressing ?Goal: Respiratory complications will improve ?Outcome: Progressing ?Goal: Cardiovascular complication will be avoided ?Outcome: Progressing ?  ?Problem: Activity: ?Goal: Risk for activity intolerance will decrease ?Outcome: Progressing ?  ?Problem: Nutrition: ?Goal: Adequate nutrition will be maintained ?Outcome: Progressing ?  ?Problem: Coping: ?Goal: Level of anxiety will decrease ?Outcome: Progressing ?  ?Problem: Elimination: ?Goal: Will not experience complications related to bowel motility ?Outcome: Progressing ?Goal: Will not experience complications related to urinary retention ?Outcome: Progressing ?  ?Problem: Pain Managment: ?Goal: General experience of comfort will improve ?Outcome: Progressing ?  ?Problem: Safety: ?Goal: Ability to remain free from injury will improve ?Outcome: Progressing ?  ?Problem: Skin Integrity: ?Goal: Risk for impaired skin integrity will decrease ?Outcome: Progressing ?  ?

## 2022-05-28 NOTE — Progress Notes (Signed)
Progress Note    Carmen Blackwell   VQQ:[id number]  DOB: January 19, 1931  DOA: 05/27/2022     1 PCP: Patient, No Pcp Per  Initial CC: weakness  Hospital Course: Ms. Malkiewicz is an 89 year old female with PMH severe AS s/p TAVR, CKD3, GERD, HLD, HTN, MALT lymphoma, Sjogren's thrombocytopenia, sciatica, BPPV, Raynaud's who presented from [facility] via EMS due to generalized weakness, dizziness. She recently was treated outpatient for a Klebsiella UTI.  Initially was started on Cipro which was switched to "amoxicillin" and she had taken approximately 3 days but due to worsening weakness, she presented for further evaluation.  She had also mentioned flank pain during evaluation in the ER. CT renal stone was performed which was negative for acute findings.  Small bilateral pleural effusions were noted and a 3.9 cm fusiform AAA. She was started on Rocephin and prior urine culture results were reviewed on admission as well. PT was also consulted due to her weakness.  After admission, she also developed A-fib with RVR.  She has no known prior history of A-fib.   Interval History:  Seen this morning in her room sitting up in recliner bedside.  Had worked with PT and OT some this morning.  Weakness appears somewhat improved compared to admission she says. Denies any chest pain, palpitations, shortness of breath.  Assessment and Plan: * Acute renal failure superimposed on stage 3b chronic kidney disease ([place]) - patient has history of CKD3b. Baseline creat ~ 1.2, eGFR 40 - patient presents with increase in creat >0.3 mg/dL above baseline, creat increase >1.5x baseline presumed to have occurred within past 7 days PTA -Creatinine 2.72 on admission -Patient endorses poor oral intake prior to admission, suspect some hypovolemia contributing; no overt obstruction noted on CT on admission - Continue fluids   Acute cystitis - Patient unsure if her amoxicillin was playing or Augmentin; urine  culture results reviewed and the Kleb appears resistant to pcn alone but sens to augmentin - regardless will continue with rocephin for now and de-escalate to cefadroxil most likely - CT renal study negative for acute findings  Paroxysmal atrial fibrillation with RVR ([place]) - new onset; suspect precipitated by infection as no known history -Hold off on anticoagulation for now as she notes history of bleeding when previously on Plavix -Target rate control for now - Start Lopressor - Follow-up echo  Normocytic anemia - Baseline hemoglobin approximately 9 to 10 g/dL - No signs of bleeding - Continue trending CBC  AAA (abdominal aortic aneurysm) ([place]) - per CT on admission: 3.9 cm fusiform abdominal aortic aneurysm. Recommend follow-up ultrasound every 2 years.  S/P TAVR (transcatheter aortic valve replacement) - performed 10/25/19 for severe AS  Thrombocytopenia (HCC) - Chronic known history.  Baseline appears to be previously around 100k, but currently higher than baseline although may have been hemoconcentrated on admission - Continue trending PLTC  Essential hypertension - Continue Lopressor  Acquired hypothyroidism - TSH normal, 1.353 -Continue Synthroid   Old records reviewed in assessment of this patient  Antimicrobials: Rocephin 05/27/2022 >> current  DVT prophylaxis:  enoxaparin (LOVENOX) injection 30 mg Start: 05/27/22 2100   Code Status:   Code Status: Full Code  Mobility Assessment (last 72 hours)     Mobility Assessment     Row Name 05/28/22 1104 05/28/22 0201         Does patient have an order for bedrest or is patient medically unstable -- No - Continue assessment      What is the  highest level of mobility based on the progressive mobility assessment? Level 3 (Stands with assist) - Balance while standing  and cannot march in place Level 2 ([place]) - Balance while sitting on edge of bed and cannot stand               Barriers to discharge:   Disposition Plan: Possibly SNF Status is: Inpatient  Objective: Blood pressure 112/66, pulse (!) 108, temperature 98.2 F (36.8 C), temperature source Oral, resp. rate 18, height '5\' 7"'$  (1.702 m), weight 45.4 kg, SpO2 100 %.  Examination:  Physical Exam Constitutional:      General: She is not in acute distress.    Appearance: Normal appearance.  HENT:     Head: Normocephalic and atraumatic.     Mouth/Throat:     Mouth: Mucous membranes are moist.  Eyes:     Extraocular Movements: Extraocular movements intact.  Cardiovascular:     Rate and Rhythm: Normal rate. Rhythm irregular.  Pulmonary:     Effort: Pulmonary effort is normal. No respiratory distress.     Breath sounds: Normal breath sounds. No wheezing.  Abdominal:     General: Bowel sounds are normal. There is no distension.     Palpations: Abdomen is soft.     Tenderness: There is no abdominal tenderness.  Musculoskeletal:        General: No swelling. Normal range of motion.     Cervical back: Normal range of motion and neck supple.  Skin:    General: Skin is warm and dry.  Neurological:     General: No focal deficit present.     Mental Status: She is alert.  Psychiatric:        Mood and Affect: Mood normal.        Behavior: Behavior normal.      Consultants:    Procedures:    Data Reviewed: Results for orders placed or performed during the hospital encounter of 05/27/22 (from the past 24 hour(s))  Basic metabolic panel     Status: Abnormal   Collection Time: 05/27/22  2:45 PM  Result Value Ref Range   Sodium 135 135 - 145 mmol/L   Potassium 4.8 3.5 - 5.1 mmol/L   Chloride 102 98 - 111 mmol/L   CO2 19 (L) 22 - 32 mmol/L   Glucose, Bld 98 70 - 99 mg/dL   BUN 42 (H) 8 - 23 mg/dL   Creatinine, Ser 2.72 (H) 0.44 - 1.00 mg/dL   Calcium 8.7 (L) 8.9 - 10.3 mg/dL   GFR, Estimated 16 (L) >60 mL/min   Anion gap 14 5 - 15  CBC     Status: Abnormal   Collection Time: 05/27/22  2:45 PM  Result Value Ref Range    WBC 10.7 (H) 4.0 - 10.5 K/uL   RBC 3.52 (L) 3.87 - 5.11 MIL/uL   Hemoglobin 10.0 (L) 12.0 - 15.0 g/dL   HCT 31.5 (L) 36.0 - 46.0 %   MCV 89.5 80.0 - 100.0 fL   MCH 28.4 26.0 - 34.0 pg   MCHC 31.7 30.0 - 36.0 g/dL   RDW 14.2 11.5 - 15.5 %   Platelets 168 150 - 400 K/uL   nRBC 0.0 0.0 - 0.2 %  Urinalysis, Routine w reflex microscopic -Urine, Clean Catch     Status: Abnormal   Collection Time: 05/27/22  5:04 PM  Result Value Ref Range   Color, Urine YELLOW YELLOW   APPearance CLOUDY (A) CLEAR   Specific Gravity,  Urine 1.020 1.005 - 1.030   pH 5.0 5.0 - 8.0   Glucose, UA NEGATIVE NEGATIVE mg/dL   Hgb urine dipstick NEGATIVE NEGATIVE   Bilirubin Urine NEGATIVE NEGATIVE   Ketones, ur 5 (A) NEGATIVE mg/dL   Protein, ur 30 (A) NEGATIVE mg/dL   Nitrite NEGATIVE NEGATIVE   Leukocytes,Ua TRACE (A) NEGATIVE   RBC / HPF 0-5 0 - 5 RBC/hpf   WBC, UA 11-20 0 - 5 WBC/hpf   Bacteria, UA RARE (A) NONE SEEN   Squamous Epithelial / HPF 11-20 0 - 5 /HPF   Mucus PRESENT    Hyaline Casts, UA PRESENT    Granular Casts, UA PRESENT   CBG monitoring, ED     Status: None   Collection Time: 05/27/22  5:46 PM  Result Value Ref Range   Glucose-Capillary 86 70 - 99 mg/dL  Urinalysis, w/ Reflex to Culture (Infection Suspected) -Urine, Clean Catch     Status: Abnormal   Collection Time: 05/27/22  6:14 PM  Result Value Ref Range   Specimen Source URINE, CLEAN CATCH    Color, Urine YELLOW YELLOW   APPearance HAZY (A) CLEAR   Specific Gravity, Urine 1.019 1.005 - 1.030   pH 5.0 5.0 - 8.0   Glucose, UA NEGATIVE NEGATIVE mg/dL   Hgb urine dipstick NEGATIVE NEGATIVE   Bilirubin Urine NEGATIVE NEGATIVE   Ketones, ur 5 (A) NEGATIVE mg/dL   Protein, ur 30 (A) NEGATIVE mg/dL   Nitrite NEGATIVE NEGATIVE   Leukocytes,Ua SMALL (A) NEGATIVE   RBC / HPF 0-5 0 - 5 RBC/hpf   WBC, UA 11-20 0 - 5 WBC/hpf   Bacteria, UA RARE (A) NONE SEEN   Squamous Epithelial / HPF 6-10 0 - 5 /HPF  Resp panel by RT-PCR (RSV, Flu  A&B, Covid) Anterior Nasal Swab     Status: None   Collection Time: 05/27/22  6:26 PM   Specimen: Anterior Nasal Swab  Result Value Ref Range   SARS Coronavirus 2 by RT PCR NEGATIVE NEGATIVE   Influenza A by PCR NEGATIVE NEGATIVE   Influenza B by PCR NEGATIVE NEGATIVE   Resp Syncytial Virus by PCR NEGATIVE NEGATIVE  CBC     Status: Abnormal   Collection Time: 05/28/22  2:52 AM  Result Value Ref Range   WBC 5.5 4.0 - 10.5 K/uL   RBC 3.05 (L) 3.87 - 5.11 MIL/uL   Hemoglobin 8.6 (L) 12.0 - 15.0 g/dL   HCT 27.2 (L) 36.0 - 46.0 %   MCV 89.2 80.0 - 100.0 fL   MCH 28.2 26.0 - 34.0 pg   MCHC 31.6 30.0 - 36.0 g/dL   RDW 14.4 11.5 - 15.5 %   Platelets 123 (L) 150 - 400 K/uL   nRBC 0.0 0.0 - 0.2 %  Comprehensive metabolic panel     Status: Abnormal   Collection Time: 05/28/22  2:52 AM  Result Value Ref Range   Sodium 135 135 - 145 mmol/L   Potassium 3.8 3.5 - 5.1 mmol/L   Chloride 103 98 - 111 mmol/L   CO2 20 (L) 22 - 32 mmol/L   Glucose, Bld 91 70 - 99 mg/dL   BUN 36 (H) 8 - 23 mg/dL   Creatinine, Ser 2.36 (H) 0.44 - 1.00 mg/dL   Calcium 8.2 (L) 8.9 - 10.3 mg/dL   Total Protein 5.5 (L) 6.5 - 8.1 g/dL   Albumin 2.4 (L) 3.5 - 5.0 g/dL   AST 15 15 - 41 U/L   ALT 9  0 - 44 U/L   Alkaline Phosphatase 57 38 - 126 U/L   Total Bilirubin 0.5 0.3 - 1.2 mg/dL   GFR, Estimated 19 (L) >60 mL/min   Anion gap 12 5 - 15  Magnesium     Status: None   Collection Time: 05/28/22  2:52 AM  Result Value Ref Range   Magnesium 2.2 1.7 - 2.4 mg/dL  Phosphorus     Status: None   Collection Time: 05/28/22  2:52 AM  Result Value Ref Range   Phosphorus 3.2 2.5 - 4.6 mg/dL  TSH     Status: None   Collection Time: 05/28/22  2:52 AM  Result Value Ref Range   TSH 1.353 0.350 - 4.500 uIU/mL    I have reviewed pertinent nursing notes, vitals, labs, and images as necessary. I have ordered labwork to follow up on as indicated.  I have reviewed the last notes from staff over past 24 hours. I have discussed  patient's care plan and test results with nursing staff, CM/SW, and other staff as appropriate.  Time spent: Greater than 50% of the 55 minute visit was spent in counseling/coordination of care for the patient as laid out in the A&P.   LOS: 1 day   Dwyane Dee, MD [facility] 05/28/2022, 1:27 PM

## 2022-05-28 NOTE — Evaluation (Signed)
Occupational Therapy Evaluation Patient Details Name: Carmen Blackwell MRN: [id number] DOB: [birth year] Today's Date: 05/28/2022   History of Present Illness Carmen Blackwell is a 89 y.o. female  who presented to [facility] ED from [facility] via EMS due to generalized weakness and dizziness.  Pt with AKI, recent UTI and metabolic ketoacidosis.  Small bilateral pleural effusions right greater than left.  A 3.9 cm fusiform abdominal aortic aneurysm were seen on scans.    PMH significant for lymphoma, hyperlipidemia, hypothyroidism.   Clinical Impression   Pt currently at min to mod assist level for selfcare and toileting sit to stand.  Increased anxiety throughout session with pt reporting some dizziness but also fear of falling.  BP stable at  around 113/70.  Feel she will benefit from acute care OT at this time with transition to SNF for further rehab unless her and her daughter can arrange 24 hr supervision.  She currently has some assist from aides at home but not 24 hr.       Recommendations for follow up therapy are one component of a multi-disciplinary discharge planning process, led by the attending physician.  Recommendations may be updated based on patient status, additional functional criteria and insurance authorization.   Follow Up Recommendations  Skilled nursing-short term rehab (<3 hours/day)     Assistance Recommended at Discharge Frequent or constant Supervision/Assistance  Patient can return home with the following A little help with walking and/or transfers;A little help with bathing/dressing/bathroom;Assistance with cooking/housework;Assist for transportation;Direct supervision/assist for medications management;Help with stairs or ramp for entrance    Functional Status Assessment  Patient has had a recent decline in their functional status and demonstrates the ability to make significant improvements in function in a reasonable and predictable amount of time.   Equipment Recommendations  None recommended by OT    Recommendations for Other Services       Precautions / Restrictions Precautions Precautions: Fall Restrictions Weight Bearing Restrictions: No      Mobility Bed Mobility Overal bed mobility: Needs Assistance Bed Mobility: Supine to Sit     Supine to sit: Min assist     General bed mobility comments: Min assist for bringing trunk up to sitting and scooting to the edge of the bed.    Transfers Overall transfer level: Needs assistance Equipment used: Rolling walker (2 wheels) Transfers: Sit to/from Stand, Bed to chair/wheelchair/BSC Sit to Stand: Min assist     Step pivot transfers: Min assist            Balance Overall balance assessment: Needs assistance Sitting-balance support: Feet supported, Bilateral upper extremity supported Sitting balance-Leahy Scale: Fair     Standing balance support: Bilateral upper extremity supported, During functional activity Standing balance-Leahy Scale: Poor Standing balance comment: Pt needs UE support in standing and with mobility.                           ADL either performed or assessed with clinical judgement   ADL Overall ADL's : Needs assistance/impaired Eating/Feeding: Independent;Sitting   Grooming: Wash/dry hands;Wash/dry face;Sitting;Set up       Lower Body Bathing: Sit to/from stand;Minimal assistance Lower Body Bathing Details (indicate cue type and reason): simulated Upper Body Dressing : Supervision/safety;Sitting Upper Body Dressing Details (indicate cue type and reason): simulated Lower Body Dressing: Moderate assistance;Sit to/from stand   Toilet Transfer: Minimal assistance;BSC/3in1;Rolling walker (2 wheels);Stand-pivot   Toileting- Water quality scientist and Hygiene: Moderate assistance;Sit to/from stand  Functional mobility during ADLs: Minimal assistance;Rolling walker (2 wheels) (transfers to 3:1 at bedside and to the  recliner) General ADL Comments: Pt with increased anxiety throughout session.  Reports that she "can't do" certain things even though she is able.  Does not currently have 24 hr supervision but will work on arranging with her daughter's assistance.  Would recommend short term SNF for follow up.     Vision Baseline Vision/History: 0 No visual deficits Ability to See in Adequate Light: 0 Adequate Patient Visual Report: No change from baseline Vision Assessment?: No apparent visual deficits     Perception  NT   Praxis  NT    Pertinent Vitals/Pain Pain Assessment Pain Assessment: No/denies pain     Hand Dominance Right   Extremity/Trunk Assessment Upper Extremity Assessment Upper Extremity Assessment: Generalized weakness   Lower Extremity Assessment Lower Extremity Assessment: Defer to PT evaluation   Cervical / Trunk Assessment Cervical / Trunk Assessment: Kyphotic   Communication Communication Communication: No difficulties   Cognition Arousal/Alertness: Awake/alert Behavior During Therapy: Anxious Overall Cognitive Status: No family/caregiver present to determine baseline cognitive functioning                                 General Comments: Pt with increased anxiety.  Oriented to place, time, and situation overall.  Decreased awareness with exageration of her inability to complete tasks.                Home Living Family/patient expects to be discharged to:: Private residence Living Arrangements: Alone Available Help at Discharge: Family (Hired assist comes in 8-11 am and 5pm-7 or 8 pm.  Friends come and see her occassionally but not on a regular basis) Type of Home: House Home Access: Ramped entrance     Home Layout: One level     Bathroom Shower/Tub: Teacher, early years/pre: [place]: Rollator (4 wheels);Tub bench;Toilet riser;Wheelchair - power          Prior Functioning/Environment Prior Level of  Function : Independent/Modified Independent             Mobility Comments: uses rollator for mobility in the house but when feeling weak, will use power wheelchair ADLs Comments: has aides that assist with bathing and dressing        OT Problem List: Decreased strength;Decreased activity tolerance;Impaired balance (sitting and/or standing);Decreased knowledge of use of DME or AE      OT Treatment/Interventions: Self-care/ADL training;Patient/family education;Balance training;Therapeutic activities;DME and/or AE instruction    OT Goals(Current goals can be found in the care plan section) Acute Rehab OT Goals Patient Stated Goal: Pt did not state but requested to use the toilet OT Goal Formulation: With patient Time For Goal Achievement: 06/11/22 Potential to Achieve Goals: Good  OT Frequency: Min 2X/week       AM-PAC OT "6 Clicks" Daily Activity     Outcome Measure Help from another person eating meals?: None Help from another person taking care of personal grooming?: A Little Help from another person toileting, which includes using toliet, bedpan, or urinal?: A Little Help from another person bathing (including washing, rinsing, drying)?: A Little Help from another person to put on and taking off regular upper body clothing?: None Help from another person to put on and taking off regular lower body clothing?: A Little 6 Click Score: 20   End of Session Equipment Utilized During  Treatment: Rolling walker (2 wheels) Nurse Communication: Mobility status  Activity Tolerance: Other (comment) (self limiting secondary to anxiety) Patient left: in chair;with call bell/phone within reach;with chair alarm set  OT Visit Diagnosis: Unsteadiness on feet (R26.81);Muscle weakness (generalized) (M62.81)                Time: 3976-7341 OT Time Calculation (min): 55 min Charges:  OT General Charges $OT Visit: 1 Visit OT Treatments $Self Care/Home Management : 38-52 mins  Cataleya Cristina  OTR/L 05/28/2022, 10:30 AM

## 2022-05-29 ENCOUNTER — Other Ambulatory Visit (HOSPITAL_COMMUNITY): Payer: Self-pay

## 2022-05-29 DIAGNOSIS — N1832 Chronic kidney disease, stage 3b: Secondary | ICD-10-CM | POA: Diagnosis not present

## 2022-05-29 DIAGNOSIS — I714 Abdominal aortic aneurysm, without rupture, unspecified: Secondary | ICD-10-CM

## 2022-05-29 DIAGNOSIS — N179 Acute kidney failure, unspecified: Secondary | ICD-10-CM | POA: Diagnosis not present

## 2022-05-29 DIAGNOSIS — N3000 Acute cystitis without hematuria: Secondary | ICD-10-CM | POA: Diagnosis not present

## 2022-05-29 LAB — BASIC METABOLIC PANEL
Anion gap: 8 (ref 5–15)
BUN: 24 mg/dL — ABNORMAL HIGH (ref 8–23)
CO2: 21 mmol/L — ABNORMAL LOW (ref 22–32)
Calcium: 8.3 mg/dL — ABNORMAL LOW (ref 8.9–10.3)
Chloride: 110 mmol/L (ref 98–111)
Creatinine, Ser: 1.78 mg/dL — ABNORMAL HIGH (ref 0.44–1.00)
GFR, Estimated: 27 mL/min — ABNORMAL LOW (ref >60)
Glucose, Bld: 107 mg/dL — ABNORMAL HIGH (ref 70–99)
Potassium: 3.9 mmol/L (ref 3.5–5.1)
Sodium: 139 mmol/L (ref 135–145)

## 2022-05-29 MED ORDER — POLYVINYL ALCOHOL 1.4 % OP SOLN: 2.0000 [drp] | OPHTHALMIC | Status: DC | PRN

## 2022-05-29 MED ORDER — CEFADROXIL 500 MG PO CAPS: 1000.0000 mg | ORAL_CAPSULE | Freq: Two times a day (BID) | ORAL | Status: DC

## 2022-05-29 MED ORDER — CEFADROXIL 500 MG PO CAPS
500.0000 mg | ORAL_CAPSULE | Freq: Every day | ORAL | Status: DC
  Filled 2022-05-29: qty 1

## 2022-05-29 MED ORDER — CEFADROXIL 500 MG PO CAPS
500.0000 mg | ORAL_CAPSULE | Freq: Every day | ORAL | 0 refills | Status: AC
  Filled 2022-05-29: qty 3, 3d supply, fill #0

## 2022-05-29 NOTE — Discharge Summary (Signed)
Physician Discharge Summary   Carmen Blackwell ZSW:[id number] DOB: Sep 24, 1930 DOA: 05/27/2022  PCP: Patient, No Pcp Per  Admit date: 05/27/2022 Discharge date: 05/29/2022  Barriers to discharge:   Admitted From: Home Disposition: Home Discharging physician: Dwyane Dee, MD  Recommendations for Outpatient Follow-up:  Follow-up ultrasound recommended every 2 years for AAA noted on CT Repeat BMP at follow-up  Home Health: Home health PT arranged.  Patient declined SNF recommendations Equipment/Devices:   Discharge Condition: stable CODE STATUS: Full Diet recommendation:  Diet Orders (From admission, onward)     Start     Ordered   05/29/22 0000  Diet general        05/29/22 1035   05/27/22 1948  Diet regular Room service appropriate? Yes; Fluid consistency: Thin  Diet effective now       Question Answer Comment  Room service appropriate? Yes   Fluid consistency: Thin      05/27/22 1947            Hospital Course: Ms. Rochin is an 89 year old female with PMH severe AS s/p TAVR, CKD3, GERD, HLD, HTN, MALT lymphoma, Sjogren's thrombocytopenia, sciatica, BPPV, Raynaud's who presented from [facility] via EMS due to generalized weakness, dizziness. She recently was treated outpatient for a Klebsiella UTI.  Initially was started on Cipro which was switched to "amoxicillin" and she had taken approximately 3 days but due to worsening weakness, she presented for further evaluation.  She had also mentioned flank pain during evaluation in the ER. CT renal stone was performed which was negative for acute findings.  Small bilateral pleural effusions were noted and a 3.9 cm fusiform AAA. She was started on Rocephin and prior urine culture results were reviewed on admission as well. PT was also consulted due to her weakness.  After admission, she also developed A-fib with RVR.  She has no known prior history of A-fib.  This was considered precipitated from infection and  resolved spontaneously soon after admission.  She briefly required dose of Lopressor for rate control but after converting back to NSR, no further rate control needed.  Assessment and Plan: * Acute renal failure superimposed on stage 3b chronic kidney disease ([place]) - patient has history of CKD3b. Baseline creat ~ 1.2, eGFR 40 - patient presents with increase in creat >0.3 mg/dL above baseline, creat increase >1.5x baseline presumed to have occurred within past 7 days PTA -Creatinine 2.72 on admission -Patient endorses poor oral intake prior to admission, suspect some hypovolemia contributing; no overt obstruction noted on CT on admission -Creatinine improving at discharge, 1.78   Acute cystitis - Patient on Augmentin prior to admission although endorsed progressively feeling more weak - She was initiated on Rocephin during hospitalization and de-escalated to her cefadroxil to complete course at discharge - CT renal study negative for acute findings  Paroxysmal atrial fibrillation with RVR ([place]) - new onset; suspect precipitated by infection as no known history -Hold off on anticoagulation for now as she notes history of bleeding when previously on Plavix -Target rate control for now -Converted back to NSR prior to discharge.  No RWMA on echo.  No further rate control needed prior to discharge as well  Normocytic anemia - Baseline hemoglobin approximately 9 to 10 g/dL - No signs of bleeding  AAA (abdominal aortic aneurysm) (HCC) - per CT on admission: 3.9 cm fusiform abdominal aortic aneurysm. Recommend follow-up ultrasound every 2 years.  S/P TAVR (transcatheter aortic valve replacement) - performed 10/25/19 for severe AS  Thrombocytopenia (  [place]) - Chronic known history.  Baseline appears to be previously around 100k - PLTC 123k   Essential hypertension - Continue Lopressor  Acquired hypothyroidism - TSH normal, 1.353 -Continue Synthroid   The patient's chronic medical  conditions were treated accordingly per the patient's home medication regimen except as noted.  On day of discharge, patient was felt deemed stable for discharge. Patient/family member advised to call PCP or come back to ER if needed.   Principal Diagnosis: Acute renal failure superimposed on stage 3b chronic kidney disease [facility])  Discharge Diagnoses: Active Hospital Problems   Diagnosis Date Noted   Acute renal failure superimposed on stage 3b chronic kidney disease ([place]) 05/27/2022    Priority: 1.   Acute cystitis 05/28/2022    Priority: 1.   Paroxysmal atrial fibrillation with RVR ([place]) 05/28/2022    Priority: 2.   AAA (abdominal aortic aneurysm) ([place]) 05/28/2022   Normocytic anemia 05/28/2022   S/P TAVR (transcatheter aortic valve replacement) 10/25/2019   Thrombocytopenia ([place]) 03/09/2017   Essential hypertension 08/28/2015   Acquired hypothyroidism 04/09/2015    Resolved Hospital Problems  No resolved problems to display.     Discharge Instructions     Diet general   Complete by: As directed    Increase activity slowly   Complete by: As directed       Allergies as of 05/29/2022       Reactions   Augmentin [amoxicillin-pot Clavulanate] Other (See Comments)   Leg weakness Vasomotor symptoms   Ditropan [oxybutynin] Other (See Comments)   Dizziness    Requip [ropinirole] Other (See Comments)   Hypotension         Medication List     STOP taking these medications    amoxicillin-clavulanate 875-125 MG tablet Commonly known as: AUGMENTIN   furosemide 20 MG tablet Commonly known as: LASIX       TAKE these medications    antiseptic oral rinse Liqd 15 mLs by Mouth Rinse route 4 (four) times daily as needed for dry mouth. Mouth spray   aspirin EC 81 MG tablet Take 81 mg by mouth daily.   CALCIUM 600+D3 PO Take 1 capsule by mouth daily.   cefadroxil 500 MG capsule Commonly known as: DURICEF Take 1 capsule (500 mg total) by mouth daily for 3 days.    hydroxychloroquine 200 MG tablet Commonly known as: PLAQUENIL Take 200 mg by mouth daily.   levothyroxine 50 MCG tablet Commonly known as: SYNTHROID Take 50 mcg by mouth daily before breakfast.   lovastatin 20 MG tablet Commonly known as: MEVACOR Take 1 tablet (20 mg total) by mouth daily. Needs appointment for future refills / 1st attempt What changed: additional instructions   Magnesium Gluconate 250 MG Tabs Take 250 mg by mouth daily.   potassium gluconate 595 (99 K) MG Tabs tablet Take 595 mg by mouth daily.   traMADol 50 MG tablet Commonly known as: ULTRAM Take 50 mg by mouth at bedtime.               Durable Medical Equipment  (From admission, onward)           Start     Ordered   05/28/22 1426  For home use only DME Other see comment  Once       Comments: Toilet seat riser with handles Height 5'7" weight 90 pounds  Question:  Length of Need  Answer:  Lifetime   05/28/22 1426  Allergies  Allergen Reactions   Augmentin [Amoxicillin-Pot Clavulanate] Other (See Comments)    Leg weakness Vasomotor symptoms   Ditropan [Oxybutynin] Other (See Comments)    Dizziness    Requip [Ropinirole] Other (See Comments)    Hypotension      Consultations:   Procedures:   Discharge Exam: BP (!) 165/73 (BP Location: Right Arm)   Pulse 69   Temp 98.3 F (36.8 C) (Oral)   Resp 17   Ht '5\' 7"'$  (1.702 m)   Wt 45.4 kg   SpO2 96%   BMI 15.66 kg/m  Physical Exam Constitutional:      General: She is not in acute distress.    Appearance: Normal appearance.  HENT:     Head: Normocephalic and atraumatic.     Mouth/Throat:     Mouth: Mucous membranes are moist.  Eyes:     Extraocular Movements: Extraocular movements intact.  Cardiovascular:     Rate and Rhythm: Normal rate and regular rhythm.  Pulmonary:     Effort: Pulmonary effort is normal. No respiratory distress.     Breath sounds: Normal breath sounds. No wheezing.  Abdominal:      General: Bowel sounds are normal. There is no distension.     Palpations: Abdomen is soft.     Tenderness: There is no abdominal tenderness.  Musculoskeletal:        General: No swelling. Normal range of motion.     Cervical back: Normal range of motion and neck supple.  Skin:    General: Skin is warm and dry.  Neurological:     General: No focal deficit present.     Mental Status: She is alert.  Psychiatric:        Mood and Affect: Mood normal.        Behavior: Behavior normal.      The results of significant diagnostics from this hospitalization (including imaging, microbiology, ancillary and laboratory) are listed below for reference.   Microbiology: Recent Results (from the past 240 hour(s))  Resp panel by RT-PCR (RSV, Flu A&B, Covid) Anterior Nasal Swab     Status: None   Collection Time: 05/27/22  6:26 PM   Specimen: Anterior Nasal Swab  Result Value Ref Range Status   SARS Coronavirus 2 by RT PCR NEGATIVE NEGATIVE Final    Comment: (NOTE) SARS-CoV-2 target nucleic acids are NOT DETECTED.  The SARS-CoV-2 RNA is generally detectable in upper respiratory specimens during the acute phase of infection. The lowest concentration of SARS-CoV-2 viral copies this assay can detect is 138 copies/mL. A negative result does not preclude SARS-Cov-2 infection and should not be used as the sole basis for treatment or other patient management decisions. A negative result may occur with  improper specimen collection/handling, submission of specimen other than nasopharyngeal swab, presence of viral mutation(s) within the areas targeted by this assay, and inadequate number of viral copies(<138 copies/mL). A negative result must be combined with clinical observations, patient history, and epidemiological information. The expected result is Negative.  Fact Sheet for Patients:  EntrepreneurPulse.com.au  Fact Sheet for Healthcare Providers:   IncredibleEmployment.be  This test is no t yet approved or cleared by the [place] FDA and  has been authorized for detection and/or diagnosis of SARS-CoV-2 by FDA under an Emergency Use Authorization (EUA). This EUA will remain  in effect (meaning this test can be used) for the duration of the COVID-19 declaration under Section 564(b)(1) of the Act, 21 U.S.C.section 360bbb-3(b)(1), unless the authorization is  terminated  or revoked sooner.       Influenza A by PCR NEGATIVE NEGATIVE Final   Influenza B by PCR NEGATIVE NEGATIVE Final    Comment: (NOTE) The Xpert Xpress SARS-CoV-2/FLU/RSV plus assay is intended as an aid in the diagnosis of influenza from Nasopharyngeal swab specimens and should not be used as a sole basis for treatment. Nasal washings and aspirates are unacceptable for Xpert Xpress SARS-CoV-2/FLU/RSV testing.  Fact Sheet for Patients: EntrepreneurPulse.com.au  Fact Sheet for Healthcare Providers: IncredibleEmployment.be  This test is not yet approved or cleared by the [place] FDA and has been authorized for detection and/or diagnosis of SARS-CoV-2 by FDA under an Emergency Use Authorization (EUA). This EUA will remain in effect (meaning this test can be used) for the duration of the COVID-19 declaration under Section 564(b)(1) of the Act, 21 U.S.C. section 360bbb-3(b)(1), unless the authorization is terminated or revoked.     Resp Syncytial Virus by PCR NEGATIVE NEGATIVE Final    Comment: (NOTE) Fact Sheet for Patients: EntrepreneurPulse.com.au  Fact Sheet for Healthcare Providers: IncredibleEmployment.be  This test is not yet approved or cleared by the [place] FDA and has been authorized for detection and/or diagnosis of SARS-CoV-2 by FDA under an Emergency Use Authorization (EUA). This EUA will remain in effect (meaning this test can be used) for  the duration of the COVID-19 declaration under Section 564(b)(1) of the Act, 21 U.S.C. section 360bbb-3(b)(1), unless the authorization is terminated or revoked.  Performed at [facility], [place] [address] [place], [place] [address]      Labs: BNP (last 3 results) No results for input(s): "BNP" in the last 8760 hours. Basic Metabolic Panel: Recent Labs  Lab 05/27/22 1445 05/28/22 0252 05/29/22 1020  NA 135 135 139  K 4.8 3.8 3.9  CL 102 103 110  CO2 19* 20* 21*  GLUCOSE 98 91 107*  BUN 42* 36* 24*  CREATININE 2.72* 2.36* 1.78*  CALCIUM 8.7* 8.2* 8.3*  MG  --  2.2  --   PHOS  --  3.2  --    Liver Function Tests: Recent Labs  Lab 05/28/22 0252  AST 15  ALT 9  ALKPHOS 57  BILITOT 0.5  PROT 5.5*  ALBUMIN 2.4*   No results for input(s): "LIPASE", "AMYLASE" in the last 168 hours. No results for input(s): "AMMONIA" in the last 168 hours. CBC: Recent Labs  Lab 05/27/22 1445 05/28/22 0252  WBC 10.7* 5.5  HGB 10.0* 8.6*  HCT 31.5* 27.2*  MCV 89.5 89.2  PLT 168 123*   Cardiac Enzymes: No results for input(s): "CKTOTAL", "CKMB", "CKMBINDEX", "TROPONINI" in the last 168 hours. BNP: Invalid input(s): "POCBNP" CBG: Recent Labs  Lab 05/27/22 1746  GLUCAP 86   D-Dimer No results for input(s): "DDIMER" in the last 72 hours. Hgb A1c No results for input(s): "HGBA1C" in the last 72 hours. Lipid Profile No results for input(s): "CHOL", "HDL", "LDLCALC", "TRIG", "CHOLHDL", "LDLDIRECT" in the last 72 hours. Thyroid function studies Recent Labs    05/28/22 0252  TSH 1.353   Anemia work up No results for input(s): "VITAMINB12", "FOLATE", "FERRITIN", "TIBC", "IRON", "RETICCTPCT" in the last 72 hours. Urinalysis    Component Value Date/Time   COLORURINE YELLOW 05/27/2022 1814   APPEARANCEUR HAZY (A) 05/27/2022 1814   LABSPEC 1.019 05/27/2022 1814   PHURINE 5.0 05/27/2022 1814   GLUCOSEU NEGATIVE 05/27/2022 1814   HGBUR NEGATIVE 05/27/2022 1814    BILIRUBINUR NEGATIVE 05/27/2022 1814   KETONESUR 5 (  A) 05/27/2022 1814   PROTEINUR 30 (A) 05/27/2022 1814   NITRITE NEGATIVE 05/27/2022 1814   LEUKOCYTESUR SMALL (A) 05/27/2022 1814   Sepsis Labs Recent Labs  Lab 05/27/22 1445 05/28/22 0252  WBC 10.7* 5.5   Microbiology Recent Results (from the past 240 hour(s))  Resp panel by RT-PCR (RSV, Flu A&B, Covid) Anterior Nasal Swab     Status: None   Collection Time: 05/27/22  6:26 PM   Specimen: Anterior Nasal Swab  Result Value Ref Range Status   SARS Coronavirus 2 by RT PCR NEGATIVE NEGATIVE Final    Comment: (NOTE) SARS-CoV-2 target nucleic acids are NOT DETECTED.  The SARS-CoV-2 RNA is generally detectable in upper respiratory specimens during the acute phase of infection. The lowest concentration of SARS-CoV-2 viral copies this assay can detect is 138 copies/mL. A negative result does not preclude SARS-Cov-2 infection and should not be used as the sole basis for treatment or other patient management decisions. A negative result may occur with  improper specimen collection/handling, submission of specimen other than nasopharyngeal swab, presence of viral mutation(s) within the areas targeted by this assay, and inadequate number of viral copies(<138 copies/mL). A negative result must be combined with clinical observations, patient history, and epidemiological information. The expected result is Negative.  Fact Sheet for Patients:  EntrepreneurPulse.com.au  Fact Sheet for Healthcare Providers:  IncredibleEmployment.be  This test is no t yet approved or cleared by the [place] FDA and  has been authorized for detection and/or diagnosis of SARS-CoV-2 by FDA under an Emergency Use Authorization (EUA). This EUA will remain  in effect (meaning this test can be used) for the duration of the COVID-19 declaration under Section 564(b)(1) of the Act, 21 U.S.C.section 360bbb-3(b)(1), unless the  authorization is terminated  or revoked sooner.       Influenza A by PCR NEGATIVE NEGATIVE Final   Influenza B by PCR NEGATIVE NEGATIVE Final    Comment: (NOTE) The Xpert Xpress SARS-CoV-2/FLU/RSV plus assay is intended as an aid in the diagnosis of influenza from Nasopharyngeal swab specimens and should not be used as a sole basis for treatment. Nasal washings and aspirates are unacceptable for Xpert Xpress SARS-CoV-2/FLU/RSV testing.  Fact Sheet for Patients: EntrepreneurPulse.com.au  Fact Sheet for Healthcare Providers: IncredibleEmployment.be  This test is not yet approved or cleared by the [place] FDA and has been authorized for detection and/or diagnosis of SARS-CoV-2 by FDA under an Emergency Use Authorization (EUA). This EUA will remain in effect (meaning this test can be used) for the duration of the COVID-19 declaration under Section 564(b)(1) of the Act, 21 U.S.C. section 360bbb-3(b)(1), unless the authorization is terminated or revoked.     Resp Syncytial Virus by PCR NEGATIVE NEGATIVE Final    Comment: (NOTE) Fact Sheet for Patients: EntrepreneurPulse.com.au  Fact Sheet for Healthcare Providers: IncredibleEmployment.be  This test is not yet approved or cleared by the [place] FDA and has been authorized for detection and/or diagnosis of SARS-CoV-2 by FDA under an Emergency Use Authorization (EUA). This EUA will remain in effect (meaning this test can be used) for the duration of the COVID-19 declaration under Section 564(b)(1) of the Act, 21 U.S.C. section 360bbb-3(b)(1), unless the authorization is terminated or revoked.  Performed at [facility], [place] [address], [place], [place] [address]     Procedures/Studies: ECHOCARDIOGRAM COMPLETE  Result Date: 05/28/2022    ECHOCARDIOGRAM REPORT   Patient Name:   Carmen Blackwell Date of Exam: 05/28/2022 Medical Rec #:  [id number]            Height:       67.0 in Accession #:    [id number]           Weight:       100.0 lb Date of Birth:  September 27, 1930            BSA:          1.506 m Patient Age:    89 years             BP:           112/66 mmHg Patient Gender: F                    HR:           97 bpm. Exam Location:  Inpatient Procedure: 2D Echo, Cardiac Doppler and Color Doppler Indications:    I48.91* Unspeicified atrial fibrillation  History:        Patient has prior history of Echocardiogram examinations. Risk                 Factors:Hypertension and Dyslipidemia.  Sonographer:    Phineas Douglas Referring Phys: [id number] [place]  1. Left ventricular ejection fraction, by estimation, is 60 to 65%. The left ventricle has normal function. The left ventricle has no regional wall motion abnormalities. There is mild concentric left ventricular hypertrophy. Left ventricular diastolic function could not be evaluated.  2. Right ventricular systolic function is normal. The right ventricular size is normal. There is normal pulmonary artery systolic pressure.  3. Left atrial size was mildly dilated.  4. The mitral valve is normal in structure. Trivial mitral valve regurgitation. No evidence of mitral stenosis.  5. The aortic valve is normal in structure. Aortic valve regurgitation is not visualized. No aortic stenosis is present.  6. The inferior vena cava is normal in size with <50% respiratory variability, suggesting right atrial pressure of 8 mmHg. FINDINGS  Left Ventricle: Left ventricular ejection fraction, by estimation, is 60 to 65%. The left ventricle has normal function. The left ventricle has no regional wall motion abnormalities. The left ventricular internal cavity size was normal in size. There is  mild concentric left ventricular hypertrophy. Left ventricular diastolic function could not be evaluated due to atrial fibrillation. Left ventricular diastolic function could not be evaluated. Right Ventricle: The right  ventricular size is normal. No increase in right ventricular wall thickness. Right ventricular systolic function is normal. There is normal pulmonary artery systolic pressure. The tricuspid regurgitant velocity is 1.98 m/s, and  with an assumed right atrial pressure of 8 mmHg, the estimated right ventricular systolic pressure is 03.5 mmHg. Left Atrium: Left atrial size was mildly dilated. Right Atrium: Right atrial size was normal in size. Pericardium: Trivial pericardial effusion is present. Mitral Valve: The mitral valve is normal in structure. Trivial mitral valve regurgitation. No evidence of mitral valve stenosis. Tricuspid Valve: The tricuspid valve is normal in structure. Tricuspid valve regurgitation is mild . No evidence of tricuspid stenosis. Aortic Valve: The aortic valve is normal in structure. Aortic valve regurgitation is not visualized. No aortic stenosis is present. Pulmonic Valve: The pulmonic valve was normal in structure. Pulmonic valve regurgitation is not visualized. No evidence of pulmonic stenosis. Aorta: The aortic root is normal in size and structure. Venous: The inferior vena cava is normal in size with less than 50% respiratory variability, suggesting right atrial pressure of 8 mmHg. IAS/Shunts: No  atrial level shunt detected by color flow Doppler.  LEFT VENTRICLE PLAX 2D LVIDd:         3.80 cm     Diastology LVIDs:         2.50 cm     LV e' medial:    9.14 cm/s LV PW:         1.30 cm     LV E/e' medial:  11.2 LV IVS:        1.10 cm     LV e' lateral:   10.40 cm/s LVOT diam:     1.90 cm     LV E/e' lateral: 9.8 LV SV:         35 LV SV Index:   23 LVOT Area:     2.84 cm  LV Volumes (MOD) LV vol d, MOD A2C: 52.5 ml LV vol d, MOD A4C: 66.0 ml LV vol s, MOD A2C: 22.0 ml LV vol s, MOD A4C: 25.7 ml LV SV MOD A2C:     30.5 ml LV SV MOD A4C:     66.0 ml LV SV MOD BP:      35.6 ml RIGHT VENTRICLE            IVC RV Basal diam:  3.20 cm    IVC diam: 1.70 cm RV S prime:     9.14 cm/s TAPSE (M-mode):  1.4 cm LEFT ATRIUM             Index        RIGHT ATRIUM           Index LA diam:        3.20 cm 2.12 cm/m   RA Area:     10.50 cm LA Vol (A2C):   47.3 ml 31.40 ml/m  RA Volume:   19.90 ml  13.21 ml/m LA Vol (A4C):   33.6 ml 22.31 ml/m LA Biplane Vol: 41.4 ml 27.49 ml/m  AORTIC VALVE LVOT Vmax:   74.20 cm/s LVOT Vmean:  47.900 cm/s LVOT VTI:    0.123 m  AORTA Ao Root diam: 3.10 cm Ao Asc diam:  2.10 cm MITRAL VALVE                TRICUSPID VALVE MV Area (PHT): 3.68 cm     TR Peak grad:   15.7 mmHg MV Decel Time: 206 msec     TR Vmax:        198.00 cm/s MR Peak grad: 98.8 mmHg MR Mean grad: 65.0 mmHg     SHUNTS MR Vmax:      497.00 cm/s   Systemic VTI:  0.12 m MR Vmean:     383.0 cm/s    Systemic Diam: 1.90 cm MV E velocity: 102.00 cm/s Skeet Latch MD Electronically signed by Skeet Latch MD Signature Date/Time: 05/28/2022/2:14:01 PM    Final    CT Renal Stone Study  Result Date: 05/27/2022 CLINICAL DATA:  Abdominal/flank pain, stone suspected EXAM: CT ABDOMEN AND PELVIS WITHOUT CONTRAST TECHNIQUE: Multidetector CT imaging of the abdomen and pelvis was performed following the standard protocol without IV contrast. RADIATION DOSE REDUCTION: This exam was performed according to the departmental dose-optimization program which includes automated exposure control, adjustment of the mA and/or kV according to patient size and/or use of iterative reconstruction technique. COMPARISON:  10/19/2019 FINDINGS: Lower chest: Small pleural effusions right greater than left, new since previous. Subsegmental dependent atelectasis posteriorly at the lung bases. Hepatobiliary: No focal liver abnormality is seen. No gallstones,  gallbladder wall thickening, or biliary dilatation. Pancreas: Unremarkable. No pancreatic ductal dilatation or surrounding inflammatory changes. Spleen: Normal in size without focal abnormality. Adrenals/Urinary Tract: No adrenal mass. Right kidney unremarkable. No urolithiasis. Advanced left  renal parenchymal loss with persistent moderate presumed chronic hydronephrosis and ureterectasis terminating just below the SI joint as before. Urinary bladder incompletely distended. Stomach/Bowel: Stomach is nondistended, unremarkable. Small bowel decompressed. Appendix not discretely identified. Colon is incompletely distended by gas and fecal material with innumerable sigmoid diverticula. No regional inflammatory change or focal wall thickening identified. Vascular/Lymphatic: Moderate aortoiliac calcified atheromatous plaque. 3.9 cm fusiform abdominal aortic aneurysm (previously 3.3 cm 10/19/2019) which terminates above bifurcation. No evidence of leak or impending rupture. No abdominal or pelvic adenopathy. Reproductive: Status post hysterectomy. No adnexal masses. Other: No ascites.  No free air. Musculoskeletal: Mild lumbar levoscoliosis with multilevel spondylitic change. No acute findings. IMPRESSION: 1. No acute findings. 2. Small bilateral pleural effusions right greater than left. 3. 3.9 cm fusiform abdominal aortic aneurysm. Recommend follow-up ultrasound every 2 years. This recommendation follows ACR consensus guidelines: White Paper of the ACR Incidental Findings Committee II on Vascular Findings. J Am Coll Radiol 2013; 10:789-794. 4. Sigmoid diverticulosis. 5.  Aortic Atherosclerosis (ICD10-I70.0). Electronically Signed   By: Lucrezia Europe M.D.   On: 05/27/2022 19:30   CT HEAD WO CONTRAST (5MM)  Result Date: 05/27/2022 CLINICAL DATA:  Dizziness EXAM: CT HEAD WITHOUT CONTRAST TECHNIQUE: Contiguous axial images were obtained from the base of the skull through the vertex without intravenous contrast. RADIATION DOSE REDUCTION: This exam was performed according to the departmental dose-optimization program which includes automated exposure control, adjustment of the mA and/or kV according to patient size and/or use of iterative reconstruction technique. COMPARISON:  01/11/2016 FINDINGS: Brain: No  evidence of acute infarction, hemorrhage, hydrocephalus, extra-axial collection or mass lesion/mass effect. Global cortical atrophy. Subcortical white matter and periventricular small vessel ischemic changes. Vascular: Intracranial atherosclerosis. Skull: Normal. Negative for fracture or focal lesion. Sinuses/Orbits: The visualized paranasal sinuses are essentially clear. The mastoid air cells are unopacified. Other: None. IMPRESSION: No evidence of acute intracranial abnormality. Atrophy with small vessel ischemic changes. Electronically Signed   By: Julian Hy M.D.   On: 05/27/2022 19:04     Time coordinating discharge: Over 30 minutes    Dwyane Dee, MD  [facility] 05/29/2022, 5:39 PM

## 2022-05-29 NOTE — Progress Notes (Signed)
Mobility Specialist Progress Note   05/29/22 1432  Mobility  Activity Dangled on edge of bed  Level of Assistance Minimal assist, patient does 75% or more  Assistive Device Other (Comment) (HHA)  Activity Response Tolerated well  Mobility Referral Yes  $Mobility charge 1 Mobility   Received pt in bed c/o being tired but w/ mod encouragement pt agreeable to eating lunch sitting EOB. MinA to help w/ LE d/t slight pain and weakness but no fault on transition. PTAR shortly arriving afterwards and assisted pt w/ d/c. Left w/ PTAR still present and RN notified.   Holland Falling Mobility Specialist Please contact via SecureChat or  Rehab office at [phone]

## 2022-05-29 NOTE — Plan of Care (Signed)
  Problem: Education: Goal: Knowledge of General Education information will improve Description: Including pain rating scale, medication(s)/side effects and non-pharmacologic comfort measures Outcome: Not Progressing   Problem: Health Behavior/Discharge Planning: Goal: Ability to manage health-related needs will improve Outcome: Not Progressing   Problem: Clinical Measurements: Goal: Will remain free from infection Outcome: Progressing Goal: Respiratory complications will improve Outcome: Progressing

## 2022-05-29 NOTE — Progress Notes (Signed)
Physical Therapy Treatment Patient Details Name: Carmen Blackwell MRN: [id number] DOB: 02-16-1931 Today's Date: 05/29/2022   History of Present Illness Carmen Blackwell is a 89 y.o. female  who presented to [facility] ED from [facility] via EMS due to generalized weakness and dizziness.  Pt with AKI, recent UTI and metabolic ketoacidosis.  Small bilateral pleural effusions right greater than left.  A 3.9 cm fusiform abdominal aortic aneurysm were seen on scans.    PMH significant for lymphoma, hyperlipidemia, hypothyroidism.    PT Comments    Pt was seen for progressing her to stand and walk a few steps.  Pt is still recommended to SNF care but has elected to go home.  Her family is aware, and will have to follow up with HHPT as her next option.  Pt is still a greater fall risk and will need 24/7 attendance to succeed at home.  Follow up with PCP as pt is set to leave.   Recommendations for follow up therapy are one component of a multi-disciplinary discharge planning process, led by the attending physician.  Recommendations may be updated based on patient status, additional functional criteria and insurance authorization.  Follow Up Recommendations  Skilled nursing-short term rehab (<3 hours/day) Can patient physically be transported by private vehicle: Yes   Assistance Recommended at Discharge Frequent or constant Supervision/Assistance  Patient can return home with the following A little help with walking and/or transfers;A little help with bathing/dressing/bathroom;Help with stairs or ramp for entrance;Assist for transportation;Assistance with cooking/housework   Equipment Recommendations  None recommended by PT    Recommendations for Other Services       Precautions / Restrictions Precautions Precautions: Fall Precaution Comments: Pt reports 4 recent falls at home, requring call to EMS to help her up. Restrictions Weight Bearing Restrictions: No     Mobility  Bed  Mobility Overal bed mobility: Needs Assistance Bed Mobility: Supine to Sit, Sit to Supine     Supine to sit: Min assist Sit to supine: Mod assist        Transfers Overall transfer level: Needs assistance Equipment used: Rolling walker (2 wheels) Transfers: Sit to/from Stand Sit to Stand: Min assist           General transfer comment: pt was cued for sequence but is resistant to direction    Ambulation/Gait Ambulation/Gait assistance: Min guard, Min assist Gait Distance (Feet): 8 Feet Assistive device: Rolling walker (2 wheels) Gait Pattern/deviations: Step-through pattern, Step-to pattern, Decreased stride length, Wide base of support, Trunk flexed Gait velocity: reduced Gait velocity interpretation: <1.31 ft/sec, indicative of household ambulator Pre-gait activities: standing balance correction General Gait Details: pt took only a few steps, telling PT that she could not do more and that she was done   [employer] Mobility    Modified Rankin (Stroke Patients Only)       Balance     Sitting balance-Leahy Scale: Fair     Standing balance support: Bilateral upper extremity supported, During functional activity Standing balance-Leahy Scale: Poor                              Cognition Arousal/Alertness: Awake/alert Behavior During Therapy: Anxious Overall Cognitive Status: No family/caregiver present to determine baseline cognitive functioning  General Comments: self limiting and anxious, feels she needs to voice limits before she tries to move        Exercises      General Comments General comments (skin integrity, edema, etc.): pt is taking only a few steps, tending to predetermine her ability before attempting walking.  Was mildly agitated about the whole process, hesitant to commit      Pertinent Vitals/Pain Pain Assessment Pain Assessment: Faces Faces Pain Scale:  Hurts a little bit Pain Location: general soreness Pain Descriptors / Indicators: Guarding    Home Living                          Prior Function            PT Goals (current goals can now be found in the care plan section) Acute Rehab PT Goals Patient Stated Goal: home Progress towards PT goals: Progressing toward goals (very slowly)    Frequency    Min 3X/week      PT Plan Current plan remains appropriate    Co-evaluation              AM-PAC PT "6 Clicks" Mobility   Outcome Measure  Help needed turning from your back to your side while in a flat bed without using bedrails?: A Little Help needed moving from lying on your back to sitting on the side of a flat bed without using bedrails?: A Little Help needed moving to and from a bed to a chair (including a wheelchair)?: A Little Help needed standing up from a chair using your arms (e.g., wheelchair or bedside chair)?: A Little Help needed to walk in hospital room?: A Lot Help needed climbing 3-5 steps with a railing? : Total 6 Click Score: 15    End of Session Equipment Utilized During Treatment: Gait belt Activity Tolerance: Patient limited by fatigue Patient left: in bed;with call bell/phone within reach;with bed alarm set;with nursing/sitter in room Nurse Communication: Mobility status PT Visit Diagnosis: Muscle weakness (generalized) (M62.81);Difficulty in walking, not elsewhere classified (R26.2)     Time: 3664-4034 PT Time Calculation (min) (ACUTE ONLY): 20 min  Charges:  $Therapeutic Activity: 8-22 mins        Ramond Dial 05/29/2022, 2:21 PM  Carmen Blackwell, PT PhD Acute Rehab Dept. Number: [facility] [facility] [place]

## 2022-05-29 NOTE — Progress Notes (Signed)
Discharge instructions given. Patient verbalized understanding and all questions were answered.  PTAR is taking patient home. PTAR called.

## 2022-06-12 DIAGNOSIS — I3139 Other pericardial effusion (noninflammatory): Secondary | ICD-10-CM

## 2022-06-14 DIAGNOSIS — I4891 Unspecified atrial fibrillation: Secondary | ICD-10-CM

## 2022-06-19 ENCOUNTER — Encounter: Payer: Self-pay | Admitting: Cardiology

## 2022-11-27 DEATH — deceased
